# Patient Record
Sex: Female | Born: 1960
Health system: Southern US, Community
[De-identification: ages and names within clinical notes are randomized; demographics above are authoritative.]

## PROBLEM LIST (undated history)

## (undated) DIAGNOSIS — R519 Headache, unspecified: Secondary | ICD-10-CM

## (undated) DIAGNOSIS — R51 Headache: Secondary | ICD-10-CM

## (undated) DIAGNOSIS — J40 Bronchitis, not specified as acute or chronic: Secondary | ICD-10-CM

## (undated) DIAGNOSIS — T4145XA Adverse effect of unspecified anesthetic, initial encounter: Secondary | ICD-10-CM

## (undated) DIAGNOSIS — M109 Gout, unspecified: Secondary | ICD-10-CM

## (undated) DIAGNOSIS — IMO0001 Reserved for inherently not codable concepts without codable children: Secondary | ICD-10-CM

## (undated) DIAGNOSIS — K219 Gastro-esophageal reflux disease without esophagitis: Secondary | ICD-10-CM

## (undated) DIAGNOSIS — I10 Essential (primary) hypertension: Secondary | ICD-10-CM

## (undated) DIAGNOSIS — J3089 Other allergic rhinitis: Secondary | ICD-10-CM

## (undated) DIAGNOSIS — T8859XA Other complications of anesthesia, initial encounter: Secondary | ICD-10-CM

## (undated) DIAGNOSIS — M1990 Unspecified osteoarthritis, unspecified site: Secondary | ICD-10-CM

## (undated) DIAGNOSIS — R011 Cardiac murmur, unspecified: Secondary | ICD-10-CM

## (undated) HISTORY — DX: Unspecified osteoarthritis, unspecified site: M19.90

## (undated) HISTORY — PX: ABDOMINAL HYSTERECTOMY: SHX81

## (undated) HISTORY — PX: CYST REMOVAL HAND: SHX6279

## (undated) HISTORY — PX: NASAL SINUS SURGERY: SHX719

## (undated) HISTORY — DX: Gout, unspecified: M10.9

---

## 2000-04-21 ENCOUNTER — Other Ambulatory Visit: Admission: RE | Admit: 2000-04-21 | Discharge: 2000-04-21 | Payer: Self-pay | Admitting: Obstetrics

## 2000-04-21 ENCOUNTER — Encounter (INDEPENDENT_AMBULATORY_CARE_PROVIDER_SITE_OTHER): Payer: Self-pay | Admitting: Specialist

## 2000-04-30 ENCOUNTER — Ambulatory Visit (HOSPITAL_COMMUNITY): Admission: RE | Admit: 2000-04-30 | Discharge: 2000-04-30 | Payer: Self-pay | Admitting: Obstetrics

## 2000-04-30 ENCOUNTER — Encounter: Payer: Self-pay | Admitting: Obstetrics

## 2000-05-05 ENCOUNTER — Encounter: Payer: Self-pay | Admitting: Obstetrics

## 2000-05-05 ENCOUNTER — Encounter: Admission: RE | Admit: 2000-05-05 | Discharge: 2000-05-05 | Payer: Self-pay | Admitting: Obstetrics

## 2000-12-10 ENCOUNTER — Encounter: Payer: Self-pay | Admitting: Unknown Physician Specialty

## 2000-12-10 ENCOUNTER — Encounter: Admission: RE | Admit: 2000-12-10 | Discharge: 2000-12-10 | Payer: Self-pay | Admitting: Unknown Physician Specialty

## 2001-05-06 ENCOUNTER — Ambulatory Visit (HOSPITAL_COMMUNITY): Admission: RE | Admit: 2001-05-06 | Discharge: 2001-05-06 | Payer: Self-pay | Admitting: Unknown Physician Specialty

## 2001-05-06 ENCOUNTER — Encounter: Payer: Self-pay | Admitting: Unknown Physician Specialty

## 2002-04-13 ENCOUNTER — Encounter: Admission: RE | Admit: 2002-04-13 | Discharge: 2002-04-13 | Payer: Self-pay | Admitting: Unknown Physician Specialty

## 2002-04-13 ENCOUNTER — Encounter: Payer: Self-pay | Admitting: Unknown Physician Specialty

## 2007-05-21 ENCOUNTER — Encounter: Admission: RE | Admit: 2007-05-21 | Discharge: 2007-05-21 | Payer: Self-pay | Admitting: Otolaryngology

## 2007-06-08 ENCOUNTER — Ambulatory Visit (HOSPITAL_BASED_OUTPATIENT_CLINIC_OR_DEPARTMENT_OTHER): Admission: RE | Admit: 2007-06-08 | Discharge: 2007-06-08 | Payer: Self-pay | Admitting: Otolaryngology

## 2007-06-08 ENCOUNTER — Encounter (INDEPENDENT_AMBULATORY_CARE_PROVIDER_SITE_OTHER): Payer: Self-pay | Admitting: Otolaryngology

## 2009-07-16 ENCOUNTER — Emergency Department (HOSPITAL_COMMUNITY): Admission: EM | Admit: 2009-07-16 | Discharge: 2009-07-16 | Payer: Self-pay | Admitting: Emergency Medicine

## 2010-07-29 ENCOUNTER — Inpatient Hospital Stay (HOSPITAL_COMMUNITY)
Admission: AD | Admit: 2010-07-29 | Discharge: 2010-07-29 | Payer: Self-pay | Source: Home / Self Care | Attending: Obstetrics & Gynecology | Admitting: Obstetrics & Gynecology

## 2010-08-01 ENCOUNTER — Ambulatory Visit
Admission: RE | Admit: 2010-08-01 | Discharge: 2010-08-01 | Payer: Self-pay | Source: Home / Self Care | Attending: Obstetrics & Gynecology | Admitting: Obstetrics & Gynecology

## 2010-08-05 LAB — CBC
HCT: 30.2 % — ABNORMAL LOW (ref 36.0–46.0)
Hemoglobin: 10 g/dL — ABNORMAL LOW (ref 12.0–15.0)
MCH: 29.5 pg (ref 26.0–34.0)
MCHC: 33.1 g/dL (ref 30.0–36.0)
MCV: 89.1 fL (ref 78.0–100.0)
Platelets: 268 10*3/uL (ref 150–400)
RBC: 3.39 MIL/uL — ABNORMAL LOW (ref 3.87–5.11)
RDW: 12.7 % (ref 11.5–15.5)
WBC: 8.2 10*3/uL (ref 4.0–10.5)

## 2010-08-05 LAB — WET PREP, GENITAL
Clue Cells Wet Prep HPF POC: NONE SEEN
Trich, Wet Prep: NONE SEEN
Yeast Wet Prep HPF POC: NONE SEEN

## 2010-08-05 LAB — POCT PREGNANCY, URINE: Preg Test, Ur: NEGATIVE

## 2010-08-05 LAB — SAMPLE TO BLOOD BANK

## 2010-08-05 LAB — GC/CHLAMYDIA PROBE AMP, GENITAL
Chlamydia, DNA Probe: NEGATIVE
GC Probe Amp, Genital: NEGATIVE

## 2010-08-21 ENCOUNTER — Ambulatory Visit: Payer: BC Managed Care – PPO | Admitting: Obstetrics & Gynecology

## 2010-08-21 ENCOUNTER — Ambulatory Visit: Admit: 2010-08-21 | Payer: Self-pay | Admitting: Obstetrics & Gynecology

## 2010-08-21 DIAGNOSIS — D259 Leiomyoma of uterus, unspecified: Secondary | ICD-10-CM

## 2010-10-01 ENCOUNTER — Encounter (HOSPITAL_COMMUNITY)
Admission: RE | Admit: 2010-10-01 | Discharge: 2010-10-01 | Disposition: A | Discharge status: Home / Self Care | Payer: BC Managed Care – PPO | Source: Ambulatory Visit | Attending: Obstetrics & Gynecology | Admitting: Obstetrics & Gynecology

## 2010-10-01 LAB — SURGICAL PCR SCREEN: Staphylococcus aureus: POSITIVE — AB

## 2010-10-01 LAB — CBC
HCT: 35.8 % — ABNORMAL LOW (ref 36.0–46.0)
Hemoglobin: 10.8 g/dL — ABNORMAL LOW (ref 12.0–15.0)
MCH: 24.5 pg — ABNORMAL LOW (ref 26.0–34.0)
MCHC: 30.2 g/dL (ref 30.0–36.0)
MCV: 81.4 fL (ref 78.0–100.0)
Platelets: 308 10*3/uL (ref 150–400)
RBC: 4.4 MIL/uL (ref 3.87–5.11)
RDW: 16.8 % — ABNORMAL HIGH (ref 11.5–15.5)
WBC: 8.1 10*3/uL (ref 4.0–10.5)

## 2010-10-01 LAB — BASIC METABOLIC PANEL
BUN: 11 mg/dL (ref 6–23)
CO2: 29 mEq/L (ref 19–32)
Calcium: 9.2 mg/dL (ref 8.4–10.5)
Chloride: 102 mEq/L (ref 96–112)
Creatinine, Ser: 0.55 mg/dL (ref 0.4–1.2)
GFR calc Af Amer: >60 mL/min (ref >60)
GFR calc non Af Amer: >60 mL/min (ref >60)
Glucose, Bld: 90 mg/dL (ref 70–99)
Potassium: 3.1 mEq/L — ABNORMAL LOW (ref 3.5–5.1)
Sodium: 137 mEq/L (ref 135–145)

## 2010-10-08 ENCOUNTER — Inpatient Hospital Stay (HOSPITAL_COMMUNITY)
Admission: RE | Admit: 2010-10-08 | Discharge: 2010-10-10 | DRG: 359 | Disposition: A | Discharge status: Home / Self Care | Payer: BC Managed Care – PPO | Source: Ambulatory Visit | Attending: Obstetrics & Gynecology | Admitting: Obstetrics & Gynecology

## 2010-10-08 ENCOUNTER — Other Ambulatory Visit: Payer: Self-pay | Admitting: Obstetrics & Gynecology

## 2010-10-08 DIAGNOSIS — E669 Obesity, unspecified: Secondary | ICD-10-CM | POA: Diagnosis present

## 2010-10-08 DIAGNOSIS — N920 Excessive and frequent menstruation with regular cycle: Secondary | ICD-10-CM | POA: Diagnosis present

## 2010-10-08 DIAGNOSIS — D250 Submucous leiomyoma of uterus: Principal | ICD-10-CM | POA: Diagnosis present

## 2010-10-08 DIAGNOSIS — I10 Essential (primary) hypertension: Secondary | ICD-10-CM | POA: Diagnosis present

## 2010-10-08 DIAGNOSIS — Z01812 Encounter for preprocedural laboratory examination: Secondary | ICD-10-CM

## 2010-10-08 DIAGNOSIS — D279 Benign neoplasm of unspecified ovary: Secondary | ICD-10-CM | POA: Diagnosis present

## 2010-10-08 DIAGNOSIS — Z01818 Encounter for other preprocedural examination: Secondary | ICD-10-CM

## 2010-10-08 LAB — ABO/RH: ABO/RH(D): B NEG

## 2010-10-08 LAB — PREGNANCY, URINE: Preg Test, Ur: NEGATIVE

## 2010-10-09 LAB — CBC
HCT: 34.1 % — ABNORMAL LOW (ref 36.0–46.0)
Hemoglobin: 10.7 g/dL — ABNORMAL LOW (ref 12.0–15.0)
MCH: 24.9 pg — ABNORMAL LOW (ref 26.0–34.0)
MCHC: 31.4 g/dL (ref 30.0–36.0)
MCV: 79.3 fL (ref 78.0–100.0)
Platelets: 322 10*3/uL (ref 150–400)
RBC: 4.3 MIL/uL (ref 3.87–5.11)
RDW: 17.1 % — ABNORMAL HIGH (ref 11.5–15.5)
WBC: 11.8 10*3/uL — ABNORMAL HIGH (ref 4.0–10.5)

## 2010-10-11 LAB — CROSSMATCH
ABO/RH(D): B NEG
Antibody Screen: NEGATIVE
Unit division: 0
Unit division: 0

## 2010-10-11 NOTE — H&P (Signed)
[name], [name] NO.:  [id number]  MEDICAL RECORD NO.:  [id number]           PATIENT TYPE:  LOCATION:  [facility]                     FACILITY:  PHYSICIAN:  Scheryl Darter, MD       DATE OF BIRTH:  [birth year]  DATE OF SERVICE:                          PRE-OP HISTORY & PHYSICAL  The patient returns for her results of her biopsy and Pap test.  The patient is a 50 year old black female gravida 2, para 2-0-0-2, status post tubal ligation.  Last menstrual period, July 29, 2010.  The patient was seen in the MAU due to heavy bleeding.  She had light bleeding throughout December.  The patient has sensation of a mass in her lower abdomen.  Gynecologic exam little over a year ago, did not reveal any pelvic masses.  PAST MEDICAL HISTORY: 1. Hypertension. 2. Obesity.  PAST SURGICAL HISTORY:  Tubal ligation.  SOCIAL HISTORY:  The patient is a smoker.  She denies alcohol or drug use.  FAMILY HISTORY:  Hypertension and cancer.  Allergy to PENICILLIN.  MEDICATIONS: 1. Hydrochlorothiazide 25 mg a day. 2. Norvasc 10 mg a day. 3. Multivitamin.  REVIEW OF SYSTEMS:  Her bleeding is almost stopped.  She has a mass and some pain in her lower abdomen.  No nausea and vomiting, constipation or diarrhea, fever or chills.  PHYSICAL EXAMINATION:  GENERAL:  The patient in no acute distress. Normal affect. VITAL SIGNS:  Weight is 247 pounds, height 5 feet 3 inches, blood pressure 126/77, pulse 99. ABDOMEN:  Obese, soft with a firm mass up to the umbilicus consistent with fibroid uterus. EXTREMITIES:  Show some mild swelling. PELVIC:  On August 01, 2010, showed normal external genitalia and some light-to-moderate blood.  Cervix was displaced anteriorly. Pelvic exam revealed a firm mass consistent with fibroid uterus about 18-20 weeks size.  Endometrial biopsy was performed with the uterus sounding to greater than 12 cm.  Pap test done that day was normal.  The  endometrial biopsy revealed benign fragments of proliferative endometrium.  Ultrasound on July 29, 2010, showed endometrial thickening and 28 mm with a solid mass anteriorly in the myometrium measuring 16 x 12 x 12.7 cm and overall uterine measurements 20.5 x 13.6 x 16 cm.  IMPRESSION:  Dysfunctional uterine bleeding and fibroid uterus.  PLAN:  Due to the size of her fibroids, history of dysfunctional bleeding, I  recommend total abdominal hysterectomy.  I offered bilateral salpingo-oophorectomy at the same time and explained the risk and benefits of this.  She accepts TAH-BSO.  The risk of anesthesia, bleeding, infection, bowel or urinary tract damage and pain were discussed.  I also discussed the risk that she would have continued bleeding problems since surgery has not done.  Less invasive procedures were also discussed.  The patient will be scheduled for surgery in this hospital.  Questions were answered.     Scheryl Darter, MD    JA/MEDQ  D:  08/21/2010  T:  08/22/2010  Job:  [id number]

## 2010-10-14 DIAGNOSIS — Z09 Encounter for follow-up examination after completed treatment for conditions other than malignant neoplasm: Secondary | ICD-10-CM

## 2010-10-21 NOTE — Op Note (Signed)
NAMESHERELLE, CASTELLI                 ACCOUNT NO.:  [id number]  MEDICAL RECORD NO.:  [id number]           PATIENT TYPE:  I  LOCATION:  9320                          FACILITY:  [facility]  PHYSICIAN:  Scheryl Darter, MD       DATE OF BIRTH:  1961-02-27  DATE OF PROCEDURE:  10/08/2010 DATE OF DISCHARGE:                              OPERATIVE REPORT   PROCEDURE:  Total abdominal hysterectomy and bilateral salpingo- oophorectomy.  PREOPERATIVE DIAGNOSIS:  Large fibroid uterus with menorrhagia.  POSTOPERATIVE DIAGNOSIS:  Large fibroid uterus with menorrhagia.  SURGEON:  Scheryl Darter, MD  ASSISTANT:  Lesly Dukes, MD  ANESTHESIA:  General.  ESTIMATED BLOOD LOSS:  700 mL.  SPECIMENS:  Uterus, tubes, and ovaries.  Infant weight was 1532 g.  URINE OUTPUT:  250 mL intraoperatively.  DRAINS:  Foley catheter.  COMPLICATIONS:  None.  COUNTS:  Correct.  OPERATIVE COURSE:  The patient gave written consent for total abdominal hysterectomy and bilateral salpingo-oophorectomy due to symptomatic fibroid uterus with menorrhagia.  The patient identification was confirmed.  She was brought to the OR and general anesthesia was induced.  She was placed in dorsal supine position.  Exam under anesthesia revealed large pelvic mass consistent with fibroid near the umbilicus.  Abdomen was sterilely prepped and draped and Foley catheter was placed.  A #10 blade was used to make a vertical skin incision from the umbilicus to the symphysis pubis.  Incision was carried down to the fascia and the fascia was incised.  Incision was carried vertically. Underlying peritoneum was identified and entered and incision was extended vertically with Metzenbaum scissors.  Large fibroid uterus was confirmed.  The tubes and ovaries appeared normal.  The valve was packed back and the self-retaining retractor was placed.  The left round ligament was elevated and suture ligated and incised.  Anterior leaf of the broad  ligament was opened.  The infundibulopelvic ligament was isolated.  Heaney clamp was placed proximal to the pelvic ligament after opening the posterior leaf of the broad ligament.  Double ligatures with 0-Vicryl were applied after incising the pedicle.  Hemostasis was obtained with 0-Vicryl suture.  The right round ligament was likewise elevated and suture ligated and incised and bladder flap was created. Bladder was mobilized.  The posterior leaf of the broad ligament was opened and the infundibulopelvic ligament was isolated and clamped and cut and double suture ligatures were placed.  The uterine vessels were skeletonized.  These were clamped with Heaney clamps, cut, and suture ligated with 0-Vicryl.  There was backbleeding from the large uterine fibroid during this stage of the procedure.  The cardinal ligaments were then clamped, cut, and suture ligated and the bladder was further mobilized.  The uterosacral ligaments and the vaginal angles were clamped with curved Heaney and the cervix was amputated with a knife. At this point, Heaney clamps could be placed below the cervix and the cervix was excised from the upper vagina.  0-Vicryl sutures were placed at the angles.  The vaginal cuff was closed with running locking suture of 0-Vicryl.  Good hemostasis was seen.  Pelvis was irrigated.  Both ureters were seen to be peristalsing normally.  All instruments and packs were removed.  Anterior peritoneum was closed with a running suture with 2-0 Vicryl.  The fascia was closed with running suture of 0- Vicryl.  The skin incision was irrigated.  There was about 4 cm of adipose tissue.  The subcutaneous layer was closed with a running suture with 2-0 plain gut.  The skin was closed with staples.  Sterile dressing was applied.  The patient tolerated the procedure well without complications.  Estimated blood loss was 700 mL.  The patient was brought in to stable condition to recovery room.   There was clear urine draining from the Foley catheter.     Scheryl Darter, MD     JA/MEDQ  D:  10/08/2010  T:  10/09/2010  Job:  [id number]  Electronically Signed by Scheryl Darter MD on 10/21/2010 11:36:05 AM

## 2010-10-21 NOTE — Discharge Summary (Signed)
  [name], [name]                 ACCOUNT NO.:  [id number]  MEDICAL RECORD NO.:  [id number]           PATIENT TYPE:  I  LOCATION:  9320                          [facility]ILITY[facility]  [facility]  PHYSICIAN:  Scheryl Darter, MD       DATE OF BIRTH:  Jul 24, 1960  DATE OF ADMISSION:  10/08/2010 DATE OF DISCHARGE:  10/10/2010                              DISCHARGE SUMMARY   DIAGNOSIS:  Symptomatic fibroid uterus.  PROCEDURE:  On October 08, 2010, was total abdominal hysterectomy with bilateral salpingo-oophorectomy.  The patient is a 50 year old black female gravida 2, para 2, status post tubal ligation with history of fibroid uterus.  The patient has a history of heavy menstrual bleeding. She was admitted for total abdominal hysterectomy and bilateral salpingo- oophorectomy.  PAST MEDICAL HISTORY:  Hypertension and obesity.  PAST SURGICAL HISTORY:  Tubal ligation.  SOCIAL HISTORY:  The patient is married.  She denies alcohol, tobacco, or drug use.  ALLERGIES:  She has a PENICILLIN allergy.  MEDICATIONS:  Hydrochlorothiazide 25 mg a day, Norvasc 10 mg a day, and multivitamin.  PHYSICAL EXAMINATION:  VITAL SIGNS:  Weight 247 pounds, height 5 feet 3 inches, BP 126/77. CHEST:  Clear. HEART:  Regular rate and rhythm. ABDOMEN:  Obese with a firm mass to the umbilicus.  The mass was confirmed on pelvic examination.  The ultrasound showed large fibroid.  The patient was admitted for planned surgery.  Procedure was completed without complications on October 08, 2010.  The patient was found to have positive screen for methicillin-resistant staph aureus.  She was afebrile after surgery.  Preop hemoglobin was 10.8 and postop was 10.7. Second postoperative day, she was discharged home.  She was given prescription for Percocet 5/325 one to two p.o. q.4-6 hours p.r.n. pain. She is instructed to have pelvic rest, no heavy lifting.  She is not to drive for at least 2 weeks.  Instructed to return in 4 days to  remove her staples, and she is to have a postoperative visit at [facility] in 4 weeks.    Scheryl Darter, MD    JA/MEDQ  D:  10/10/2010  T:  10/11/2010  Job:  [id number]  Electronically Signed by Scheryl Darter MD on 10/21/2010 11:35:55 AM

## 2010-11-06 ENCOUNTER — Ambulatory Visit: Payer: BC Managed Care – PPO | Admitting: Obstetrics & Gynecology

## 2010-11-06 DIAGNOSIS — Z09 Encounter for follow-up examination after completed treatment for conditions other than malignant neoplasm: Secondary | ICD-10-CM

## 2010-11-07 NOTE — Group Therapy Note (Signed)
NAMELATIANA, TOMEI NO.:  [id number]  MEDICAL RECORD NO.:  [id number]           PATIENT TYPE:  [facility]ATION:  [facility]                     FACILITY:  PHYSICIAN:  Scheryl Darter, MD       DATE OF BIRTH:  [birth year]  DATE OF SERVICE:                                 CLINIC NOTE  The patient returns postoperative from a total abdominal hysterectomy and bilateral salpingo-oophorectomy that was performed on October 08, 2010.  She had a large fibroid uterus with benign pathology.  She says she does have some vasomotor symptoms and sweats.  She questions whether she would benefit from hormone replacement therapy.  PAST MEDICAL HISTORY: 1. Hypertension. 2. Obesity.  PHYSICAL EXAMINATION:  VITAL SIGNS:  Blood pressure is 151/102 and 143/94, temperature 97.8. ABDOMEN:  Obese, soft, well-healed vertical midline incision, nontender. No masses. PELVIC:  External genitalia, vagina, and vaginal cuff were well supported with good healing of the cuff.  No masses or tenderness.  IMPRESSION:  The patient is doing well 1 month postoperative from hysterectomy.  She is having vasomotor symptoms.  PLAN:  She can return to normal activities and start working on Nov 19, 2010.  I gave a prescription for Estrace 1 mg p.o. daily.  She should come for yearly exams and have breast exams and mammograms.  She should continue with her antihypertensive medications.     Scheryl Darter, MD    JA/MEDQ  D:  11/06/2010  T:  11/07/2010  Job:  [id number]

## 2010-12-03 NOTE — Assessment & Plan Note (Signed)
NAMEHAYDEN, Grant NO.:  [id number]   MEDICAL RECORD NO.:  [id number]          PATIENT TYPE:  POB   LOCATION:  [facility]         [facility]ACILITY:  [facility]   PHYSICIAN:  Scheryl Darter, MD       DATE OF BIRTH:  05/12/1961   DATE OF SERVICE:                                  CLINIC NOTE   The patient is referred from the MAU at [facility] due to heavy  bleeding and fibroid uterus.   The patient is a 50 year old black female gravida 2, para 0-0-2, status  post tubal ligation.  She presented to MAU on July 29, 2010, due to  heavy bleeding with clots and tissue for 2 days.  She was changing pad 4-  6 times a day.  Denied any pain or cramping.  She has had light bleeding  all of December.  She has heavier bleeding for about 2 weeks.  She has  noticed sensation of a mass in her lower abdomen for the past several  months.  She had last been seen for gynecologic exam little over a year  ago and she had a Pap test done, states that no pelvic mass was detected  at that time.   PAST MEDICAL HISTORY:  1. Hypertension.  2. Obesity.   PAST SURGICAL HISTORY:  Tubal ligation.   SOCIAL HISTORY:  The patient denies drug or alcohol use.  She is a  smoker.   Family history of hypertension and cancer.   He has allergies to PENICILLIN.   MEDICATIONS:  1. Hydrochlorothiazide 25 mg a day.  2. Norvasc 10 mg one p.o. daily.  3. She also takes a multivitamin.   REVIEW OF SYSTEMS:  She still has some bleeding, but it is decreased  some.  Occasional pain since she has some pressure and mass effect in  her lower abdomen.  No nausea and vomiting.  No diarrhea or  constipation.  No fever, chills.   PHYSICAL EXAMINATION:  GENERAL:  The patient in no acute distress.  Normal affect.  VITAL SIGNS:  Weight is 244 pounds, height 5 feet 3 inches, blood  pressure 137/91, pulse 94.  ABDOMEN:  Obese and nontender with a firm mass palpable above the  umbilicus consistent with  fibroid uterus.  EXTREMITIES:  Show some mild swelling.  PELVIC:  External genitalia appeared normal.  The patient has light to  moderate blood.  No clots.  Cervix was displaced anteriorly.  Pelvic  exam reveals pelvic masses described nontender.  Pap smear was obtained.  The patient gave consent for endometrial biopsy.   Procedure was explained.  The risk of pain, bleeding, infection, or  uterine damage were discussed.  She signed consent and time-out was  performed.  My last sampler was passed to greater than 12 cm and bloody  material was obtained.  This was sent to Pathology.  The patient had  ultrasound on July 29, 2010, which showed an endometrial thickening up  to 28 mm.  There was a solid mass anteriorly in the myometrium measuring  16 x 12 x 12.7 cm.  The overall uterine measurements were 20.5 x 13.2 x  16 cm.   IMPRESSION:  Dysfunctional uterine bleeding with fibroid uterus  described on ultrasound.  There was endometrial thickening.   PLAN:  We will review the results of her Pap smear and endometrial  biopsy at the next visit in 2 weeks.  I will discuss the probable  scheduling for total abdominal hysterectomy, bilateral salpingo-  oophorectomy.  Discussed the procedure and the risks.      Scheryl Darter, MD     JA/MEDQ  D:  08/01/2010  T:  08/02/2010  Job:  [id number]

## 2010-12-03 NOTE — Op Note (Signed)
NAMEJAILYNNE, [name]                 ACCOUNT NO.:  [id number]   MEDICAL RECORD NO.:  [id number]          PATIENT TYPE:  AMB   LOCATION:  [facility]                          FACILITY:  [facility]   PHYSICIAN:  Christopher E. Ezzard Standing, M.D.DATE OF BIRTH:  January 08, 1961   DATE OF PROCEDURE:  06/08/2007  DATE OF DISCHARGE:                               OPERATIVE REPORT   PREOPERATIVE DIAGNOSIS:  Sinonasal polyps, right side worse than the  left, with nasal obstruction and decreased sense of smell.   POSTOPERATIVE DIAGNOSIS:  Sinonasal polyps, right side worse than the  left, with nasal obstruction and decreased sense of smell, findings  consistent with chronic fungal sinusitis on the right side.   OPERATION PERFORMED:  Functional endoscopic sinus surgery with right  total ethmoidectomy with removal of polyps, right maxillary ostial  enlargement, removal of polyps and fungal disease, right sphenoidotomy  with removal of polyps and fungal disease.  Left posterior ethmoidectomy  with removal of polyps.   SURGEON:  Kristine Garbe. Ezzard Standing, M.D.   ANESTHESIA:  General endotracheal.   COMPLICATIONS:  None.   BRIEF CLINICAL NOTE:  [name] is a 50 year old female who has had a  long history of allergies and nasal obstruction.  Over the last several  months, she has had more trouble breathing through the right side of the  nose and has decreased sense of smell over the last year.  She underwent  a CT scan which shows complete opacification of the right ethmoid,  maxillary, frontal and sphenoid sinuses.  She has some partial  opacification of the left posterior ethmoid sphenoid region where she  has some polyps extruding from the sphenoid region posteriorly on the  left side.  She is taken to the operating room at this time for  functional endoscopic sinus surgery with removal of polypoid disease.  Findings of CT scan are suggestive of fungal polypoid sinusitis.   DESCRIPTION OF PROCEDURE:  After adequate  endotracheal anesthesia, [name]  received 10 mg Decadron and 1 gram Ancef IV preoperatively.  Her nose  was then further prepped with cotton pledgets soaked in Afrin and the  middle meatus area was injected with Xylocaine with epinephrine for  hemostasis.  The right nasal passageway was completely obstructed with  polypoid disease.  The left side was fairly clear anteriorly.  Using the  microdebrider, the polypoid disease was removed from the right side.  The polyps extended throughout the entire ethmoid region.  There was  some fungal debris with the consistency of thick peanut butter within  the ethmoid and maxillary sinuses as well as some back in the sphenoid  region.  The maxillary sinus was completely obstructed with this debris  as well as some of the superior ethmoid region.  Maxillary ostial  enlargement was performed with straight through cup forceps and back  cutting forceps.  In order to remove all the debris from the maxillary  sinus, it was copiously irrigated with saline and all the fungal debris  that could be visualized with the 70 degree scope was removed.  Likewise, the fungal debris was removed  from the roof of the ethmoid  area as well as the sphenoid sinus posteriorly.  The areas were then  irrigated with saline.  Care was taken to preserve the lamina papyracea  as well as the roof of the ethmoid.  On the left side, on review of the  CT scan, the anterior ethmoid, maxillary, and frontal sinuses were  clear.  The left sphenoid sinus was completely opacified but this was  actually approached through the large right sphenoidotomy.  On the left  side, the posterior ethmoid area was opened up by making an incision  medially through the ethmoid area and some of the polypoid disease was  removed with a microdebrider on the left side.  A limited posterior  ethmoidectomy and removal of polypoid disease was performed on the left  side.  Hemostasis was obtained with suction  cautery.  After obtaining  adequate hemostasis on the right side, a Kennedy sinus pack was placed  in the right middle meatus area and hydrated with Xylocaine with  epinephrine.  No pack was placed in the left side.  The inferior  turbinates in the posterior left ethmoid area was then injected with 40  mg of Depo-Medrol on each side.  This completed the procedure.  [name] was  awakened from anesthesia and transferred to the recovery room  postoperatively doing well.   DISPOSITION:  [name] was discharged home later this morning on Keflex 500  mg b.i.d. for ten days, Tylenol, Vicodin p.r.n. pain.  She is to follow  up in my office in three for recheck and have the Kennedy sinus pack  removed.           ______________________________  Kristine Garbe Ezzard Standing, M.D.     CEN/MEDQ  D:  06/08/2007  T:  06/08/2007  Job:  [id number]

## 2011-04-29 LAB — BASIC METABOLIC PANEL
BUN: 9
CO2: 27
Calcium: 8.9
Chloride: 105
Creatinine, Ser: 0.73
GFR calc Af Amer: >60
GFR calc non Af Amer: >60
Glucose, Bld: 88
Potassium: 3.4 — ABNORMAL LOW
Sodium: 138

## 2011-04-29 LAB — POCT HEMOGLOBIN-HEMACUE
Hemoglobin: 12.5
Operator id: 112821

## 2013-05-02 ENCOUNTER — Other Ambulatory Visit: Payer: Self-pay | Admitting: Gastroenterology

## 2013-11-22 ENCOUNTER — Ambulatory Visit: Payer: BC Managed Care – PPO | Admitting: Cardiovascular Disease

## 2014-01-15 ENCOUNTER — Emergency Department (HOSPITAL_COMMUNITY)
Admission: EM | Admit: 2014-01-15 | Discharge: 2014-01-15 | Disposition: A | Discharge status: Home / Self Care | Payer: BC Managed Care – PPO | Attending: Emergency Medicine | Admitting: Emergency Medicine

## 2014-01-15 ENCOUNTER — Encounter (HOSPITAL_COMMUNITY): Payer: Self-pay | Admitting: Emergency Medicine

## 2014-01-15 DIAGNOSIS — I10 Essential (primary) hypertension: Secondary | ICD-10-CM | POA: Insufficient documentation

## 2014-01-15 DIAGNOSIS — Z880 Allergy status to penicillin: Secondary | ICD-10-CM | POA: Insufficient documentation

## 2014-01-15 DIAGNOSIS — L02219 Cutaneous abscess of trunk, unspecified: Secondary | ICD-10-CM | POA: Insufficient documentation

## 2014-01-15 DIAGNOSIS — L0291 Cutaneous abscess, unspecified: Secondary | ICD-10-CM

## 2014-01-15 DIAGNOSIS — Z87891 Personal history of nicotine dependence: Secondary | ICD-10-CM | POA: Insufficient documentation

## 2014-01-15 DIAGNOSIS — L03319 Cellulitis of trunk, unspecified: Principal | ICD-10-CM

## 2014-01-15 DIAGNOSIS — Z8709 Personal history of other diseases of the respiratory system: Secondary | ICD-10-CM | POA: Insufficient documentation

## 2014-01-15 HISTORY — DX: Essential (primary) hypertension: I10

## 2014-01-15 HISTORY — DX: Bronchitis, not specified as acute or chronic: J40

## 2014-01-15 NOTE — ED Notes (Signed)
Patient is alert and oriented x3.  She is complaining of a cyst like area on the mid upper back. She states that it has been manipulated for the last week and that a creamy drainage came out. Currently she rates her pain 5 of 10.

## 2014-01-15 NOTE — ED Provider Notes (Signed)
CSN: [id number]     Arrival date & time 01/15/14  1301 History  This chart was scribed for non-physician practitioner, Monico Blitz, PA-C,working with Fredia Sorrow, MD, by Marlowe Kays, ED Scribe.  This patient was seen in room WTR6/WTR6 and the patient's care was started at 2:42 PM.   Chief Complaint  Patient presents with  . Cyst    mid back   The history is provided by the patient. No language interpreter was used.   HPI Comments:  [name] is a 53 y.o. obese female with h/o HTN who presents to the Emergency Department complaining of a worsening abscess on her back that has been there for approximately one month. Pt reports the area has grown in size. She states she has had her sister squeeze it and has produced pus. Pt reports that lying on the area makes the pain worse. She denies fever, nausea, and chills. She reports allergy to PCN.  Past Medical History  Diagnosis Date  . Hypertension   . Bronchitis    Past Surgical History  Procedure Laterality Date  . Abdominal hysterectomy    . Nasal sinus surgery    . Cyst removal hand     No family history on file. History  Substance Use Topics  . Smoking status: Former Research scientist (life sciences)  . Smokeless tobacco: Not on file  . Alcohol Use: No   OB History   Grav Para Term Preterm Abortions TAB SAB Ect Mult Living                 Review of Systems A complete 10 system review of systems was obtained and all systems are negative except as noted in the HPI and PMH.   Allergies  Penicillins  Home Medications   Prior to Admission medications   Not on File   Triage Vitals: BP 165/78  Pulse 89  Temp(Src) 98.3 F (36.8 C) (Oral)  Resp 20  Ht 5\' 3"  (1.6 m)  Wt 280 lb (127.007 kg)  BMI 49.61 kg/m2  SpO2 97% Physical Exam  Nursing note and vitals reviewed. Constitutional: She is oriented to person, place, and time. She appears well-developed and well-nourished. No distress.  HENT:  Head: Normocephalic and atraumatic.   Eyes: Conjunctivae and EOM are normal.  Cardiovascular: Normal rate, regular rhythm and intact distal pulses.   Pulmonary/Chest: Effort normal and breath sounds normal. No stridor.  Abdominal: Soft.  Musculoskeletal: Normal range of motion.  Neurological: She is alert and oriented to person, place, and time.  Skin:     Psychiatric: She has a normal mood and affect.    ED Course  Procedures (including critical care time) DIAGNOSTIC STUDIES: Oxygen Saturation is 97% on RA, normal by my interpretation.   COORDINATION OF CARE: 2:46 PM- Will I & D abscess. Pt verbalizes understanding and agrees to plan.  INCISION AND DRAINAGE PROCEDURE NOTE: Patient identification was confirmed and verbal consent was obtained. This procedure was performed by Monico Blitz, PA-C at 3:07 PM. Site: upper back Sterile procedures observed Needle size: 25 G Anesthetic used (type and amt): Lidocaine 2% with Epinephrine (1.5 mL) Blade size: #11 Drainage: large, purulent Complexity: Complex Packing used: none Site anesthetized, incision made over site, wound drained and explored loculations, rinsed with copious amounts of normal saline, wound packed with sterile gauze, covered with dry, sterile dressing.  Pt tolerated procedure well without complications.  Instructions for care discussed verbally and pt provided with additional written instructions for homecare and f/u.  Medications - No data to display  Labs Review Labs Reviewed - No data to display  Imaging Review No results found.   EKG Interpretation None      MDM   Final diagnoses:  Abscess    Filed Vitals:   01/15/14 1309 01/15/14 1533  BP: 165/78 133/74  Pulse: 89 62  Temp: 98.3 F (36.8 C) 98.3 F (36.8 C)  TempSrc: Oral Oral  Resp: 20 20  Height: 5\' 3"  (1.6 m)   Weight: 280 lb (127.007 kg)   SpO2: 97% 95%    Medications - No data to display  [name] is a 53 y.o. female presenting with an abscess to  upper back, I and D. performed with significant amount of drainage. No surrounding cellulitis. No indication for antibiotics  Evaluation does not show pathology that would require ongoing emergent intervention or inpatient treatment. Pt is hemodynamically stable and mentating appropriately. Discussed findings and plan with patient/guardian, who agrees with care plan. All questions answered. Return precautions discussed and outpatient follow up given.   I personally performed the services described in this documentation, which was scribed in my presence. The recorded information has been reviewed and is accurate.    Monico Blitz, PA-C 01/15/14 1555

## 2014-01-15 NOTE — Discharge Instructions (Signed)
Do not hesitate to return to the Emergency Department for any new, worsening or concerning symptoms.   If you do not have a primary care doctor you can establish one at the   [facility]: [place] [id number] [phone]  After you establish care. Let them know you were seen in the emergency room. They must obtain records for further management.   If you see signs of infection (warmth, redness, tenderness, pus, sharp increase in pain, fever, red streaking) immediately return to the emergency department.  Abscess Care After An abscess (also called a boil or furuncle) is an infected area that contains a collection of pus. Signs and symptoms of an abscess include pain, tenderness, redness, or hardness, or you may feel a moveable soft area under your skin. An abscess can occur anywhere in the body. The infection may spread to surrounding tissues causing cellulitis. A cut (incision) by the surgeon was made over your abscess and the pus was drained out. Gauze may have been packed into the space to provide a drain that will allow the cavity to heal from the inside outwards. The boil may be painful for 5 to 7 days. Most people with a boil do not have high fevers. Your abscess, if seen early, may not have localized, and may not have been lanced. If not, another appointment may be required for this if it does not get better on its own or with medications. HOME CARE INSTRUCTIONS   Only take over-the-counter or prescription medicines for pain, discomfort, or fever as directed by your caregiver.  When you bathe, soak and then remove gauze or iodoform packs at least daily or as directed by your caregiver. You may then wash the wound gently with mild soapy water. Repack with gauze or do as your caregiver directs. SEEK IMMEDIATE MEDICAL CARE IF:   You develop increased pain, swelling, redness, drainage, or bleeding in the wound site.  You develop signs of generalized  infection including muscle aches, chills, fever, or a general ill feeling.  An oral temperature above 102 F (38.9 C) develops, not controlled by medication. See your caregiver for a recheck if you develop any of the symptoms described above. If medications (antibiotics) were prescribed, take them as directed. Document Released: 01/23/2005 Document Revised: 09/29/2011 Document Reviewed: 09/20/2007 [facility] Patient Information 2015 [place], [place]. This information is not intended to replace advice given to you by your health care provider. Make sure you discuss any questions you have with your health care provider.

## 2014-01-15 NOTE — ED Notes (Signed)
Pt states she noticed a painful raised area on her back @ 47month ago.  Pt states the area has gotten progressively larger and more painful over past 2 weeks.  Pt denies N/V and fever.  Pt has raised area mid-way down back between shoulder blades.  No drainage noted.  Pt states if she expresses area, she does get drainage.

## 2014-01-17 NOTE — ED Provider Notes (Signed)
Medical screening examination/treatment/procedure(s) were performed by non-physician practitioner and as supervising physician I was immediately available for consultation/collaboration.   EKG Interpretation None        Fredia Sorrow, MD 01/17/14 [id number]

## 2014-01-19 ENCOUNTER — Ambulatory Visit (INDEPENDENT_AMBULATORY_CARE_PROVIDER_SITE_OTHER): Payer: BC Managed Care – PPO | Admitting: Internal Medicine

## 2014-01-19 ENCOUNTER — Encounter: Payer: Self-pay | Admitting: Internal Medicine

## 2014-01-19 ENCOUNTER — Ambulatory Visit (INDEPENDENT_AMBULATORY_CARE_PROVIDER_SITE_OTHER)
Admission: RE | Admit: 2014-01-19 | Discharge: 2014-01-19 | Disposition: A | Discharge status: Home / Self Care | Payer: BC Managed Care – PPO | Source: Ambulatory Visit | Attending: Internal Medicine | Admitting: Internal Medicine

## 2014-01-19 ENCOUNTER — Other Ambulatory Visit (INDEPENDENT_AMBULATORY_CARE_PROVIDER_SITE_OTHER): Payer: BC Managed Care – PPO

## 2014-01-19 VITALS — BP 108/74 | HR 70 | Temp 98.0°F | Ht 63.0 in | Wt 291.0 lb

## 2014-01-19 DIAGNOSIS — R0600 Dyspnea, unspecified: Secondary | ICD-10-CM

## 2014-01-19 DIAGNOSIS — R0989 Other specified symptoms and signs involving the circulatory and respiratory systems: Secondary | ICD-10-CM

## 2014-01-19 DIAGNOSIS — R0609 Other forms of dyspnea: Secondary | ICD-10-CM

## 2014-01-19 DIAGNOSIS — I10 Essential (primary) hypertension: Secondary | ICD-10-CM | POA: Insufficient documentation

## 2014-01-19 LAB — BASIC METABOLIC PANEL
BUN: 12 mg/dL (ref 6–23)
CO2: 31 mEq/L (ref 19–32)
Calcium: 9.3 mg/dL (ref 8.4–10.5)
Chloride: 101 mEq/L (ref 96–112)
Creatinine, Ser: 0.8 mg/dL (ref 0.4–1.2)
GFR: 96.18 mL/min (ref >60.00)
Glucose, Bld: 111 mg/dL — ABNORMAL HIGH (ref 70–99)
Potassium: 3.5 mEq/L (ref 3.5–5.1)
Sodium: 139 mEq/L (ref 135–145)

## 2014-01-19 LAB — CBC WITH DIFFERENTIAL/PLATELET
Basophils Absolute: 0 10*3/uL (ref 0.0–0.1)
Basophils Relative: 0.3 % (ref 0.0–3.0)
Eosinophils Absolute: 0.3 10*3/uL (ref 0.0–0.7)
Eosinophils Relative: 3.3 % (ref 0.0–5.0)
HCT: 38.6 % (ref 36.0–46.0)
Hemoglobin: 13 g/dL (ref 12.0–15.0)
Lymphocytes Relative: 22.4 % (ref 12.0–46.0)
Lymphs Abs: 2 10*3/uL (ref 0.7–4.0)
MCHC: 33.7 g/dL (ref 30.0–36.0)
MCV: 86.3 fl (ref 78.0–100.0)
Monocytes Absolute: 0.6 10*3/uL (ref 0.1–1.0)
Monocytes Relative: 6.7 % (ref 3.0–12.0)
Neutro Abs: 5.9 10*3/uL (ref 1.4–7.7)
Neutrophils Relative %: 67.3 % (ref 43.0–77.0)
Platelets: 267 10*3/uL (ref 150.0–400.0)
RBC: 4.47 Mil/uL (ref 3.87–5.11)
RDW: 13.2 % (ref 11.5–15.5)
WBC: 8.8 10*3/uL (ref 4.0–10.5)

## 2014-01-19 LAB — BRAIN NATRIURETIC PEPTIDE: Pro B Natriuretic peptide (BNP): 64 pg/mL (ref 0.0–100.0)

## 2014-01-19 LAB — TSH: TSH: 1.61 u[IU]/mL (ref 0.35–4.50)

## 2014-01-19 MED ORDER — FAMOTIDINE 20 MG PO TABS: ORAL_TABLET | ORAL | Status: DC

## 2014-01-19 MED ORDER — PANTOPRAZOLE SODIUM 40 MG PO TBEC: 40.0000 mg | DELAYED_RELEASE_TABLET | Freq: Every day | ORAL | Status: DC

## 2014-01-19 MED ORDER — TELMISARTAN-HCTZ 80-12.5 MG PO TABS: 1.0000 | ORAL_TABLET | Freq: Every day | ORAL | Status: DC

## 2014-01-19 MED ORDER — METHYLPREDNISOLONE ACETATE 80 MG/ML IJ SUSP
80.0000 mg | Freq: Once | INTRAMUSCULAR | Status: AC
  Administered 2014-01-19: 80 mg via INTRAMUSCULAR

## 2014-01-19 MED ORDER — NEBIVOLOL HCL 5 MG PO TABS: ORAL_TABLET | ORAL | Status: DC

## 2014-01-19 NOTE — Patient Instructions (Signed)
Stop atenolol and norvasc (amlodipine) and your leg swelling should gradually improve   Start micardis 80/12.5 one daily  Start bystolic 5 mg 2 every 12 hours  Depomedrol 120 mg  Today should take care of any allergy contributing to your symptoms  Pantoprazole (protonix) 40 mg   Take 30-60 min before first meal of the day and Pepcid 20 mg one bedtime until return to office - this is the best way to tell whether stomach acid is contributing to your problem.    GERD (REFLUX)  is an extremely common cause of respiratory symptoms, many times with no significant heartburn at all.    It can be treated with medication, but also with lifestyle changes including avoidance of late meals, excessive alcohol, smoking cessation, and avoid fatty foods, chocolate, peppermint, colas, red wine, and acidic juices such as orange juice.  NO MINT OR MENTHOL PRODUCTS SO NO COUGH DROPS  USE SUGARLESS CANDY INSTEAD (jolley ranchers or Stover's)  NO OIL BASED VITAMINS - use powdered substitutes.    See Tammy NP w/in 2 weeks with all your medications and a list of your insurance companies preferred blood pressure medications ("formulary")

## 2014-01-19 NOTE — Progress Notes (Signed)
Subjective:    Patient ID: [name], female    DOB: 1961-01-05  MRN: [id number]  HPI  45 yobf quit smoking march 2015 with h/o allergies as child required shots and some inhalers 100% better maybe around HS then around mid 20's recurrent problems coughed attributed to sinus / bronchitis s/p sinus surgery 2004 by Lucia Gaskins some better then short of breath starting May 2015 self-referred to pulmonary clinic 01/19/2014    01/19/2014 1st [facility] office visit/ Linden Tagliaferro  Chief Complaint  Patient presents with  . Pulmonary Consult    Self referral.  Pt c/o SOB with or without exertion for approx 6 wks. She states that she is mainly SOB with exertion such as walking short distances such as from lobby to exam room this am.  She also c/o cough since "always"- prod with brown to clear, sometimes blood tinged sputum.    indolent onset sob/cough despite stopping smoking - only thing makes it better = stop moving  No obvious other patterns in day to day or daytime variabilty or assoc chronic cough or cp or chest tightness, subjective wheeze or overt sinus  symptoms. No unusual exp hx or h/o childhood pna/ asthma or knowledge of premature birth.  Sleeping ok without nocturnal  or early am exacerbation  of respiratory  c/o's or need for noct saba. Also denies any obvious fluctuation of symptoms with weather or environmental changes or other aggravating or alleviating factors except as outlined above   Current Medications, Allergies, Complete Past Medical History, Past Surgical History, Family History, and Social History were reviewed in [employer] record.              Review of Systems  Constitutional: Negative for fever, chills and unexpected weight change.  HENT: Positive for congestion and sneezing. Negative for dental problem, ear pain, nosebleeds, postnasal drip, rhinorrhea, sinus pressure, sore throat, trouble swallowing and voice change.   Eyes: Negative for visual  disturbance.  Respiratory: Positive for cough and shortness of breath. Negative for choking.   Cardiovascular: Negative for chest pain and leg swelling.  Gastrointestinal: Negative for vomiting, abdominal pain and diarrhea.  Genitourinary: Negative for difficulty urinating.       Acid heartburn  Indigestion  Musculoskeletal: Positive for arthralgias.  Skin: Positive for rash.  Neurological: Positive for headaches. Negative for tremors and syncope.  Hematological: Does not bruise/bleed easily.       Objective:   Physical Exam  Wt Readings from Last 3 Encounters:  01/19/14 291 lb (131.997 kg)  01/15/14 280 lb (127.007 kg)     amb bf laughs a lot  HEENT: nl dentition, turbinates, and orophanx. Nl external ear canals without cough reflex   NECK :  without JVD/Nodes/TM/ nl carotid upstrokes bilaterally   LUNGS: no acc muscle use, clear to A and P bilaterally without cough on insp or exp maneuvers   CV:  RRR  no s3 or murmur or increase in P2,  1+ pitting bilateral lower ext  edema   ABD:  soft and nontender with nl excursion in the supine position. No bruits or organomegaly, bowel sounds nl  MS:  warm without deformities, calf tenderness, cyanosis or clubbing  SKIN: warm and dry without lesions    NEURO:  alert, approp, no deficits        CXR  01/19/2014 :  No acute cardiopulmonary abnormality seen.    Recent Labs Lab 01/19/14 1018  NA 139  K 3.5  CL 101  CO2 31  BUN 12  CREATININE 0.8  GLUCOSE 111*    Recent Labs Lab 01/19/14 1018  HGB 13.0  HCT 38.6  WBC 8.8  PLT 267.0     Lab Results  Component Value Date   TSH 1.61 01/19/2014    Lab Results  Component Value Date   PROBNP 64.0 01/19/2014         Assessment & Plan:

## 2014-01-20 NOTE — Assessment & Plan Note (Signed)
Trial off amlodipine 01/19/2014 due to leg swelling and off atenolol due to ? Asthma  rx micardis 44/01.0 and bystolic 10 bid   F/u in 2 weeks

## 2014-01-20 NOTE — Assessment & Plan Note (Addendum)
-   01/19/2014  Walked RA x 3 laps @ 185 ft each stopped due to  End of study, no desats, some coughing  -  01/19/2014 nl spirometry except for [id number] ? Significance? rec > - trial off atenolol  01/19/2014   Symptoms are markedly disproportionate to objective findings and not clear this is a lung problem but pt does appear to have difficult airway management issues. DDX of  difficult airways management all start with A and  include Adherence, Ace Inhibitors, Acid Reflux, Active Sinus Disease, Alpha 1 Antitripsin deficiency, Anxiety masquerading as Airways dz,  ABPA,  allergy(esp in young), Aspiration (esp in elderly), Adverse effects of DPI,  Active smokers, plus two Bs  = Bronchiectasis and Beta blocker use..and one C= CHF  Adherence is always the initial "prime suspect" and is a multilayered concern that requires a "trust but verify" approach in every patient - starting with knowing how to use medications, especially inhalers, correctly, keeping up with refills and understanding the fundamental difference between maintenance and prns vs those medications only taken for a very short course and then stopped and not refilled.  - needs med rec >  To keep things simple, I have asked the patient to first separate medicines that are perceived as maintenance, that is to be taken daily "no matter what", from those medicines that are taken on only on an as-needed basis and I have given the patient examples of both, and then return to see our NP to generate a  detailed  medication calendar which should be followed until the next physician sees the patient and updates it.    ? Acid (or non-acid) GERD > always difficult to exclude as up to 75% of pts in some series report no assoc GI/ Heartburn symptoms> rec max (24h)  acid suppression and diet restrictions/ reviewed and instructions given in writing.   ? Allergy > depomedrol 120 mg IM x one today   ? BB effects > try on more selective BB (see hbp)

## 2014-02-02 ENCOUNTER — Ambulatory Visit (INDEPENDENT_AMBULATORY_CARE_PROVIDER_SITE_OTHER): Payer: BC Managed Care – PPO | Admitting: Adult Health

## 2014-02-02 ENCOUNTER — Encounter: Payer: Self-pay | Admitting: Adult Health

## 2014-02-02 VITALS — BP 128/74 | HR 92 | Temp 97.6°F | Ht 63.0 in | Wt 287.4 lb

## 2014-02-02 DIAGNOSIS — R0989 Other specified symptoms and signs involving the circulatory and respiratory systems: Secondary | ICD-10-CM

## 2014-02-02 DIAGNOSIS — R0609 Other forms of dyspnea: Secondary | ICD-10-CM

## 2014-02-02 DIAGNOSIS — R0600 Dyspnea, unspecified: Secondary | ICD-10-CM

## 2014-02-02 MED ORDER — NEBIVOLOL HCL 5 MG PO TABS: 5.0000 mg | ORAL_TABLET | Freq: Two times a day (BID) | ORAL | Status: DC

## 2014-02-02 MED ORDER — TELMISARTAN-HCTZ 80-12.5 MG PO TABS: 1.0000 | ORAL_TABLET | Freq: Every day | ORAL | Status: DC

## 2014-02-02 NOTE — Assessment & Plan Note (Addendum)
Dyspnea suspect, is multifactorial, with obesity, medication side effects, and mild reactive airways referred by reflux, and postnasal drip. Patient symptoms are improved with her recent medication changes and trigger . Prevention Patient's medications were reviewed today and patient education was given. Computerized medication calendar was adjusted/completed   Plan  Continue on current regimen. Follow med calendar closely and bring to each visit.  Patient is to followup with Dr. Melvyn Novas in 6 weeks and as needed.

## 2014-02-02 NOTE — Progress Notes (Signed)
Subjective:    Patient ID: [name], female    DOB: 1960-07-28  MRN: [id number]  HPI  24 yobf quit smoking march 2015 with h/o allergies as child required shots and some inhalers 100% better maybe around HS then around mid 20's recurrent problems coughed attributed to sinus / bronchitis s/p sinus surgery 2004 by Lucia Gaskins some better then short of breath starting May 2015 self-referred to pulmonary clinic 01/19/2014    01/19/2014 1st [facility] office visit/ Wert  Chief Complaint  Patient presents with  . Pulmonary Consult    Self referral.  Pt c/o SOB with or without exertion for approx 6 wks. She states that she is mainly SOB with exertion such as walking short distances such as from lobby to exam room this am.  She also c/o cough since "always"- prod with brown to clear, sometimes blood tinged sputum.    indolent onset sob/cough despite stopping smoking - only thing makes it better = stop moving >>Stop atenolol and norvasc (amlodipine) and your leg swelling should gradually improve   Start micardis 80/12.5 one daily  Start bystolic 5 mg 2 every 12 hours  Depomedrol 120 mg  Today should take care of any allergy contributing to your symptoms   02/02/2014 Follow up and Med Review  Patient returns for a two-week followup and medication review. Reviewed all her medications and organized them into a medication calendar with patient education. Appears the patient is taking. Her medication currently.(except taking bystolic 5mg  Twice daily  )  Patient was seen 2 weeks ago for initial coronary consult for shortness, of breath. Patient was changed offer for medications, Norvasc and atenolol. Patient says that her lower extremity swelling. Has resolved totally. Patient says her shortness breath is also improved and has been able to exercise. Patient denies any cough, months is, orthopnea, PND, or increased leg swelling Chest x-ray last visit was unremarkable. BNP was normal.   Current  Medications, Allergies, Complete Past Medical History, Past Surgical History, Family History, and Social History were reviewed in [employer] record.              Review of Systems  Constitutional:   No  weight loss, night sweats,  Fevers, chills, +fatigue, or  lassitude.  HEENT:   No headaches,  Difficulty swallowing,  Tooth/dental problems, or  Sore throat,                No sneezing, itching, ear ache,  +nasal congestion, post nasal drip,   CV:  No chest pain,  Orthopnea, PND, swelling in lower extremities, anasarca, dizziness, palpitations, syncope.   GI  No heartburn, indigestion, abdominal pain, nausea, vomiting, diarrhea, change in bowel habits, loss of appetite, bloody stools.   Resp: .  No chest wall deformity  Skin: no rash or lesions.  GU: no dysuria, change in color of urine, no urgency or frequency.  No flank pain, no hematuria   MS:  No joint pain or swelling.  No decreased range of motion.  No back pain.  Psych:  No change in mood or affect. No depression or anxiety.  No memory loss.          Objective:   Physical Exam  HEENT: nl dentition, turbinates, and orophanx. Nl external ear canals without cough reflex   NECK :  without JVD/Nodes/TM/ nl carotid upstrokes bilaterally   LUNGS: no acc muscle use, clear to A and P bilaterally without cough on insp or exp maneuvers  CV:  RRR  no s3 or murmur or increase in P2,  No edema   ABD:  soft and nontender with nl excursion in the supine position. No bruits or organomegaly, bowel sounds nl  MS:  warm without deformities, calf tenderness, cyanosis or clubbing  SKIN: warm and dry without lesions    NEURO:  alert, approp, no deficits        CXR  01/19/2014 :  No acute cardiopulmonary abnormality seen.    Recent Labs Lab 01/19/14 1018  NA 139  K 3.5  CL 101  CO2 31  BUN 12  CREATININE 0.8  GLUCOSE 111*    Recent Labs Lab 01/19/14 1018  HGB 13.0  HCT 38.6  WBC  8.8  PLT 267.0     Lab Results  Component Value Date   TSH 1.61 01/19/2014    Lab Results  Component Value Date   PROBNP 64.0 01/19/2014         Assessment & Plan:

## 2014-02-02 NOTE — Patient Instructions (Signed)
Continue on current regimen. Follow med calendar closely and bring to each visit.  Patient is to followup with Dr. Melvyn Novas in 6 weeks and as needed

## 2014-02-03 ENCOUNTER — Telehealth: Payer: Self-pay | Admitting: Internal Medicine

## 2014-02-03 MED ORDER — NEBIVOLOL HCL 5 MG PO TABS: 5.0000 mg | ORAL_TABLET | Freq: Two times a day (BID) | ORAL | Status: DC

## 2014-02-03 NOTE — Addendum Note (Signed)
Addended by: Parke Poisson E on: 02/03/2014 12:38 PM   Modules accepted: Orders

## 2014-02-03 NOTE — Telephone Encounter (Signed)
Called and spoke to pt. Pt stated [facility] never received the Bystolic order that was sent 02/02/14. Called [facility] and spoke to [place] and she confirmed the order never came in. Spoke to [employer], Software engineer, and gave verbal order. Called and made pt aware. Nothing further needed.

## 2014-02-21 ENCOUNTER — Telehealth: Payer: Self-pay | Admitting: Internal Medicine

## 2014-02-21 NOTE — Telephone Encounter (Signed)
Fine to try off but needs to be sure bp ok off it > have her see tammy NP if not other way to check it p off x a week

## 2014-02-21 NOTE — Telephone Encounter (Signed)
Called spoke with pt. She believes her medication bystolic 5 mg and micardis 80-12.5 mg is causing her to have HA. Reports she ran out of bystolic and did not take it for couple days and HA went away. When she got is refilled the HA came back. Requesting further recs. Please advise thanks  Allergies  Allergen Reactions  . Penicillins Hives

## 2014-02-21 NOTE — Telephone Encounter (Signed)
Aware of recs. Pt scheduled appt 03/06/14. Nothing further needed

## 2014-03-06 ENCOUNTER — Ambulatory Visit: Payer: BC Managed Care – PPO | Admitting: Adult Health

## 2014-03-07 ENCOUNTER — Encounter: Payer: Self-pay | Admitting: Adult Health

## 2014-03-07 ENCOUNTER — Encounter (INDEPENDENT_AMBULATORY_CARE_PROVIDER_SITE_OTHER): Payer: Self-pay

## 2014-03-07 ENCOUNTER — Ambulatory Visit (INDEPENDENT_AMBULATORY_CARE_PROVIDER_SITE_OTHER): Payer: BC Managed Care – PPO | Admitting: Adult Health

## 2014-03-07 VITALS — BP 128/90 | HR 80 | Temp 97.8°F | Ht 63.0 in | Wt 290.2 lb

## 2014-03-07 DIAGNOSIS — R0609 Other forms of dyspnea: Secondary | ICD-10-CM

## 2014-03-07 DIAGNOSIS — I10 Essential (primary) hypertension: Secondary | ICD-10-CM

## 2014-03-07 DIAGNOSIS — R0600 Dyspnea, unspecified: Secondary | ICD-10-CM

## 2014-03-07 DIAGNOSIS — R0989 Other specified symptoms and signs involving the circulatory and respiratory systems: Secondary | ICD-10-CM

## 2014-03-07 MED ORDER — TELMISARTAN-HCTZ 80-25 MG PO TABS: 1.0000 | ORAL_TABLET | Freq: Every day | ORAL | Status: DC

## 2014-03-07 NOTE — Progress Notes (Signed)
Subjective:    Patient ID: [name], female    DOB: 12-04-1960  MRN: [id number]  HPI  60 yobf quit smoking march 2015 with h/o allergies as child required shots and some inhalers 100% better maybe around HS then around mid 20's recurrent problems coughed attributed to sinus / bronchitis s/p sinus surgery 2004 by [name] some better then short of breath starting May 2015 self-referred to pulmonary clinic 01/19/2014    01/19/2014 1st [facility] office visit/ W[facility]  Chief Complaint  Patient presents with  . Pulmonary Consult    Self referral.  Pt c/o SOB with or without exertion for approx 6 wks. She states that she is mainly SOB with exertion such as walking short distances such as from lobby to exam room this am.  She also c/o cough since "always"- prod with brown to clear, sometimes blood tinged sputum.    indolent onset sob/cough despite stopping smoking - only thing makes it better = stop moving >>Stop atenolol and norvasc (amlodipine) and your leg swelling should gradually improve   Start micardis 80/12.5 one daily  Start bystolic 5 mg 2 every 12 hours  Depomedrol 120 mg  Today should take care of any allergy contributing to your symptoms   02/02/2014 Follow up and Med Review  Patient returns for a two-week followup and medication review. Reviewed all her medications and organized them into a medication calendar with patient education. Appears the patient is taking. Her medication currently.(except taking bystolic 5mg  Twice daily  )  Patient was seen 2 weeks ago for initial  Pulmonary consult for shortness, of breath. Patient was changed offer for medications, Norvasc and atenolol. Patient says that her lower extremity swelling. Has resolved totally. Patient says her shortness breath is also improved and has been able to exercise. Patient denies any cough, months is, orthopnea, PND, or increased leg swelling Chest x-ray last visit was unremarkable. BNP was normal. >>no change     03/07/2014 Follow up  Returns for follow up for b/p  Patient was seen 7/2 for initial  consult for shortness, of breath. Patient was changed offer for medications, Norvasc and atenolol.  Patient says her shortness breath is  improved and has been able to exercise more .  Complains that she developed HA and felt this was related to Bystolic. She stopped Bystolic and HA resolved.  Does complain that ankle swells some on/off.  No fever, chest pain,orthopnea.  B/p is doing good on current regimen .     Current Medications, Allergies, Complete Past Medical History, Past Surgical History, Family History, and Social History were reviewed in [employer] record.   Review of Systems  Constitutional:   No  weight loss, night sweats,  Fevers, chills, +fatigue, or  lassitude.  HEENT:   No headaches,  Difficulty swallowing,  Tooth/dental problems, or  Sore throat,                No sneezing, itching, ear ache,  +nasal congestion, post nasal drip,   CV:  No chest pain,  Orthopnea, PND, swelling in lower extremities, anasarca, dizziness, palpitations, syncope.   GI  No heartburn, indigestion, abdominal pain, nausea, vomiting, diarrhea, change in bowel habits, loss of appetite, bloody stools.   Resp: .  No chest wall deformity  Skin: no rash or lesions.  GU: no dysuria, change in color of urine, no urgency or frequency.  No flank pain, no hematuria   MS:  No joint pain or swelling.  No decreased range of motion.  No back pain.  Psych:  No change in mood or affect. No depression or anxiety.  No memory loss.          Objective:   Physical Exam  HEENT: nl dentition, turbinates, and orophanx. Nl external ear canals without cough reflex   NECK :  without JVD/Nodes/TM/ nl carotid upstrokes bilaterally   LUNGS: no acc muscle use, clear to A and P bilaterally without cough on insp or exp maneuvers   CV:  RRR  no s3 or murmur or increase in P2,  No edema   ABD:   soft and nontender with nl excursion in the supine position. No bruits or organomegaly, bowel sounds nl  MS:  warm without deformities, calf tenderness, cyanosis or clubbing  SKIN: warm and dry without lesions    NEURO:  alert, approp, no deficits        CXR  01/19/2014 :  No acute cardiopulmonary abnormality seen.

## 2014-03-07 NOTE — Assessment & Plan Note (Signed)
Dyspnea improved w/ medicaiton changes with discontinuation of norvasc and atenolol  Spirometry with preserved lung fxn .  cxr w/ no acute process  Plan  Cont on current regimen  follow up Dr. Melvyn Novas  In 6 weeks

## 2014-03-07 NOTE — Progress Notes (Signed)
Chart and ov progress notes reviewed/ agree with a/p

## 2014-03-07 NOTE — Assessment & Plan Note (Signed)
B/p is okay on monotherapy w/ micardis will increase HCT to see if this helps Remain off atenolol and bystolic for now  follow up Dr. Melvyn Novas  In 6 weeks  If doing well will need to follow up with PCP for HTN after this

## 2014-03-07 NOTE — Patient Instructions (Signed)
Increase Micardis HCT 80/25mg  1 daily  Increase Activity as tolerated.  Follow up Dr. Melvyn Novas  In 6 weeks and As needed

## 2014-04-05 ENCOUNTER — Emergency Department (HOSPITAL_COMMUNITY): Payer: BC Managed Care – PPO

## 2014-04-05 ENCOUNTER — Emergency Department (HOSPITAL_COMMUNITY)
Admission: EM | Admit: 2014-04-05 | Discharge: 2014-04-05 | Disposition: A | Discharge status: Home / Self Care | Payer: BC Managed Care – PPO | Attending: Emergency Medicine | Admitting: Emergency Medicine

## 2014-04-05 ENCOUNTER — Encounter (HOSPITAL_COMMUNITY): Payer: Self-pay | Admitting: Emergency Medicine

## 2014-04-05 DIAGNOSIS — M5430 Sciatica, unspecified side: Secondary | ICD-10-CM | POA: Insufficient documentation

## 2014-04-05 DIAGNOSIS — Z880 Allergy status to penicillin: Secondary | ICD-10-CM | POA: Diagnosis not present

## 2014-04-05 DIAGNOSIS — Z8709 Personal history of other diseases of the respiratory system: Secondary | ICD-10-CM | POA: Diagnosis not present

## 2014-04-05 DIAGNOSIS — Z87891 Personal history of nicotine dependence: Secondary | ICD-10-CM | POA: Insufficient documentation

## 2014-04-05 DIAGNOSIS — I10 Essential (primary) hypertension: Secondary | ICD-10-CM | POA: Insufficient documentation

## 2014-04-05 DIAGNOSIS — M545 Low back pain, unspecified: Secondary | ICD-10-CM | POA: Diagnosis present

## 2014-04-05 DIAGNOSIS — Z79899 Other long term (current) drug therapy: Secondary | ICD-10-CM | POA: Diagnosis not present

## 2014-04-05 LAB — URINALYSIS, ROUTINE W REFLEX MICROSCOPIC
Bilirubin Urine: NEGATIVE
Glucose, UA: NEGATIVE mg/dL
Ketones, ur: NEGATIVE mg/dL
Nitrite: NEGATIVE
Protein, ur: NEGATIVE mg/dL
Specific Gravity, Urine: 1.017 (ref 1.005–1.030)
Urobilinogen, UA: 0.2 mg/dL (ref 0.0–1.0)
pH: 7 (ref 5.0–8.0)

## 2014-04-05 LAB — URINE MICROSCOPIC-ADD ON

## 2014-04-05 MED ORDER — NAPROXEN 500 MG PO TABS
500.0000 mg | ORAL_TABLET | Freq: Two times a day (BID) | ORAL | Status: DC
Start: 2014-04-05 — End: 2014-05-04

## 2014-04-05 NOTE — ED Provider Notes (Signed)
CSN: [id number]     Arrival date & time 04/05/14  3220 History  This chart was scribed for non-physician practitioner working with Ovid Curd R. Alvino Chapel, MD, by Jeanell Sparrow, ED Scribe. This patient was seen in room [place] and the patient's care was started at 7:52 PM.   Chief Complaint  Patient presents with  . Back Pain   The history is provided by the patient. No language interpreter was used.   HPI Comments: [name] is a 53 y.o. female who presents to the Emergency Department complaining of constant moderate lower back pain that started 3 days ago. She reports that the pain started when she was at [facility] and had to use the bathroom. She states that the pain radiates to her sides and abdomen. She reports that the pain is exacerbated from sitting to standing position and whenever she has the urgency to use the bathroom. She reports no hx of back injury or back problems except for "an almost pinched nerve". She denies any weakness, difficulty ambulating, or urinary symptoms. Denies weakness, loss of bowel/bladder function or saddle anesthesia. Denies neck stiffness, headache, rash.  Denies fever or recent procedures to back.   Past Medical History  Diagnosis Date  . Hypertension   . Bronchitis    Past Surgical History  Procedure Laterality Date  . Abdominal hysterectomy    . Nasal sinus surgery    . Cyst removal hand     No family history on file. History  Substance Use Topics  . Smoking status: Former Smoker -- 1.00 packs/day for 35 years    Types: Cigarettes    Quit date: 09/18/2013  . Smokeless tobacco: Never Used  . Alcohol Use: No   OB History   Grav Para Term Preterm Abortions TAB SAB Ect Mult Living                 Review of Systems  Genitourinary: Negative for dysuria, hematuria and difficulty urinating.  Musculoskeletal: Positive for back pain.  Neurological: Negative for weakness.    Allergies  Penicillins  Home Medications   Prior to Admission  medications   Medication Sig Start Date End Date Taking? Authorizing Provider  acetaminophen (TYLENOL) 325 MG tablet Per bottle as needed for pain/headache    Historical Provider, MD  cetirizine (ZYRTEC) 10 MG tablet Take 10 mg by mouth at bedtime as needed for allergies.    Historical Provider, MD  dextromethorphan (DELSYM) 30 MG/5ML liquid 2 tsp by mouth every 12 hours as needed for cough    Historical Provider, MD  diphenhydrAMINE (BENADRYL) 25 mg capsule Take 25 mg by mouth at bedtime as needed.    Historical Provider, MD  famotidine (PEPCID) 20 MG tablet One at bedtime 01/19/14   Tanda Rockers, MD  fluticasone [facility]) 50 MCG/ACT nasal spray Place 2 sprays into both nostrils daily as needed.  12/13/13   Historical Provider, MD  naproxen sodium (ALEVE) 220 MG tablet Per bottle as needed for joint pain    Historical Provider, MD  pantoprazole (PROTONIX) 40 MG tablet Take 1 tablet (40 mg total) by mouth daily. Take 30-60 min before first meal of the day 01/19/14   Tanda Rockers, MD  telmisartan-hydrochlorothiazide (MICARDIS HCT) 80-25 MG per tablet Take 1 tablet by mouth daily. 03/07/14   Tammy S Parrett, NP   BP 167/97  Pulse 85  Temp(Src) 98.1 F (36.7 C) (Oral)  Resp 18  SpO2 100% Physical Exam  Nursing note and vitals reviewed.  Constitutional: She is oriented to person, place, and time. She appears well-developed and well-nourished. No distress.  HENT:  Head: Normocephalic and atraumatic.  Neck: Neck supple. No tracheal deviation present.  Cardiovascular: Normal rate.   Pulmonary/Chest: Effort normal. No respiratory distress.  Musculoskeletal: Normal range of motion. She exhibits tenderness.  SI joint are TTP. TTT in sacrum and bilateral gluteus. ROM limited due to pain and tenderness. DTRs normal. NVI. Full strength, able to ambulate  Neurological: She is alert and oriented to person, place, and time.  Skin: Skin is warm and dry.  Psychiatric: She has a normal mood and affect.  Her behavior is normal.    ED Course  Procedures (including critical care time) DIAGNOSTIC STUDIES: Oxygen Saturation is 100% on RA, normal by my interpretation.    COORDINATION OF CARE: 7:56 PM- Pt advised of plan for treatment which includes radiology and pt agrees.  Labs Review Labs Reviewed - No data to display  Imaging Review No results found.   EKG Interpretation None      MDM   Final diagnoses:  Low back pain, unspecified back pain laterality, with sciatica presence unspecified   Patient with back pain.  No neurological deficits and normal neuro exam.  Patient can walk but states is painful.  No loss of bowel or bladder control.  No concern for cauda equina.  No fever, night sweats, weight loss, h/o cancer, IVDU.  RICE protocol and pain medicine indicated and discussed with patient.  ] I personally performed the services described in this documentation, which was scribed in my presence. The recorded information has been reviewed and is accurate.    Margarita Mail, PA-C 04/13/14 1028

## 2014-04-05 NOTE — Discharge Instructions (Signed)
Recommend Naproxen for pain control. Also recommend you ice your back 2-3 times per say for at least 20 minutes each time. Follow up with a primary doctor to ensure resolution of symptoms.  Back Pain, Adult Low back pain is very common. About 1 in 5 people have back pain.The cause of low back pain is rarely dangerous. The pain often gets better over time.About half of people with a sudden onset of back pain feel better in just 2 weeks. About 8 in 10 people feel better by 6 weeks.  CAUSES Some common causes of back pain include:  Strain of the muscles or ligaments supporting the spine.  Wear and tear (degeneration) of the spinal discs.  Arthritis.  Direct injury to the back. DIAGNOSIS Most of the time, the direct cause of low back pain is not known.However, back pain can be treated effectively even when the exact cause of the pain is unknown.Answering your caregiver's questions about your overall health and symptoms is one of the most accurate ways to make sure the cause of your pain is not dangerous. If your caregiver needs more information, he or she may order lab work or imaging tests (X-rays or MRIs).However, even if imaging tests show changes in your back, this usually does not require surgery. HOME CARE INSTRUCTIONS For many people, back pain returns.Since low back pain is rarely dangerous, it is often a condition that people can learn to [facility] their own.   Remain active. It is stressful on the back to sit or stand in one place. Do not sit, drive, or stand in one place for more than 30 minutes at a time. Take short walks on level surfaces as soon as pain allows.Try to increase the length of time you walk each day.  Do not stay in bed.Resting more than 1 or 2 days can delay your recovery.  Do not avoid exercise or work.Your body is made to move.It is not dangerous to be active, even though your back may hurt.Your back will likely heal faster if you return to being active  before your pain is gone.  Pay attention to your body when you bend and lift. Many people have less discomfortwhen lifting if they bend their knees, keep the load close to their bodies,and avoid twisting. Often, the most comfortable positions are those that put less stress on your recovering back.  Find a comfortable position to sleep. Use a firm mattress and lie on your side with your knees slightly bent. If you lie on your back, put a pillow under your knees.  Only take over-the-counter or prescription medicines as directed by your caregiver. Over-the-counter medicines to reduce pain and inflammation are often the most helpful.Your caregiver may prescribe muscle relaxant drugs.These medicines help dull your pain so you can more quickly return to your normal activities and healthy exercise.  Put ice on the injured area.  Put ice in a plastic bag.  Place a towel between your skin and the bag.  Leave the ice on for 15-20 minutes, 03-04 times a day for the first 2 to 3 days. After that, ice and heat may be alternated to reduce pain and spasms.  Ask your caregiver about trying back exercises and gentle massage. This may be of some benefit.  Avoid feeling anxious or stressed.Stress increases muscle tension and can worsen back pain.It is important to recognize when you are anxious or stressed and learn ways to manage it.Exercise is a great option. SEEK MEDICAL CARE IF:  You  have pain that is not relieved with rest or medicine.  You have pain that does not improve in 1 week.  You have new symptoms.  You are generally not feeling well. SEEK IMMEDIATE MEDICAL CARE IF:   You have pain that radiates from your back into your legs.  You develop new bowel or bladder control problems.  You have unusual weakness or numbness in your arms or legs.  You develop nausea or vomiting.  You develop abdominal pain.  You feel faint. Document Released: 07/07/2005 Document Revised: 01/06/2012  Document Reviewed: 11/08/2013 [facility] Patient Information 2015 [place], [place]. This information is not intended to replace advice given to you by your health care provider. Make sure you discuss any questions you have with your health care provider.

## 2014-04-05 NOTE — ED Provider Notes (Signed)
2110 - Patient care assumed from [facility], PA-C at shift change. Patient presents for low back pain. Patient has urinalysis and imaging pending. Plan discussed with Kenton Kingfisher, PA-C which includes discharge with naproxen and if workup is negative.  X-ray today shows no significant findings. No malalignment or bony deformity. Urinalysis with few bacteria. UA does show too numerous to count white blood cells. This may be secondary to contamination as patient denies urinary symptoms and nitrites are negative. Will send urine for culture. Patient stable and appropriate discharged with prescription for naproxen. Return precautions provided. Patient discharged in good condition; VSS.   Filed Vitals:   04/05/14 1908  BP: 167/97  Pulse: 85  Temp: 98.1 F (36.7 C)  TempSrc: Oral  Resp: 18  SpO2: 100%   Results for orders placed during the hospital encounter of 04/05/14  URINALYSIS, ROUTINE W REFLEX MICROSCOPIC      Result Value Ref Range   Color, Urine YELLOW  YELLOW   APPearance CLOUDY (*) CLEAR   Specific Gravity, Urine 1.017  1.005 - 1.030   pH 7.0  5.0 - 8.0   Glucose, UA NEGATIVE  NEGATIVE mg/dL   Hgb urine dipstick SMALL (*) NEGATIVE   Bilirubin Urine NEGATIVE  NEGATIVE   Ketones, ur NEGATIVE  NEGATIVE mg/dL   Protein, ur NEGATIVE  NEGATIVE mg/dL   Urobilinogen, UA 0.2  0.0 - 1.0 mg/dL   Nitrite NEGATIVE  NEGATIVE   Leukocytes, UA LARGE (*) NEGATIVE  URINE MICROSCOPIC-ADD ON      Result Value Ref Range   Squamous Epithelial / LPF MANY (*) RARE   WBC, UA TOO NUMEROUS TO COUNT  <3 WBC/hpf   RBC / HPF 0-2  <3 RBC/hpf   Bacteria, UA FEW (*) RARE   Dg Sacrum/coccyx  04/05/2014   CLINICAL DATA:  Sacrum pain for 3 days.  No known injury.  EXAM: SACRUM AND COCCYX - 2+ VIEW  COMPARISON:  None.  FINDINGS: There is no evidence of fracture or other focal bone lesions. Sacroiliac joints appear normal.  IMPRESSION: Normal exam.   Electronically Signed   By: Rozetta Nunnery M.D.   On: 04/05/2014  20:26      Antonietta Breach, PA-C 04/05/14 2120

## 2014-04-05 NOTE — ED Notes (Signed)
Pt states that she began having low back pain on Sunday.  Denies injury.  Hurts with movement.

## 2014-04-05 NOTE — Progress Notes (Signed)
  CARE MANAGEMENT ED NOTE 04/05/2014  Patient:  SCHARLENE, CATALINA   Account Number:  [id number]  Date Initiated:  04/05/2014  Documentation initiated by:  Livia Snellen  Subjective/Objective Assessment:   Patient presents to Ed with lower back pain since Sunday.     Subjective/Objective Assessment Detail:     Action/Plan:   Action/Plan Detail:   Anticipated DC Date:  04/05/2014     Status Recommendation to Physician:   Result of Recommendation:    Other ED [place]  Other  PCP issues    Choice offered to / List presented to:            Status of service:  Completed, signed off  ED Comments:   ED Comments Detail:  EDCm spoke to patient at bedside.  Patient confirmsshe does not have a pcp and has [employer].  [facility] provided patient with porinted list of pcps who accept BCBS within a ten mile radius of patient's zip code [phone].  Patient thankful for resources.  No further EDCM needs at this time.

## 2014-04-06 LAB — URINE CULTURE: Colony Count: 30000

## 2014-04-06 NOTE — ED Provider Notes (Signed)
Medical screening examination/treatment/procedure(s) were performed by non-physician practitioner and as supervising physician I was immediately available for consultation/collaboration.   EKG Interpretation None       Jasper Riling. Alvino Chapel, MD 04/06/14 7322

## 2014-04-14 NOTE — ED Provider Notes (Signed)
Medical screening examination/treatment/procedure(s) were performed by non-physician practitioner and as supervising physician I was immediately available for consultation/collaboration.   EKG Interpretation None       Jasper Riling. Alvino Chapel, MD 04/14/14 [phone]

## 2014-04-18 ENCOUNTER — Encounter: Payer: Self-pay | Admitting: Internal Medicine

## 2014-04-18 ENCOUNTER — Ambulatory Visit (INDEPENDENT_AMBULATORY_CARE_PROVIDER_SITE_OTHER): Payer: BC Managed Care – PPO | Admitting: Internal Medicine

## 2014-04-18 VITALS — BP 148/92 | HR 70 | Temp 97.8°F | Ht 63.0 in | Wt 297.8 lb

## 2014-04-18 DIAGNOSIS — R0600 Dyspnea, unspecified: Secondary | ICD-10-CM

## 2014-04-18 DIAGNOSIS — R0609 Other forms of dyspnea: Secondary | ICD-10-CM

## 2014-04-18 DIAGNOSIS — R0989 Other specified symptoms and signs involving the circulatory and respiratory systems: Secondary | ICD-10-CM

## 2014-04-18 DIAGNOSIS — I10 Essential (primary) hypertension: Secondary | ICD-10-CM

## 2014-04-18 NOTE — Assessment & Plan Note (Signed)
Trial off amlodipine 01/19/2014 due to leg swelling and off atenolol due to ? Asthma> both resolved   Still on high side but plans diet/ wt loss/ f/u with primary care so will leave well enough alone

## 2014-04-18 NOTE — Patient Instructions (Signed)
Congratulations on not smoking, this is the most important aspect of your care!   If you are satisfied with your treatment plan,  let your doctor know and he/she can either refill your medications or you can return here when your prescription runs out.     If in any way you are not 100% satisfied,  please tell us.  If 100% better, tell your friends!  Pulmonary follow up is as needed

## 2014-04-18 NOTE — Progress Notes (Signed)
Subjective:    Patient ID: [name], female    DOB: Dec 19, 1960  MRN: [id number]    Brief patient profile:  51  yobf quit smoking march 2015 with h/o allergies as child required shots and some inhalers 100% better maybe around HS then around mid 20's recurrent problems cough  attributed to sinus / bronchitis s/p sinus surgery 2004 by Lucia Gaskins some better then short of breath starting May 2015 self-referred to pulmonary clinic 01/19/2014    01/19/2014 1st [facility] office visit/ Malacki Mcphearson  Chief Complaint  Patient presents with  . Pulmonary Consult    Self referral.  Pt c/o SOB with or without exertion for approx 6 wks. She states that she is mainly SOB with exertion such as walking short distances such as from lobby to exam room this am.  She also c/o cough since "always"- prod with brown to clear, sometimes blood tinged sputum.    indolent onset sob/cough despite stopping smoking - only thing makes it better = stop moving >>Stop atenolol and norvasc (amlodipine) and your leg swelling should gradually improve  Start micardis 80/12.5 one daily  Start bystolic 5 mg 2 every 12 hours  Depomedrol 120 mg  Today should take care of any allergy contributing to your symptoms   02/02/2014 Follow up and Med Review/ NP Patient returns for a two-week followup and medication review. Reviewed all her medications and organized them into a medication calendar with patient education. Appears the patient is taking. Her medication currently.(except taking bystolic 5mg  Twice daily  )  Patient was seen 2 weeks ago for initial  Pulmonary consult for shortness, of breath. Patient was changed offer for medications, Norvasc and atenolol. Patient says that her lower extremity swelling. Has resolved totally. Patient says her shortness breath is also improved and has been able to exercise.  >>no change   03/07/2014 Follow up  Returns for follow up for b/p  Patient was seen 7/2 for initial  consult for shortness,  of breath. Patient was changed off  Norvasc and atenolol.  Patient says her shortness breath is  improved and has been able to exercise more .  Complains that she developed HA and felt this was related to Bystolic. She stopped Bystolic and HA resolved.  Does complain that ankle swells some on/off.  rec increase Micardis HCT 80/25mg  1 daily      04/18/2014 f/u ov/Ryanne Morand re: hbp/ pseudoasthma med related / no med calendar  Chief Complaint  Patient presents with  . Follow-up    Pt states that her breathing is still doing well.  No new co's today.   no longer on BB at all and doing fine.  Not limited by breathing from desired activities    No obvious day to day or daytime variabilty or assoc chronic cough or cp or chest tightness, subjective wheeze overt sinus or hb symptoms. No unusual exp hx or h/o childhood pna/ asthma or knowledge of premature birth.  Sleeping ok without nocturnal  or early am exacerbation  of respiratory  c/o's or need for noct saba. Also denies any obvious fluctuation of symptoms with weather or environmental changes or other aggravating or alleviating factors except as outlined above   Current Medications, Allergies, Complete Past Medical History, Past Surgical History, Family History, and Social History were reviewed in [employer] record.  ROS  The following are not active complaints unless bolded sore throat, dysphagia, dental problems, itching, sneezing,  nasal congestion or excess/ purulent secretions,  ear ache,   fever, chills, sweats, unintended wt loss, pleuritic or exertional cp, hemoptysis,  orthopnea pnd or leg swelling, presyncope, palpitations, heartburn, abdominal pain, anorexia, nausea, vomiting, diarrhea  or change in bowel or urinary habits, change in stools or urine, dysuria,hematuria,  rash, arthralgias, visual complaints, headache, numbness weakness or ataxia or problems with walking or coordination,  change in mood/affect or memory.            Objective:   Physical Exam   . Wt Readings from Last 3 Encounters:  04/18/14 297 lb 12.8 oz (135.081 kg)  03/07/14 290 lb 3.2 oz (131.634 kg)  02/02/14 287 lb 6.4 oz (130.364 kg)      HEENT: nl dentition, turbinates, and orophanx. Nl external ear canals without cough reflex   NECK :  without JVD/Nodes/TM/ nl carotid upstrokes bilaterally   LUNGS: no acc muscle use, clear to A and P bilaterally without cough on insp or exp maneuvers   CV:  RRR  no s3 or murmur or increase in P2,  No edema   ABD:  soft and nontender with nl excursion in the supine position. No bruits or organomegaly, bowel sounds nl  MS:  warm without deformities, calf tenderness, cyanosis or clubbing  SKIN: warm and dry without lesions    NEURO:  alert, approp, no deficits        CXR  01/19/2014 :  No acute cardiopulmonary abnormality seen.

## 2014-04-18 NOTE — Assessment & Plan Note (Signed)
-   01/19/2014  Walked RA x 3 laps @ 185 ft each stopped due to  End of study, no desats, some coughing  -  01/19/2014 nl spirometry except for [id number] ? Significance? rec > - trial off atenolol  01/19/2014 > resolved   Emphasized most important aspect of her care is control wt and stay off cigarettes, pulmonary f/u is prn

## 2014-04-25 ENCOUNTER — Other Ambulatory Visit: Payer: Self-pay | Admitting: Internal Medicine

## 2014-05-04 ENCOUNTER — Ambulatory Visit (INDEPENDENT_AMBULATORY_CARE_PROVIDER_SITE_OTHER): Payer: BC Managed Care – PPO | Admitting: Internal Medicine

## 2014-05-04 ENCOUNTER — Encounter: Payer: Self-pay | Admitting: Internal Medicine

## 2014-05-04 VITALS — BP 158/84 | HR 96 | Temp 97.9°F | Resp 16 | Ht 63.0 in | Wt 293.4 lb

## 2014-05-04 DIAGNOSIS — Z0000 Encounter for general adult medical examination without abnormal findings: Secondary | ICD-10-CM

## 2014-05-04 DIAGNOSIS — I10 Essential (primary) hypertension: Secondary | ICD-10-CM

## 2014-05-04 DIAGNOSIS — M79644 Pain in right finger(s): Secondary | ICD-10-CM

## 2014-05-04 DIAGNOSIS — R0600 Dyspnea, unspecified: Secondary | ICD-10-CM

## 2014-05-04 MED ORDER — PHENDIMETRAZINE TARTRATE ER 105 MG PO CP24: 105.0000 mg | ORAL_CAPSULE | Freq: Every day | ORAL | Status: DC

## 2014-05-04 NOTE — Assessment & Plan Note (Signed)
Will trial phendimetrazine daily for 12 week maximum. She will follow up in 1 month for check in. Counseled her about diet and exercise as an important part of her weight loss. Warned her that if she does not change her lifestyle that likely any weight loss would be regained once she stops the medicine. Spoke with her about risks and benefits of treatment with weight loss medication and possible risks of her weight.

## 2014-05-04 NOTE — Progress Notes (Signed)
Pre visit review using our clinic review tool, if applicable. No additional management support is needed unless otherwise documented below in the visit note. 

## 2014-05-04 NOTE — Progress Notes (Signed)
   Subjective:    Patient ID: [name], female    DOB: [birth year], 53 y.o.   MRN: [id number]  HPI The patient is a 53 YO female who is coming in to establish care. She has PMH of HTN and dyspnea with exertion (thought to be related to weight). She is not exercising much at all lately as she is now unemployed and taking care of a grandchild some days of the week. She is also asking about a weight loss medication that she could use to jumpstart her weight loss. She has been lower weight before and thinks that this will help her breathing and her blood pressure. She did not wish to go to the bariatric clinic. Her goal weight is 190 pounds. She is having thumb pain for the last 6 months and is wearing a wrap at this time.    Review of Systems  Constitutional: Positive for activity change and unexpected weight change. Negative for fever, appetite change and fatigue.       Exercising less and gaining weight  HENT: Negative.   Eyes: Negative.   Respiratory: Positive for shortness of breath. Negative for cough, chest tightness and wheezing.   Cardiovascular: Negative for chest pain, palpitations and leg swelling.  Gastrointestinal: Negative for abdominal pain, diarrhea, constipation and abdominal distention.  Musculoskeletal: Negative for arthralgias, back pain, gait problem and myalgias.  Skin: Negative.   Neurological: Negative for dizziness, weakness, light-headedness and headaches.      Objective:   Physical Exam  Vitals reviewed. Constitutional: She is oriented to person, place, and time. She appears well-developed and well-nourished. No distress.  Morbidly obese  HENT:  Head: Normocephalic and atraumatic.  Eyes: EOM are normal.  Neck: Normal range of motion.  Cardiovascular: Normal rate and regular rhythm.   Pulmonary/Chest: Effort normal. No respiratory distress. She has no wheezes. She has no rales.  Breath sounds diminished due to habitus  Abdominal: Soft. Bowel sounds are  normal. She exhibits no distension. There is no tenderness. There is no rebound.  Neurological: She is alert and oriented to person, place, and time. Coordination normal.  Skin: Skin is warm and dry.   Filed Vitals:   05/04/14 0838 05/04/14 0858  BP: 162/92 158/84  Pulse: 96   Temp: 97.9 F (36.6 C)   TempSrc: Oral   Resp: 16   Height: 5\' 3"  (1.6 m)   Weight: 293 lb 6.4 oz (133.085 kg)   SpO2: 97%       Assessment & Plan:

## 2014-05-04 NOTE — Assessment & Plan Note (Signed)
Spoke with patient about weight loss and exercise. She will work on it. Also advised her to go back and get mammogram as it has been several years (her estimate >5).

## 2014-05-04 NOTE — Assessment & Plan Note (Signed)
Agree that her weight is likely impacting her perception of breathlessness. She was advised to lose weight and resume exercise.

## 2014-05-04 NOTE — Assessment & Plan Note (Signed)
BP slightly high today but hold off on additional agent as she will be starting her intensive weight loss period. Follow up in 1 month for labs.

## 2014-05-04 NOTE — Patient Instructions (Signed)
We are going to start you on a medicine that helps with weight loss that is approved to be used for 12 weeks. Take 1 pill before breakfast in the morning and it lasts all day. This is a controlled substance so be careful not to lose it.   We will see you back in about 1 month to check on your weight. In addition to the medicine you should be working on your diet and exercising more. If you do not change your diet then you will likely lose weight with this medicine but then gain it back when you stop taking it.   Serving Sizes What we call a serving size today is larger than it was in the past. A 1950s fast-food burger contained little more than 1 oz of meat, and a soft drink was 8 oz (1 cup). Today, a "quarter pounder" burger is at least 4 times that amount, and a 32 or 64 oz drink is not uncommon. A possible guide for eating when trying to lose weight is to eat about half as much as you normally do. Some estimates of serving sizes are:  1 Dairy serving:Individual container of yogurt (8 oz) or piece of cheese the size of your thumb (1 oz).  1 Grain serving: 1 slice of bread or  cup pasta.  1 Meat serving: The size of a deck of cards (3 oz).  1 Fruit serving: cup canned fruit or 1 medium fruit.  1 Vegetable serving:  cup of cooked or canned vegetables.  1 Fat serving:The size of 4 stacked dimes. Experts suggest spending 1 or 2 days measuring food portions you commonly eat. This will give you better practice at estimating serving sizes, and will also show whether you are eating an appropriate amount of food to meet your weight goals. If you find that you are eating more than you thought, try measuring your food for a few days so you can "reprogram" yourself to learn what makes a healthy portion for you. SUGGESTIONS FOR CONTROL  In restaurants, share entrees, or ask the waiter to put half the entre in a box or bag before you even touch it.  Order lunch-sized portions. Many restaurants serve  4 to 6 oz of meat at lunch, compared with 8 to 10 oz at dinner.  Split dessert or skip it all together. Have a piece of fruit when you get home.  At home, use smaller plates and bowls. It will look as if you are eating more.  Plate your food in the kitchen rather than serving it "family style" at the table.  Wait 20 to 30 minutes before taking seconds. This is how long it takes your brain to recognize that you are full.  Check food labels for serving sizes. Eat 1 serving only.  Use measuring cups and spoons to see proper serving sizes.  Buy smaller packages of candy, popcorn, and snacks.  Avoid eating directly out of the bag or carton.  While eating half as much, exercise twice as much. Park further away from the mall, take the stairs instead of the escalator, and walk around your block. Losing weight is a slow, difficult process. It takes long-lasting lifestyle changes. You can make gradual changes over time so they become habits. Look to friends and family to support the healthy changes you are making. Avoid fad diets since they are often only temporary weight loss solutions. Document Released: 04/05/2003 Document Revised: 09/29/2011 Document Reviewed: 10/04/2013 [facility] Patient Information 2015 [place], [place].  This information is not intended to replace advice given to you by your health care provider. Make sure you discuss any questions you have with your health care provider.   Exercise to Lose Weight Exercise and a healthy diet may help you lose weight. Your doctor may suggest specific exercises. EXERCISE IDEAS AND TIPS  Choose low-cost things you enjoy doing, such as walking, bicycling, or exercising to workout videos.  Take stairs instead of the elevator.  Walk during your lunch break.  Park your car further away from work or school.  Go to a gym or an exercise class.  Start with 5 to 10 minutes of exercise each day. Build up to 30 minutes of exercise 4 to 6 days a  week.  Wear shoes with good support and comfortable clothes.  Stretch before and after working out.  Work out until you breathe harder and your heart beats faster.  Drink extra water when you exercise.  Do not do so much that you hurt yourself, feel dizzy, or get very short of breath. Exercises that burn about 150 calories:  Running 1  miles in 15 minutes.  Playing volleyball for 45 to 60 minutes.  Washing and waxing a car for 45 to 60 minutes.  Playing touch football for 45 minutes.  Walking 1  miles in 35 minutes.  Pushing a stroller 1  miles in 30 minutes.  Playing basketball for 30 minutes.  Raking leaves for 30 minutes.  Bicycling 5 miles in 30 minutes.  Walking 2 miles in 30 minutes.  Dancing for 30 minutes.  Shoveling snow for 15 minutes.  Swimming laps for 20 minutes.  Walking up stairs for 15 minutes.  Bicycling 4 miles in 15 minutes.  Gardening for 30 to 45 minutes.  Jumping rope for 15 minutes.  Washing windows or floors for 45 to 60 minutes. Document Released: 08/09/2010 Document Revised: 09/29/2011 Document Reviewed: 08/09/2010 [facility] Patient Information 2015 [place], [place]. This information is not intended to replace advice given to you by your health care provider. Make sure you discuss any questions you have with your health care provider.   Phendimetrazine extended-release capsules What is this medicine? PHENDIMETRAZINE (fen dye MET ra zeen) decreases your appetite. It is used with a reduced calorie diet and exercise to help you lose weight. This medicine may be used for other purposes; ask your health care provider or pharmacist if you have questions. COMMON BRAND NAME(S): Bontril SR, [place] [address], Prelu-2 What should I tell my health care provider before I take this medicine? They need to know if you have any of these conditions: -agitation or nervousness -diabetes -glaucoma -heart disease -high blood pressure -history  of substance abuse -kidney disease -lung disease called Primary Pulmonary Hypertension (PPH) -taken an MAOI like Carbex, Eldepryl, Marplan, Nardil, or Parnate in last 14 days -thyroid disease -an unusual or allergic reaction to phendimetrazine, other medicines, foods, dyes, or preservatives -pregnant or trying to get pregnant -breast-feeding How should I use this medicine? Take this medicine by mouth with a glass of water. Follow the directions on the prescription label. Take this medicine 30 to 60 minutes before breakfast. Swallow whole. Do not open or chew the capsules. Do not take your medicine more often than directed. Do not suddenly stop taking your medicine because you may develop a severe reaction. Your doctor will tell you how much medicine to take. If your doctor wants you to stop the medicine, the dose will be slowly lowered over time to  avoid any side effects. Talk to your pediatrician regarding the use of this medicine in children. While this medicine may be prescribed for children as young as 85 years of age for selected conditions, precautions do apply. Overdosage: If you think you have taken too much of this medicine contact a poison control center or emergency room at once. NOTE: This medicine is only for you. Do not share this medicine with others. What if I miss a dose? If you miss a dose, take it as soon as you can. If it is almost time for your next dose, take only that dose. Do not take double or extra doses. What may interact with this medicine? Do not take this medicine with any of the following medications: -MAOIs like Carbex, Eldepryl, Marplan, Nardil, and Parnate -medicines for colds or breathing difficulties like pseudoephedrine or phenylephrine -procarbazine -sibutramine -stimulants like dexmethylphenidate, methylphenidate or modafinil This medicine may also interact with the following medications: -medicines for diabetes -medicines for high blood pressure This  list may not describe all possible interactions. Give your health care provider a list of all the medicines, herbs, non-prescription drugs, or dietary supplements you use. Also tell them if you smoke, drink alcohol, or use illegal drugs. Some items may interact with your medicine. What should I watch for while using this medicine? Notify your physician immediately if you become short of breath while doing your normal activities. Do not take this medicine within 6 hours of bedtime. It can keep you from getting to sleep. Avoid drinks that contain caffeine and try to stick to a regular bedtime every night. This medicine was intended to be used in addition to a healthy diet and exercise. The best results are achieved this way. This medicine is only indicated for short-term use. Eventually your weight loss may level out. At that point, the drug will only help you maintain your new weight. Do not increase or in any way change your dose without consulting your doctor. You may get drowsy or dizzy. Do not drive, use machinery, or do anything that needs mental alertness until you know how this medicine affects you. Do not stand or sit up quickly, especially if you are an older patient. This reduces the risk of dizzy or fainting spells. Alcohol may increase dizziness and drowsiness. Avoid alcoholic drinks. What side effects may I notice from receiving this medicine? Side effects that you should report to your doctor or health care professional as soon as possible: -chest pain, palpitations -depression or severe changes in mood -increased blood pressure -irritability -nervousness or restlessness -painful urination -severe dizziness -vomiting Side effects that usually do not require medical attention (report to your doctor or health care professional if they continue or are bothersome): -blurred vision or other eye problems -changes in sexual ability or desire -constipation or diarrhea -difficulty  sleeping -dry mouth -flushing -headache -nausea -sweating This list may not describe all possible side effects. Call your doctor for medical advice about side effects. You may report side effects to FDA at 1-800-FDA-1088. Where should I keep my medicine? Keep out of the reach of children. This medicine can be abused. Keep your medicine in a safe place to protect it from theft. Do not share this medicine with anyone. Selling or giving away this medicine is dangerous and against the law. Store between 20 and 25 degrees C (68 and 77 degrees F). Throw away any unused medicine after the expiration date. NOTE: This sheet is a summary. It may not cover all  possible information. If you have questions about this medicine, talk to your doctor, pharmacist, or health care provider.  2015, [employer]. (2010-09-17 17:04:33)

## 2014-05-18 ENCOUNTER — Encounter: Payer: Self-pay | Admitting: Family Medicine

## 2014-05-18 ENCOUNTER — Ambulatory Visit (INDEPENDENT_AMBULATORY_CARE_PROVIDER_SITE_OTHER): Payer: BC Managed Care – PPO | Admitting: Family Medicine

## 2014-05-18 ENCOUNTER — Other Ambulatory Visit (INDEPENDENT_AMBULATORY_CARE_PROVIDER_SITE_OTHER): Payer: BC Managed Care – PPO

## 2014-05-18 VITALS — BP 187/102 | HR 98 | Wt 290.0 lb

## 2014-05-18 DIAGNOSIS — M79644 Pain in right finger(s): Secondary | ICD-10-CM

## 2014-05-18 DIAGNOSIS — M65311 Trigger thumb, right thumb: Secondary | ICD-10-CM | POA: Insufficient documentation

## 2014-05-18 NOTE — Assessment & Plan Note (Signed)
Patient was given an ultrasound-guided injection into the tendon sheath today. Patient tolerated the procedure very well. Patient will do bracing 23 hours a day for the next week and then nightly for another 2 weeks. We discussed and icing protocol. No medications were given. Patient will follow-up in 2 weeks to make sure she continues to improve.

## 2014-05-18 NOTE — Progress Notes (Signed)
[facility] [address], [place] [address] Phone: [phone] Subjective:    I'm seeing this patient by the request  of:  Olga Millers, MD   CC:  Thumb pain. right  [id number] [name] is a 53 y.o. female coming in with complaint of right thumb pain. Patient states that she has been trying to wrap it with no significant benefit. Patient states she has had this pain for approximately 6 months. Patient remembers actually jamming her thumb when she was trying to get her grandchild out of his car seat. Patient states since then she's been having a dull throbbing aching pain that stop her from some different activities. Patient states that actually that can get stuck in a flexed motion and she needs to use her other hand to extend the thumb. Denies any numbness or tingling. States that the pain though if she rolls on it can wake her up at night. Patient puts the severity of 6 out of 10. Patient has been unable to open a can a regular basis. Patient has tried over-the-counter medicines with mild improvement.     Past medical history, social, surgical and family history all reviewed in electronic medical record.   Review of Systems: No headache, visual changes, nausea, vomiting, diarrhea, constipation, dizziness, abdominal pain, skin rash, fevers, chills, night sweats, weight loss, swollen lymph nodes, body aches, joint swelling, muscle aches, chest pain, shortness of breath, mood changes.   Objective Blood pressure 187/102, pulse 98, weight 290 lb (131.543 kg), SpO2 97.00%.  General: No apparent distress alert and oriented x3 mood and affect normal, dressed appropriately.  HEENT: Pupils equal, extraocular movements intact  Respiratory: Patient's speak in full sentences and does not appear short of breath  Cardiovascular: No lower extremity edema, non tender, no erythema  Skin: Warm dry intact with no signs of infection or rash on extremities  or on axial skeleton.  Abdomen: Soft nontender  Neuro: Cranial nerves II through XII are intact, neurovascularly intact in all extremities with 2+ DTRs and 2+ pulses.  Lymph: No lymphadenopathy of posterior or anterior cervical chain or axillae bilaterally.  Gait normal with good balance and coordination.  MSK:  Non tender with full range of motion and good stability and symmetric strength and tone of shoulders, elbows, wrist, hip, knee and ankles bilaterally.  Hand exam shows the patient does have mild swelling over the flexor tendon on the right thumb compared to the contralateral side. Patient does have triggering of the right thumb with flexion. Tendon sheath nodule appreciated on the flexor aspect of the thumb that is tender to palpation. Patient has a negative Finkelstein's test. Her vascular intact. UCL intact.  Limited [place] skeletal ultrasound was performed and interpreted by Hulan Saas, M  Limited ultrasound showed patient does have a hypoechoic change within the flexor tendon sheath and a negative dynamic testing is causing triggering of the thumb. No significant arthritis noted of the [facility] joint. Scaphoid bone has no significant changes. Impression: Trigger thumb  After verbal consent patient was prepped with alcohol swabs and with a 25-gauge 1 inch needle under ultrasound guidance patient did have an injection within the tendon sheath. Vision and 0.5 mL of 0.5% Marcaine and 0.5 mL a: 40 mg/dL injected. Patient tolerated the procedure well with complete resolution of pain but continued triggering.  Impression: Satisfactory ultrasound guidance injection of the flexor tendon sheath.   Impression and Recommendations:     This case required  medical decision making of moderate complexity.

## 2014-05-18 NOTE — Patient Instructions (Signed)
Good to see you Ice 20 minutes 2 times daily. Usually after activity and before bed. Wear brace day and night for next week then nightly for another 2 weeks.  See me again in 2 weeks and we will make sure you are all the way better.

## 2014-05-31 ENCOUNTER — Other Ambulatory Visit: Payer: Self-pay | Admitting: Adult Health

## 2014-06-06 ENCOUNTER — Encounter: Payer: Self-pay | Admitting: Internal Medicine

## 2014-06-06 ENCOUNTER — Ambulatory Visit (INDEPENDENT_AMBULATORY_CARE_PROVIDER_SITE_OTHER): Payer: BC Managed Care – PPO | Admitting: Internal Medicine

## 2014-06-06 DIAGNOSIS — I10 Essential (primary) hypertension: Secondary | ICD-10-CM

## 2014-06-06 MED ORDER — FAMOTIDINE 20 MG PO TABS: ORAL_TABLET | ORAL | Status: DC

## 2014-06-06 MED ORDER — PANTOPRAZOLE SODIUM 40 MG PO TBEC: 40.0000 mg | DELAYED_RELEASE_TABLET | Freq: Every day | ORAL | Status: DC

## 2014-06-06 MED ORDER — PHENDIMETRAZINE TARTRATE ER 105 MG PO CP24: 105.0000 mg | ORAL_CAPSULE | Freq: Every day | ORAL | Status: DC

## 2014-06-06 NOTE — Patient Instructions (Signed)
Keep up the good work with weight loss! We will see you back in about 2-3 months.

## 2014-06-06 NOTE — Progress Notes (Signed)
Pre visit review using our clinic review tool, if applicable. No additional management support is needed unless otherwise documented below in the visit note. 

## 2014-06-07 ENCOUNTER — Ambulatory Visit (INDEPENDENT_AMBULATORY_CARE_PROVIDER_SITE_OTHER): Payer: BC Managed Care – PPO | Admitting: Family Medicine

## 2014-06-07 ENCOUNTER — Encounter: Payer: Self-pay | Admitting: Family Medicine

## 2014-06-07 VITALS — BP 130/82 | HR 87 | Ht 63.0 in | Wt 283.0 lb

## 2014-06-07 DIAGNOSIS — M65311 Trigger thumb, right thumb: Secondary | ICD-10-CM

## 2014-06-07 NOTE — Progress Notes (Signed)
   Subjective:    Patient ID: [name], female    DOB: [birth year], 53 y.o.   MRN: [id number]  HPI The patient is a 53 year old female comes in today to follow-up on weight loss after starting a weight loss medication. She has been doing good with the medication decreasing her appetite and she lost about 7 pounds. She's not having any side effects of the medication. She is trying to work on exercise. Injection for her trigger thumb which has helped moderately.  Review of Systems  Constitutional: Negative for fever, appetite change and fatigue.  HENT: Negative.   Eyes: Negative.   Respiratory: Positive for shortness of breath. Negative for cough, chest tightness and wheezing.   Cardiovascular: Negative for chest pain, palpitations and leg swelling.  Gastrointestinal: Negative for abdominal pain, diarrhea, constipation and abdominal distention.  Musculoskeletal: Negative for myalgias, back pain, arthralgias and gait problem.  Skin: Negative.   Neurological: Negative for dizziness, weakness, light-headedness and headaches.      Objective:   Physical Exam  Constitutional: She is oriented to person, place, and time. She appears well-developed and well-nourished. No distress.  Morbidly obese  HENT:  Head: Normocephalic and atraumatic.  Eyes: EOM are normal.  Neck: Normal range of motion.  Cardiovascular: Normal rate and regular rhythm.   Pulmonary/Chest: Effort normal. No respiratory distress. She has no wheezes. She has no rales.  Breath sounds diminished due to habitus  Abdominal: Soft. Bowel sounds are normal. She exhibits no distension. There is no tenderness. There is no rebound.  Neurological: She is alert and oriented to person, place, and time. Coordination normal.  Skin: Skin is warm and dry.  Vitals reviewed.  Filed Vitals:   06/06/14 0944  BP: 142/80  Pulse: 94  Temp: 98.3 F (36.8 C)  TempSrc: Oral  Resp: 18  Height: 5\' 3"  (1.6 m)  Weight: 283 lb (128.368 kg)    SpO2: 98%      Assessment & Plan:

## 2014-06-07 NOTE — Assessment & Plan Note (Signed)
Patient is well at this time. We encourage her to continue with the conservative therapy. Patient follow-up on an as-needed basis.

## 2014-06-07 NOTE — Progress Notes (Signed)
  [name] [address] [place], [place] [address] Phone: [phone] Subjective:     CC:  Thumb pain. Right follow up  [id number] [name] Anita is a 53 y.o. female coming in with complaint of right thumb pain. Patient was seen previously was diagnosed with a trigger finger. Patient had an injection. Patient states that she is 98% better. States that it is minimally tender and minimally triggering. Patient is able to do all activities of daily living. 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 Denies any new symptoms.     Past medical history, social, surgical and family history all reviewed in electronic medical record.   Review of Systems: No headache, visual changes, nausea, vomiting, diarrhea, constipation, dizziness, abdominal pain, skin rash, fevers, chills, night sweats, weight loss, swollen lymph nodes, body aches, joint swelling, muscle aches, chest pain, shortness of breath, mood changes.   Objective Blood pressure 130/82, pulse 87, height 5\' 3"  (1.6 m), weight 283 lb (128.368 kg), SpO2 97 %.  General: No apparent distress alert and oriented x3 mood and affect normal, dressed appropriately.  HEENT: Pupils equal, extraocular movements intact  Respiratory: Patient's speak in full sentences and does not appear short of breath  Cardiovascular: No lower extremity edema, non tender, no erythema  Skin: Warm dry intact with no signs of infection or rash on extremities or on axial skeleton.  Abdomen: Soft nontender  Neuro: Cranial nerves II through XII are intact, neurovascularly intact in all extremities with 2+ DTRs and 2+ pulses.  Lymph: No lymphadenopathy of posterior or anterior cervical chain or axillae bilaterally.  Gait normal with good balance and coordination.  MSK:  Non tender with full range of motion and good stability and symmetric strength and tone of shoulders, elbows, wrist, hip, knee and ankles bilaterally.    Hand exam shows   Patient's right thumb has no triggering at this time. nontender on exam. Some mild tenderness at the [facility] joint.     Impression and Recommendations:     This case required medical decision making of moderate complexity.

## 2014-06-07 NOTE — Assessment & Plan Note (Signed)
Patient doing well on her weight loss medication. She has lost 7 pounds in the last month. Encouraged increased exercise as this will help with her weight loss. Currently she has modified only her diet via the anti-appetite effects of the medication. Talked with her about exercises that are friendly for people with problems with joints, morbid obesity. These include exercise bikes, water aerobics, yoga.

## 2014-06-07 NOTE — Assessment & Plan Note (Signed)
BP better today. We'll continue to monitor her progress with her weight loss as she may need decrease in her medications depending on her total weight loss.

## 2014-06-07 NOTE — Patient Instructions (Signed)
Verbal instructions given

## 2014-08-03 ENCOUNTER — Other Ambulatory Visit: Payer: Self-pay | Admitting: Adult Health

## 2014-08-03 NOTE — Telephone Encounter (Signed)
Will refill Micardis x1 with note to pharmacy to defer to PCP for future refills

## 2014-08-10 ENCOUNTER — Ambulatory Visit (INDEPENDENT_AMBULATORY_CARE_PROVIDER_SITE_OTHER): Payer: BLUE CROSS/BLUE SHIELD | Admitting: Internal Medicine

## 2014-08-10 ENCOUNTER — Encounter: Payer: Self-pay | Admitting: Internal Medicine

## 2014-08-10 MED ORDER — PHENDIMETRAZINE TARTRATE ER 105 MG PO CP24: 105.0000 mg | ORAL_CAPSULE | Freq: Every day | ORAL | Status: DC

## 2014-08-10 NOTE — Progress Notes (Signed)
Pre visit review using our clinic review tool, if applicable. No additional management support is needed unless otherwise documented below in the visit note. 

## 2014-08-10 NOTE — Assessment & Plan Note (Signed)
Will keep patient on weight loss medication for 3 more months and then likely will stop. She will return in 3 months. Talked to her about changing food habits and that she needs to start exercising to help with weight loss and to make healthy changes. Advised that after she stops the medicine if she is still in unhealthy habits she may gain the weight back.

## 2014-08-10 NOTE — Progress Notes (Signed)
   Subjective:    Patient ID: [name], female    DOB: 1960/08/24, 54 y.o.   MRN: [id number]  HPI The patient is a 54 YO female who is coming in to follow up on weight loss medication. She is still taking it without side effects and using that to eat less. She has not started exercising as advised last visit. She has lost 2 pounds but with the holidays was not able to do as much. She denies any other new complaints today.   Review of Systems  Constitutional: Negative for fever, appetite change and fatigue.  HENT: Negative.   Eyes: Negative.   Respiratory: Negative for cough, chest tightness, shortness of breath and wheezing.   Cardiovascular: Negative for chest pain, palpitations and leg swelling.  Gastrointestinal: Negative for abdominal pain, diarrhea, constipation and abdominal distention.  Musculoskeletal: Negative for myalgias, back pain, arthralgias and gait problem.  Skin: Negative.   Neurological: Negative for dizziness, weakness, light-headedness and headaches.      Objective:   Physical Exam  Constitutional: She is oriented to person, place, and time. She appears well-developed and well-nourished. No distress.  Morbidly obese  HENT:  Head: Normocephalic and atraumatic.  Eyes: EOM are normal.  Neck: Normal range of motion.  Cardiovascular: Normal rate and regular rhythm.   Pulmonary/Chest: Effort normal. No respiratory distress. She has no wheezes. She has no rales.  Abdominal: Soft. Bowel sounds are normal. She exhibits no distension. There is no tenderness. There is no rebound.  Neurological: She is alert and oriented to person, place, and time. Coordination normal.  Skin: Skin is warm and dry.  Vitals reviewed.  Filed Vitals:   08/10/14 0927  BP: 148/84  Pulse: 97  Temp: 98.7 F (37.1 C)  TempSrc: Oral  Resp: 16  Height: 5\' 3"  (1.6 m)  Weight: 281 lb (127.461 kg)  SpO2: 99%      Assessment & Plan:

## 2014-08-10 NOTE — Patient Instructions (Signed)
We want you to work on getting back into exercising as this is a very important part of losing weight.   We will see you back in about 3 months and that is likely when we will talk about if we can continue the weight loss medicine as it is only safe for about 6 months to be on. During this time you need to work on having healthy foods around and getting into good eating patterns because if you are not making changes then some of the weight will likely come back when we stop the medicine.   Exercise to Lose Weight Exercise and a healthy diet may help you lose weight. Your doctor may suggest specific exercises. EXERCISE IDEAS AND TIPS  Choose low-cost things you enjoy doing, such as walking, bicycling, or exercising to workout videos.  Take stairs instead of the elevator.  Walk during your lunch break.  Park your car further away from work or school.  Go to a gym or an exercise class.  Start with 5 to 10 minutes of exercise each day. Build up to 30 minutes of exercise 4 to 6 days a week.  Wear shoes with good support and comfortable clothes.  Stretch before and after working out.  Work out until you breathe harder and your heart beats faster.  Drink extra water when you exercise.  Do not do so much that you hurt yourself, feel dizzy, or get very short of breath. Exercises that burn about 150 calories:  Running 1  miles in 15 minutes.  Playing volleyball for 45 to 60 minutes.  Washing and waxing a car for 45 to 60 minutes.  Playing touch football for 45 minutes.  Walking 1  miles in 35 minutes.  Pushing a stroller 1  miles in 30 minutes.  Playing basketball for 30 minutes.  Raking leaves for 30 minutes.  Bicycling 5 miles in 30 minutes.  Walking 2 miles in 30 minutes.  Dancing for 30 minutes.  Shoveling snow for 15 minutes.  Swimming laps for 20 minutes.  Walking up stairs for 15 minutes.  Bicycling 4 miles in 15 minutes.  Gardening for 30 to 45  minutes.  Jumping rope for 15 minutes.  Washing windows or floors for 45 to 60 minutes. Document Released: 08/09/2010 Document Revised: 09/29/2011 Document Reviewed: 08/09/2010 [facility] Patient Information 2015 [place], [place]. This information is not intended to replace advice given to you by your health care provider. Make sure you discuss any questions you have with your health care provider.

## 2014-09-06 ENCOUNTER — Other Ambulatory Visit: Payer: Self-pay | Admitting: Adult Health

## 2014-09-06 ENCOUNTER — Telehealth: Payer: Self-pay | Admitting: Adult Health

## 2014-09-06 NOTE — Telephone Encounter (Signed)
Last refill that was sent to the pharmacy was advised to send to PCP for further refills.  This will need to be sent to Dr. Doug Sou.  Spoke with pharmacy and they will get this sent in. Nothing further is needed.

## 2014-09-07 NOTE — Telephone Encounter (Signed)
Micardis HCT was refilled by TP at ov with suggestion to follow up with PCP for future refills.  1 refill was given on 1.14.16 with note to pharmacy to defer future refills to PCP.  Rx was denied today with same note to pharmacy.

## 2014-09-08 ENCOUNTER — Telehealth: Payer: Self-pay | Admitting: Internal Medicine

## 2014-09-08 ENCOUNTER — Other Ambulatory Visit: Payer: Self-pay | Admitting: Geriatric Medicine

## 2014-09-08 MED ORDER — TELMISARTAN-HCTZ 80-25 MG PO TABS
1.0000 | ORAL_TABLET | Freq: Every day | ORAL | Status: DC
Start: 2014-09-08 — End: 2015-01-05

## 2014-09-08 NOTE — Telephone Encounter (Signed)
Please refill telmisartan-hydrochlorothiazide (MICARDIS HCT) 80-25 MG per tablet [[id number]] to [facility] on [address]

## 2014-09-08 NOTE — Telephone Encounter (Signed)
Sent to pharmacy 

## 2014-11-09 ENCOUNTER — Other Ambulatory Visit (INDEPENDENT_AMBULATORY_CARE_PROVIDER_SITE_OTHER): Payer: BLUE CROSS/BLUE SHIELD

## 2014-11-09 ENCOUNTER — Ambulatory Visit (INDEPENDENT_AMBULATORY_CARE_PROVIDER_SITE_OTHER): Payer: BLUE CROSS/BLUE SHIELD | Admitting: Internal Medicine

## 2014-11-09 ENCOUNTER — Encounter: Payer: Self-pay | Admitting: Internal Medicine

## 2014-11-09 DIAGNOSIS — I10 Essential (primary) hypertension: Secondary | ICD-10-CM | POA: Diagnosis not present

## 2014-11-09 LAB — COMPREHENSIVE METABOLIC PANEL
ALT: 9 U/L (ref 0–35)
AST: 12 U/L (ref 0–37)
Albumin: 3.7 g/dL (ref 3.5–5.2)
Alkaline Phosphatase: 129 U/L — ABNORMAL HIGH (ref 39–117)
BUN: 13 mg/dL (ref 6–23)
CO2: 27 mEq/L (ref 19–32)
Calcium: 9.1 mg/dL (ref 8.4–10.5)
Chloride: 104 mEq/L (ref 96–112)
Creatinine, Ser: 0.76 mg/dL (ref 0.40–1.20)
GFR: 101.73 mL/min (ref >60.00)
Glucose, Bld: 117 mg/dL — ABNORMAL HIGH (ref 70–99)
Potassium: 3.1 mEq/L — ABNORMAL LOW (ref 3.5–5.1)
Sodium: 139 mEq/L (ref 135–145)
Total Bilirubin: 0.4 mg/dL (ref 0.2–1.2)
Total Protein: 7.9 g/dL (ref 6.0–8.3)

## 2014-11-09 LAB — HEMOGLOBIN A1C: Hgb A1c MFr Bld: 5.8 % (ref 4.6–6.5)

## 2014-11-09 NOTE — Assessment & Plan Note (Signed)
Due to her poor diet and no exercise she did not do well with diet medication which she is now off. She has already regained some of the weight she lost. It is my hope that since her husband is now supposed to be on a diet as well they may make some lifestyle changes to facilitate weight loss. Reminded her that she needs to be exercising 6 times a week for 45 minutes per time to help with weight loss.

## 2014-11-09 NOTE — Progress Notes (Signed)
   Subjective:    Patient ID: [name], female    DOB: 16-Feb-1961, 54 y.o.   MRN: [id number]  HPI The patient is coming to follow up on her weight. She is off her weight loss medication and she has gained weigh since that time. She has been working on diet but has not been exercising. She states that she will start exercising now that her husband has been told to lose weight as well. He was holding her back with food as well since he still wanted his same food while she was trying to eat healthy. She denies any new problems including chest pains, SOB, headache, nausea. She has not taken her BP medications today yet.   Review of Systems  Constitutional: Negative for fever, appetite change and fatigue.  HENT: Negative.   Eyes: Negative.   Respiratory: Negative for cough, chest tightness, shortness of breath and wheezing.   Cardiovascular: Negative for chest pain, palpitations and leg swelling.  Gastrointestinal: Negative for abdominal pain, diarrhea, constipation and abdominal distention.  Musculoskeletal: Negative for myalgias, back pain, arthralgias and gait problem.  Skin: Negative.   Neurological: Negative for dizziness, weakness, light-headedness and headaches.      Objective:   Physical Exam  Constitutional: She is oriented to person, place, and time. She appears well-developed and well-nourished. No distress.  Morbidly obese  HENT:  Head: Normocephalic and atraumatic.  Eyes: EOM are normal.  Neck: Normal range of motion.  Cardiovascular: Normal rate and regular rhythm.   Pulmonary/Chest: Effort normal. No respiratory distress. She has no wheezes. She has no rales.  Abdominal: Soft. Bowel sounds are normal. She exhibits no distension. There is no tenderness. There is no rebound.  Neurological: She is alert and oriented to person, place, and time. Coordination normal.  Skin: Skin is warm and dry.  Vitals reviewed.  Filed Vitals:   11/09/14 0938 11/09/14 1006  BP: 148/96  144/102  Pulse: 104   Temp: 98.3 F (36.8 C)   Weight: 289 lb 12 oz (131.43 kg)   SpO2: 98%      Assessment & Plan:

## 2014-11-09 NOTE — Assessment & Plan Note (Addendum)
In moderate exacerbation today since she is off medicine and up in weight. Talked to her about the importance of taking her medicines everyday and not skipping doses as she clearly has hypertension. Last BP controlled on her medication. She is also up some weight which could be making her overall blood pressure control worse. Will see her back and if still high will likely need additional blood pressure agent. Checking labs to make sure she is not having end organ damage. No other signs such as headache, chest pains, nausea, confusion.

## 2014-11-09 NOTE — Progress Notes (Signed)
Patient received education resource, including the self-management goal and tool. Patient verbalized understanding. 

## 2014-11-09 NOTE — Patient Instructions (Signed)
We will check on some blood work today to make sure the diet pills did not cause you any harm.   Keep working with diet and exercise and the weight affects the blood pressure. When you weight goes up the blood pressure goes up and if it stays up we may need to add another medicine.   Come back in about 6 months to check on the weight and the blood pressure.   Calorie Counting for Weight Loss Calories are energy you get from the things you eat and drink. Your body uses this energy to keep you going throughout the day. The number of calories you eat affects your weight. When you eat more calories than your body needs, your body stores the extra calories as fat. When you eat fewer calories than your body needs, your body burns fat to get the energy it needs. Calorie counting means keeping track of how many calories you eat and drink each day. If you make sure to eat fewer calories than your body needs, you should lose weight. In order for calorie counting to work, you will need to eat the number of calories that are right for you in a day to lose a healthy amount of weight per week. A healthy amount of weight to lose per week is usually 1-2 lb (0.5-0.9 kg). A dietitian can determine how many calories you need in a day and give you suggestions on how to reach your calorie goal.  WHAT IS MY MY PLAN? My goal is to have __1800________ calories per day.  If I have this many calories per day, I should lose around __________ pounds per week. WHAT DO I NEED TO KNOW ABOUT CALORIE COUNTING? In order to meet your daily calorie goal, you will need to:  Find out how many calories are in each food you would like to eat. Try to do this before you eat.  Decide how much of the food you can eat.  Write down what you ate and how many calories it had. Doing this is called keeping a food log. WHERE DO I FIND CALORIE INFORMATION? The number of calories in a food can be found on a Nutrition Facts label. Note that all the  information on a label is based on a specific serving of the food. If a food does not have a Nutrition Facts label, try to look up the calories online or ask your dietitian for help. HOW DO I DECIDE HOW MUCH TO EAT? To decide how much of the food you can eat, you will need to consider both the number of calories in one serving and the size of one serving. This information can be found on the Nutrition Facts label. If a food does not have a Nutrition Facts label, look up the information online or ask your dietitian for help. Remember that calories are listed per serving. If you choose to have more than one serving of a food, you will have to multiply the calories per serving by the amount of servings you plan to eat. For example, the label on a package of bread might say that a serving size is 1 slice and that there are 90 calories in a serving. If you eat 1 slice, you will have eaten 90 calories. If you eat 2 slices, you will have eaten 180 calories. HOW DO I KEEP A FOOD LOG? After each meal, record the following information in your food log:  What you ate.  How much of it  you ate.  How many calories it had.  Then, add up your calories. Keep your food log near you, such as in a small notebook in your pocket. Another option is to use a mobile app or website. Some programs will calculate calories for you and show you how many calories you have left each time you add an item to the log. WHAT ARE SOME CALORIE COUNTING TIPS?  Use your calories on foods and drinks that will fill you up and not leave you hungry. Some examples of this include foods like nuts and nut butters, vegetables, lean proteins, and high-fiber foods (more than 5 g fiber per serving).  Eat nutritious foods and avoid empty calories. Empty calories are calories you get from foods or beverages that do not have many nutrients, such as candy and soda. It is better to have a nutritious high-calorie food (such as an avocado) than a food  with few nutrients (such as a bag of chips).  Know how many calories are in the foods you eat most often. This way, you do not have to look up how many calories they have each time you eat them.  Look out for foods that may seem like low-calorie foods but are really high-calorie foods, such as baked goods, soda, and fat-free candy.  Pay attention to calories in drinks. Drinks such as sodas, specialty coffee drinks, alcohol, and juices have a lot of calories yet do not fill you up. Choose low-calorie drinks like water and diet drinks.  Focus your calorie counting efforts on higher calorie items. Logging the calories in a garden salad that contains only vegetables is less important than calculating the calories in a milk shake.  Find a way of tracking calories that works for you. Get creative. Most people who are successful find ways to keep track of how much they eat in a day, even if they do not count every calorie. WHAT ARE SOME PORTION CONTROL TIPS?  Know how many calories are in a serving. This will help you know how many servings of a certain food you can have.  Use a measuring cup to measure serving sizes. This is helpful when you start out. With time, you will be able to estimate serving sizes for some foods.  Take some time to put servings of different foods on your favorite plates, bowls, and cups so you know what a serving looks like.  Try not to eat straight from a bag or box. Doing this can lead to overeating. Put the amount you would like to eat in a cup or on a plate to make sure you are eating the right portion.  Use smaller plates, glasses, and bowls to prevent overeating. This is a quick and easy way to practice portion control. If your plate is smaller, less food can fit on it.  Try not to multitask while eating, such as watching TV or using your computer. If it is time to eat, sit down at a table and enjoy your food. Doing this will help you to start recognizing when you are  full. It will also make you more aware of what and how much you are eating. HOW CAN I CALORIE COUNT WHEN EATING OUT?  Ask for smaller portion sizes or child-sized portions.  Consider sharing an entree and sides instead of getting your own entree.  If you get your own entree, eat only half. Ask for a box at the beginning of your meal and put the rest of  your entree in it so you are not tempted to eat it.  Look for the calories on the menu. If calories are listed, choose the lower calorie options.  Choose dishes that include vegetables, fruits, whole grains, low-fat dairy products, and lean protein. Focusing on smart food choices from each of the 5 food groups can help you stay on track at restaurants.  Choose items that are boiled, broiled, grilled, or steamed.  Choose water, milk, unsweetened iced tea, or other drinks without added sugars. If you want an alcoholic beverage, choose a lower calorie option. For example, a regular margarita can have up to 700 calories and a glass of wine has around 150.  Stay away from items that are buttered, battered, fried, or served with cream sauce. Items labeled "crispy" are usually fried, unless stated otherwise.  Ask for dressings, sauces, and syrups on the side. These are usually very high in calories, so do not eat much of them.  Watch out for salads. Many people think salads are a healthy option, but this is often not the case. Many salads come with bacon, fried chicken, lots of cheese, fried chips, and dressing. All of these items have a lot of calories. If you want a salad, choose a garden salad and ask for grilled meats or steak. Ask for the dressing on the side, or ask for olive oil and vinegar or lemon to use as dressing.  Estimate how many servings of a food you are given. For example, a serving of cooked rice is  cup or about the size of half a tennis ball or one cupcake wrapper. Knowing serving sizes will help you be aware of how much food you  are eating at restaurants. The list below tells you how big or small some common portion sizes are based on everyday objects.  1 oz--4 stacked dice.  3 oz--1 deck of cards.  1 tsp--1 dice.  1 Tbsp-- a Ping-Pong ball.  2 Tbsp--1 Ping-Pong ball.   cup--1 tennis ball or 1 cupcake wrapper.  1 cup--1 baseball. Document Released: 07/07/2005 Document Revised: 11/21/2013 Document Reviewed: 05/12/2013 [facility] Patient Information 2015 [place], [place]. This information is not intended to replace advice given to you by your health care provider. Make sure you discuss any questions you have with your health care provider.

## 2014-11-22 ENCOUNTER — Other Ambulatory Visit (INDEPENDENT_AMBULATORY_CARE_PROVIDER_SITE_OTHER): Payer: BLUE CROSS/BLUE SHIELD

## 2014-11-22 ENCOUNTER — Encounter: Payer: Self-pay | Admitting: Family Medicine

## 2014-11-22 ENCOUNTER — Ambulatory Visit (INDEPENDENT_AMBULATORY_CARE_PROVIDER_SITE_OTHER): Payer: BLUE CROSS/BLUE SHIELD | Admitting: Family Medicine

## 2014-11-22 VITALS — BP 136/84 | HR 98 | Ht 63.0 in | Wt 293.0 lb

## 2014-11-22 DIAGNOSIS — M79644 Pain in right finger(s): Secondary | ICD-10-CM | POA: Diagnosis not present

## 2014-11-22 DIAGNOSIS — M65311 Trigger thumb, right thumb: Secondary | ICD-10-CM

## 2014-11-22 NOTE — Assessment & Plan Note (Addendum)
Patient was given another injection today. We discussed other things during the daily activities and ergonomics the patient may want to change. We did a refresher course on home exercises. We discussed bracing at night. Patient and will come back and see me again in 2 weeks for further evaluation and treatment.   Spent  25 minutes with patient face-to-face and had greater than 50% of counseling including as described above in assessment and plan.

## 2014-11-22 NOTE — Progress Notes (Signed)
Pre visit review using our clinic review tool, if applicable. No additional management support is needed unless otherwise documented below in the visit note. 

## 2014-11-22 NOTE — Progress Notes (Signed)
  [name] [address] [place], [place] [address] Phone: [phone] Subjective:     CC:  Thumb pain. Right follow up  SUO:[id number] [name] is a 54 y.o. female coming in with complaint of right thumb pain. Patient did have a trigger thumb in last injected in November. Patient states that overall she is doing very well until last 3 weeks Will patient is actually having more triggering again. Discuss starting to affect some of her daily activities and is wondering if there is anything else that can be done.     Past medical history, social, surgical and family history all reviewed in electronic medical record.   Review of Systems: No headache, visual changes, nausea, vomiting, diarrhea, constipation, dizziness, abdominal pain, skin rash, fevers, chills, night sweats, weight loss, swollen lymph nodes, body aches, joint swelling, muscle aches, chest pain, shortness of breath, mood changes.   Objective Blood pressure 136/84, pulse 98, height 5\' 3"  (1.6 m), weight 293 lb (132.904 kg), SpO2 98 %.  General: No apparent distress alert and oriented x3 mood and affect normal, dressed appropriately.  HEENT: Pupils equal, extraocular movements intact  Respiratory: Patient's speak in full sentences and does not appear short of breath  Cardiovascular: No lower extremity edema, non tender, no erythema  Skin: Warm dry intact with no signs of infection or rash on extremities or on axial skeleton.  Abdomen: Soft nontender  Neuro: Cranial nerves II through XII are intact, neurovascularly intact in all extremities with 2+ DTRs and 2+ pulses.  Lymph: No lymphadenopathy of posterior or anterior cervical chain or axillae bilaterally.  Gait normal with good balance and coordination.  MSK:  Non tender with full range of motion and good stability and symmetric strength and tone of shoulders, elbows, wrist, hip, knee and ankles bilaterally.  Hand exam shows   Patient's  right thumb has triggering again. Patient is minimally tender over the [facility] joint where the nodule was noted. Continued mild tenderness over the [facility] joint as well. Patient is actually having triggering at this time as well.   Procedure note After verbal consent patient was prepped with alcohol swabs and with a 25-gauge 1 inch needle under ultrasound guidance patient did have an injection within the tendon sheath. Vision and 0.5 mL of 0.5% Marcaine and 0.5 mL a: 40 mg/dL injected. Patient tolerated the procedure well with complete resolution of pain but continued triggering.  Impression: Satisfactory ultrasound guidance injection of the flexor tendon sheath.   Impression and Recommendations:     This case required medical decision making of moderate complexity.

## 2014-11-22 NOTE — Patient Instructions (Signed)
Good to se eyou We injected the trigger thumb again Wear the brace at night for the next week Ice is your friend OK to massage the area See me again in 2 weeks if not better

## 2014-12-16 ENCOUNTER — Ambulatory Visit (INDEPENDENT_AMBULATORY_CARE_PROVIDER_SITE_OTHER): Payer: BLUE CROSS/BLUE SHIELD | Admitting: Family Medicine

## 2014-12-16 ENCOUNTER — Encounter: Payer: Self-pay | Admitting: Family Medicine

## 2014-12-16 VITALS — BP 132/68 | Temp 98.2°F | Wt 292.0 lb

## 2014-12-16 DIAGNOSIS — M79676 Pain in unspecified toe(s): Secondary | ICD-10-CM | POA: Insufficient documentation

## 2014-12-16 DIAGNOSIS — M79674 Pain in right toe(s): Secondary | ICD-10-CM

## 2014-12-16 MED ORDER — DOXYCYCLINE HYCLATE 100 MG PO TABS: 100.0000 mg | ORAL_TABLET | Freq: Two times a day (BID) | ORAL | Status: DC

## 2014-12-16 NOTE — Progress Notes (Signed)
R toe pain.  No clear trigger.  Noted pain w/o a known injury.  She trimmed the nail but it was sore.  1st toe.  Has drained watery/bloody material.  Pain with pressure on the area.  She can walk, if she is careful about foot placement.  No FCNAVD.    Meds, vitals, and allergies reviewed.   ROS: See HPI.  Otherwise, noncontributory.  nad R foot with normal inspection except for partially/sightly ingrown medial 1st nail.  Nail thickened but still has clear leading edge o/w that isn't ingrown. Soft tiddue puffy medially, slightly ttp Foot and toes NV intact

## 2014-12-16 NOTE — Patient Instructions (Signed)
Salt water soaks, try to let the nail grow out.  Start doxy in the meantime.  Take care.  Glad to see you.  Follow up with the foot clinic if not better.

## 2014-12-16 NOTE — Assessment & Plan Note (Signed)
Not fully ingrown.  Okay to try doxy with soaks.  She may resolve and not need resection, since she still has good lateral nail clearance.  D/w pt.  She agrees.  See AVS.

## 2014-12-20 ENCOUNTER — Telehealth: Payer: Self-pay | Admitting: Geriatric Medicine

## 2014-12-20 NOTE — Telephone Encounter (Signed)
Left message asking patient to call me back to let me know if she has had a mammogram recently.  

## 2015-01-05 ENCOUNTER — Other Ambulatory Visit: Payer: Self-pay | Admitting: Internal Medicine

## 2015-02-24 ENCOUNTER — Other Ambulatory Visit: Payer: Self-pay | Admitting: Internal Medicine

## 2015-02-26 ENCOUNTER — Other Ambulatory Visit: Payer: Self-pay

## 2015-02-26 MED ORDER — PANTOPRAZOLE SODIUM 40 MG PO TBEC
40.0000 mg | DELAYED_RELEASE_TABLET | Freq: Every day | ORAL | Status: DC
Start: 2015-02-26 — End: 2015-08-19

## 2015-03-06 ENCOUNTER — Other Ambulatory Visit: Payer: Self-pay

## 2015-03-06 MED ORDER — TELMISARTAN-HCTZ 80-25 MG PO TABS
1.0000 | ORAL_TABLET | Freq: Every day | ORAL | Status: DC
Start: 2015-03-06 — End: 2015-10-17

## 2015-05-11 ENCOUNTER — Ambulatory Visit (INDEPENDENT_AMBULATORY_CARE_PROVIDER_SITE_OTHER): Payer: BLUE CROSS/BLUE SHIELD | Admitting: Internal Medicine

## 2015-05-11 ENCOUNTER — Other Ambulatory Visit (INDEPENDENT_AMBULATORY_CARE_PROVIDER_SITE_OTHER): Payer: BLUE CROSS/BLUE SHIELD

## 2015-05-11 ENCOUNTER — Encounter: Payer: Self-pay | Admitting: Internal Medicine

## 2015-05-11 VITALS — BP 168/100 | HR 87 | Temp 97.5°F | Resp 14 | Ht 63.0 in | Wt 299.0 lb

## 2015-05-11 DIAGNOSIS — I10 Essential (primary) hypertension: Secondary | ICD-10-CM | POA: Diagnosis not present

## 2015-05-11 DIAGNOSIS — R0600 Dyspnea, unspecified: Secondary | ICD-10-CM | POA: Diagnosis not present

## 2015-05-11 DIAGNOSIS — J3089 Other allergic rhinitis: Secondary | ICD-10-CM | POA: Diagnosis not present

## 2015-05-11 DIAGNOSIS — J309 Allergic rhinitis, unspecified: Secondary | ICD-10-CM | POA: Insufficient documentation

## 2015-05-11 LAB — BASIC METABOLIC PANEL
BUN: 14 mg/dL (ref 6–23)
CO2: 29 mEq/L (ref 19–32)
Calcium: 9.4 mg/dL (ref 8.4–10.5)
Chloride: 103 mEq/L (ref 96–112)
Creatinine, Ser: 0.75 mg/dL (ref 0.40–1.20)
GFR: 103.11 mL/min (ref >60.00)
Glucose, Bld: 100 mg/dL — ABNORMAL HIGH (ref 70–99)
Potassium: 3.5 mEq/L (ref 3.5–5.1)
Sodium: 141 mEq/L (ref 135–145)

## 2015-05-11 MED ORDER — ALBUTEROL SULFATE 108 (90 BASE) MCG/ACT IN AEPB: 2.0000 | INHALATION_SPRAY | RESPIRATORY_TRACT | Status: DC | PRN

## 2015-05-11 NOTE — Assessment & Plan Note (Signed)
Talked to her about adding sinus rinses but she would rather see allergy specialist. She has been on allergy shots in the long ago past. Is taking flonase and zyrtec already.

## 2015-05-11 NOTE — Progress Notes (Signed)
Pre visit review using our clinic review tool, if applicable. No additional management support is needed unless otherwise documented below in the visit note. 

## 2015-05-11 NOTE — Patient Instructions (Signed)
We are checking your blood work today. Keep working on increasing exercise as you can with the albuterol inhaler. We will send you to an allergy specialist to see if they can get your breathing under control.

## 2015-05-11 NOTE — Assessment & Plan Note (Signed)
Moderately elevated although she is on telmisartan/hctz. She states that her BP is better at home when she checks. Suspect that her weight gain is causing the BP to be elevated. Previously tried on atenolol and amlodipine and has not done well. If still elevated at next check will likely add calcium channel blocker.

## 2015-05-11 NOTE — Assessment & Plan Note (Signed)
Unclear etiology although suspect some OHS. Previously responded to changing ACE-I to ARB. Suspect her weight gain over the last year has caused (and further limited her activity causing more gain) dyspnea.

## 2015-05-11 NOTE — Progress Notes (Signed)
   Subjective:    Patient ID: [name], female    DOB: Dec 08, 1960, 54 y.o.   MRN: [id number]  HPI The patient is a 55 YO female coming in for follow up on her breathing. She had struggled with SOB last year and changing ACE-I to ARB helped out. In the last 3-4 months she has noticed increasing SOB with exertion. She has gained about 10-20 pounds since last year. Especially worse right now with her allergies. She previously took allergy shots as a child. Has not in some time. Using flonase and zyrtec daily without full relief. Denies significant cough. No long travel recently.   Review of Systems  Constitutional: Negative for fever, appetite change and fatigue.  HENT: Positive for congestion, postnasal drip and rhinorrhea. Negative for sinus pressure.   Eyes: Negative.   Respiratory: Positive for shortness of breath. Negative for cough, chest tightness and wheezing.   Cardiovascular: Negative for chest pain, palpitations and leg swelling.  Gastrointestinal: Negative for abdominal pain, diarrhea, constipation and abdominal distention.  Musculoskeletal: Negative for myalgias, back pain, arthralgias and gait problem.  Skin: Negative.   Neurological: Negative for dizziness, weakness, light-headedness and headaches.      Objective:   Physical Exam  Constitutional: She is oriented to person, place, and time. She appears well-developed and well-nourished. No distress.  Morbidly obese  HENT:  Head: Normocephalic and atraumatic.  Redness of the oropharynx  Eyes: EOM are normal.  Neck: Normal range of motion.  Cardiovascular: Normal rate and regular rhythm.   Pulmonary/Chest: Effort normal. No respiratory distress. She has no wheezes. She has no rales.  Abdominal: Soft. Bowel sounds are normal. She exhibits no distension. There is no tenderness. There is no rebound.  Neurological: She is alert and oriented to person, place, and time. Coordination normal.  Skin: Skin is warm and dry.  Vitals  reviewed.  Filed Vitals:   05/11/15 0930 05/11/15 0959  BP: 158/100 168/100  Pulse: 87   Temp: 97.5 F (36.4 C)   TempSrc: Oral   Resp: 14   Height: 5\' 3"  (1.6 m)   Weight: 299 lb (135.626 kg)   SpO2: 96%       Assessment & Plan:

## 2015-06-05 ENCOUNTER — Ambulatory Visit
Admission: RE | Admit: 2015-06-05 | Discharge: 2015-06-05 | Disposition: A | Discharge status: Home / Self Care | Payer: BLUE CROSS/BLUE SHIELD | Source: Ambulatory Visit | Attending: Allergy | Admitting: Allergy

## 2015-06-05 ENCOUNTER — Other Ambulatory Visit: Payer: Self-pay | Admitting: Allergy

## 2015-06-05 DIAGNOSIS — R059 Cough, unspecified: Secondary | ICD-10-CM

## 2015-06-05 DIAGNOSIS — R05 Cough: Secondary | ICD-10-CM

## 2015-07-15 ENCOUNTER — Encounter (HOSPITAL_COMMUNITY): Payer: Self-pay | Admitting: Emergency Medicine

## 2015-07-15 ENCOUNTER — Emergency Department (HOSPITAL_COMMUNITY)
Admission: EM | Admit: 2015-07-15 | Discharge: 2015-07-15 | Disposition: A | Discharge status: Home / Self Care | Payer: BLUE CROSS/BLUE SHIELD | Attending: Emergency Medicine | Admitting: Emergency Medicine

## 2015-07-15 ENCOUNTER — Emergency Department (HOSPITAL_COMMUNITY): Payer: BLUE CROSS/BLUE SHIELD

## 2015-07-15 DIAGNOSIS — Z79899 Other long term (current) drug therapy: Secondary | ICD-10-CM | POA: Insufficient documentation

## 2015-07-15 DIAGNOSIS — S8261XA Displaced fracture of lateral malleolus of right fibula, initial encounter for closed fracture: Secondary | ICD-10-CM | POA: Insufficient documentation

## 2015-07-15 DIAGNOSIS — Y998 Other external cause status: Secondary | ICD-10-CM | POA: Diagnosis not present

## 2015-07-15 DIAGNOSIS — S8262XA Displaced fracture of lateral malleolus of left fibula, initial encounter for closed fracture: Secondary | ICD-10-CM

## 2015-07-15 DIAGNOSIS — W010XXA Fall on same level from slipping, tripping and stumbling without subsequent striking against object, initial encounter: Secondary | ICD-10-CM | POA: Diagnosis not present

## 2015-07-15 DIAGNOSIS — Z880 Allergy status to penicillin: Secondary | ICD-10-CM | POA: Insufficient documentation

## 2015-07-15 DIAGNOSIS — I10 Essential (primary) hypertension: Secondary | ICD-10-CM | POA: Insufficient documentation

## 2015-07-15 DIAGNOSIS — Z8709 Personal history of other diseases of the respiratory system: Secondary | ICD-10-CM | POA: Diagnosis not present

## 2015-07-15 DIAGNOSIS — Y92000 Kitchen of unspecified non-institutional (private) residence as  the place of occurrence of the external cause: Secondary | ICD-10-CM | POA: Diagnosis not present

## 2015-07-15 DIAGNOSIS — Z87891 Personal history of nicotine dependence: Secondary | ICD-10-CM | POA: Diagnosis not present

## 2015-07-15 DIAGNOSIS — Y9389 Activity, other specified: Secondary | ICD-10-CM | POA: Insufficient documentation

## 2015-07-15 DIAGNOSIS — S99911A Unspecified injury of right ankle, initial encounter: Secondary | ICD-10-CM | POA: Diagnosis present

## 2015-07-15 MED ORDER — HYDROCODONE-ACETAMINOPHEN 5-325 MG PO TABS
1.0000 | ORAL_TABLET | Freq: Once | ORAL | Status: AC
  Administered 2015-07-15: 1 via ORAL
  Filled 2015-07-15: qty 1

## 2015-07-15 MED ORDER — HYDROCODONE-ACETAMINOPHEN 5-325 MG PO TABS: ORAL_TABLET | ORAL | Status: DC

## 2015-07-15 NOTE — ED Notes (Signed)
Per pt, states she slipped and fell-now having right ankle pain

## 2015-07-15 NOTE — Discharge Instructions (Signed)
Do not bear any weight on the affected ankle until you are cleared by the orthopedist.  Rest, Ice intermittently (in the first 24-48 hours), Gentle compression with an Ace wrap, and elevate (Limb above the level of the heart)   For pain control please take Ibuprofen (also known as Motrin or Advil) 400mg  (this is normally 2 over the counter pills) every 6 hours. Take with food to minimize stomach irritation.  Take vicodin for breakthrough pain, do not drink alcohol, drive, care for children or do other critical tasks while taking vicodin.  Please be very careful not to fall! The pain medication and crutches  puts you at risk for falls. Please rest as much as possible and try to not stay alone.   Please follow with your primary care doctor in the next 2 days for a check-up. They must obtain records for further management.    Ankle Fracture A fracture is a break in a bone. The ankle joint is made up of three bones. These include the lower (distal)sections of your lower leg bones, called the tibia and fibula, along with a bone in your foot, called the talus. Depending on how bad the break is and if more than one ankle joint bone is broken, a cast or splint is used to protect and keep your injured bone from moving while it heals. Sometimes, surgery is required to help the fracture heal properly.  There are two general types of fractures:  Stable fracture. This includes a single fracture line through one bone, with no injury to ankle ligaments. A fracture of the talus that does not have any displacement (movement of the bone on either side of the fracture line) is also stable.  Unstable fracture. This includes more than one fracture line through one or more bones in the ankle joint. It also includes fractures that have displacement of the bone on either side of the fracture line. CAUSES  A direct blow to the ankle.   Quickly and severely twisting your ankle.  Trauma, such as a car accident or  falling from a significant height. RISK FACTORS You may be at a higher risk of ankle fracture if:  You have certain medical conditions.  You are involved in high-impact sports.  You are involved in a high-impact car accident. SIGNS AND SYMPTOMS   Tender and swollen ankle.  Bruising around the injured ankle.  Pain on movement of the ankle.  Difficulty walking or putting weight on the ankle.  A cold foot below the site of the ankle injury. This can occur if the blood vessels passing through your injured ankle were also damaged.  Numbness in the foot below the site of the ankle injury. DIAGNOSIS  An ankle fracture is usually diagnosed with a physical exam and X-rays. A CT scan may also be required for complex fractures. TREATMENT  Stable fractures are treated with a cast or splint and using crutches to avoid putting weight on your injured ankle. This is followed by an ankle strengthening program. Some patients require a special type of cast, depending on other medical problems they may have. Unstable fractures require surgery to ensure the bones heal properly. Your health care provider will tell you what type of fracture you have and the best treatment for your condition. HOME CARE INSTRUCTIONS   Review correct crutch use with your health care provider and use your crutches as directed. Safe use of crutches is extremely important. Misuse of crutches can cause you to fall or  cause injury to nerves in your hands or armpits.  Do not put weight or pressure on the injured ankle until directed by your health care provider.  To lessen the swelling, keep the injured leg elevated while sitting or lying down.  Apply ice to the injured area:  Put ice in a plastic bag.  Place a towel between your cast and the bag.  Leave the ice on for 20 minutes, 2-3 times a day.  If you have a plaster or fiberglass cast:  Do not try to scratch the skin under the cast with any objects. This can increase  your risk of skin infection.  Check the skin around the cast every day. You may put lotion on any red or sore areas.  Keep your cast dry and clean.  If you have a plaster splint:  Wear the splint as directed.  You may loosen the elastic around the splint if your toes become numb, tingle, or turn cold or blue.  Do not put pressure on any part of your cast or splint; it may break. Rest your cast only on a pillow the first 24 hours until it is fully hardened.  Your cast or splint can be protected during bathing with a plastic bag sealed to your skin with medical tape. Do not lower the cast or splint into water.  Take medicines as directed by your health care provider. Only take over-the-counter or prescription medicines for pain, discomfort, or fever as directed by your health care provider.  Do not drive a vehicle until your health care provider specifically tells you it is safe to do so.  If your health care provider has given you a follow-up appointment, it is very important to keep that appointment. Not keeping the appointment could result in a chronic or permanent injury, pain, and disability. If you have any problem keeping the appointment, call the facility for assistance. SEEK MEDICAL CARE IF: You develop increased swelling or discomfort. SEEK IMMEDIATE MEDICAL CARE IF:   Your cast gets damaged or breaks.  You have continued severe pain.  You develop new pain or swelling after the cast was put on.  Your skin or toenails below the injury turn blue or gray.  Your skin or toenails below the injury feel cold, numb, or have loss of sensitivity to touch.  There is a bad smell or pus draining from under the cast. MAKE SURE YOU:   Understand these instructions.  Will watch your condition.  Will get help right away if you are not doing well or get worse.   This information is not intended to replace advice given to you by your health care provider. Make sure you discuss any  questions you have with your health care provider.   Document Released: 07/04/2000 Document Revised: 07/12/2013 Document Reviewed: 02/03/2013 Elsevier Interactive Patient Education 2016 [place] not hesitate to return to the Emergency Department for any new, worsening or concerning symptoms.

## 2015-07-15 NOTE — ED Provider Notes (Signed)
CSN: [id number]     Arrival date & time 07/15/15  1155 History  By signing my name below, I, [facility], attest that this documentation has been prepared under the direction and in the presence of [employer], PA-C. Electronically Signed: Virgel Bouquet, ED Scribe. 07/15/2015. 12:40 PM.   Chief Complaint  Patient presents with  . Ankle Pain   The history is provided by the patient. No language interpreter was used.   HPI Comments: [name] is a 54 y.o. female with hx of HTN who presents to the Emergency Department complaining of 5/10, intermittent, aching right ankle pain onset this morning ago. Patient reports that she slipped in the kitchen and fell to the floor, followed gradually by pain. Pain is worse with bearing weight and movement of the ankle. She denies ambulating since injury. Patient denies seeing an orthopaedist in the past. She endorses an allergy to penicillin. Patient denies numbness.   Past Medical History  Diagnosis Date  . Hypertension   . Bronchitis    Past Surgical History  Procedure Laterality Date  . Abdominal hysterectomy    . Nasal sinus surgery    . Cyst removal hand     No family history on file. Social History  Substance Use Topics  . Smoking status: Former Smoker -- 1.00 packs/day for 35 years    Types: Cigarettes    Quit date: 09/18/2013  . Smokeless tobacco: Never Used  . Alcohol Use: No   OB History    No data available     Review of Systems A complete 10 system review of systems was obtained and all systems are negative except as noted in the HPI and PMH.   Allergies  Penicillins  Home Medications   Prior to Admission medications   Medication Sig Start Date End Date Taking? Authorizing Provider  acetaminophen (TYLENOL) 325 MG tablet Take 650 mg by mouth every 6 (six) hours as needed for moderate pain or headache. Per bottle as needed for pain/headache   Yes Historical Provider, MD  Albuterol Sulfate (PROAIR  RESPICLICK) 123XX123 (90 BASE) MCG/ACT AEPB Inhale 2 puffs into the lungs every 4 (four) hours as needed (SOB). 05/11/15  Yes Hoyt Koch, MD  azelastine (ASTELIN) 0.1 % nasal spray USE 1-2 PUFFS INTO EACH NOSTRIL TWICE A DAY 06/05/15  Yes Historical Provider, MD  cetirizine (ZYRTEC) 10 MG tablet Take 10 mg by mouth at bedtime as needed for allergies.   Yes Historical Provider, MD  fluticasone (FLONASE) 50 MCG/ACT nasal spray Place 2 sprays into both nostrils daily as needed.  12/13/13  Yes Historical Provider, MD  levocetirizine (XYZAL) 5 MG tablet Take 5 mg by mouth every evening. 07/02/15  Yes Historical Provider, MD  montelukast (SINGULAIR) 10 MG tablet Take 10 mg by mouth every evening. 07/02/15  Yes Historical Provider, MD  pantoprazole (PROTONIX) 40 MG tablet Take 1 tablet (40 mg total) by mouth daily. 02/26/15  Yes Hoyt Koch, MD  telmisartan-hydrochlorothiazide (MICARDIS HCT) 80-25 MG per tablet Take 1 tablet by mouth daily. 03/06/15  Yes Hoyt Koch, MD  famotidine (PEPCID) 20 MG tablet One at bedtime Patient not taking: Reported on 07/15/2015 06/06/14   Hoyt Koch, MD  HYDROcodone-acetaminophen (NORCO/VICODIN) 5-325 MG tablet Take 1-2 tablets by mouth every 6 hours as needed for pain and/or cough. 07/15/15   Prajna Vanderpool, PA-C   BP 162/105 mmHg  Pulse 102  Temp(Src) 97.4 F (36.3 C)  Resp 18  SpO2 99% Physical Exam  Constitutional: She is oriented to person, place, and time. She appears well-developed and well-nourished. No distress.  HENT:  Head: Normocephalic.  Eyes: Conjunctivae and EOM are normal.  Cardiovascular: Normal rate.   Pulmonary/Chest: Effort normal. No stridor.  Musculoskeletal: Normal range of motion. She exhibits edema and tenderness.  Right ankle is diffusely swollen with no ecchymoses, DP and PT pulses palpable, distally neurovascularly intact with excellent range of motion and sensation, cap refill is brisk 5. Patient is  tender to palpation on bilateral malleoli, lateral greater than medial.  Neurological: She is alert and oriented to person, place, and time.  Psychiatric: She has a normal mood and affect.  Nursing note and vitals reviewed.   ED Course  Procedures   DIAGNOSTIC STUDIES: Oxygen Saturation is 99% on RA, normal by my interpretation.    COORDINATION OF CARE: 12:30 PM Will order pain medication and apply ice. Advised pt to elevate right ankle tor reduce swelling. Will consult with orthpaedist on-call. Discussed results of right ankle x-ray with pt. Discussed treatment plan with pt at bedside and pt agreed to plan.  Labs Review Labs Reviewed - No data to display  Imaging Review Dg Ankle Complete Right  07/15/2015  CLINICAL DATA:  Status post slip and fall today with a right ankle injury and pain. The patient heard a pop. Initial encounter. EXAM: RIGHT ANKLE - COMPLETE 3+ VIEW COMPARISON:  None. FINDINGS: The patient has a lateral malleolar fracture with mild lateral displacement the distal fragment. The medial clear space is widened compatible with deltoid ligament injury. On the lateral view, an osseous fragment projects anterior to the tibiotalar joint but no donor site is seen. This may be a chronic finding. IMPRESSION: Acute lateral malleolar fracture. Widening of the medial clear space is compatible ligament injury. Electronically Signed   By: Inge Rise M.D.   On: 07/15/2015 12:22   I have personally reviewed and evaluated these images and lab results as part of my medical decision-making.   EKG Interpretation None      MDM   Final diagnoses:  Fracture of left ankle, lateral malleolus, closed, initial encounter    Filed Vitals:   07/15/15 1159  BP: 162/105  Pulse: 102  Temp: 97.4 F (36.3 C)  Resp: 18  SpO2: 99%    Medications  HYDROcodone-acetaminophen (NORCO/VICODIN) 5-325 MG per tablet 1 tablet (1 tablet Oral Given 07/15/15 1250)    [name] is 54 y.o.  female presenting with pain to right ankle status post mechanical fall. Swelling with no lacerations. Neurovascularly intact. Patient has been nonambulatory since the event secondary to pain. X-ray shows a lateral malleolus fracture with widening suggestive of ligamentous injury, reviewed x-ray with attending physician who recommends posterior and stirrup splinting with close orthopedic follow-up, no indication for emergent orthopedic consult in the ED.  Given crutches and instructed to elevate the ankle and no weightbearing until she is cleared by orthopedist.  Evaluation does not show pathology that would require ongoing emergent intervention or inpatient treatment. Pt is hemodynamically stable and mentating appropriately. Discussed findings and plan with patient/guardian, who agrees with care plan. All questions answered. Return precautions discussed and outpatient follow up given.   New Prescriptions   HYDROCODONE-ACETAMINOPHEN (NORCO/VICODIN) 5-325 MG TABLET    Take 1-2 tablets by mouth every 6 hours as needed for pain and/or cough.     I personally performed the services described in this documentation, which was scribed in my presence. The recorded information has been reviewed and  is accurate.   Monico Blitz, PA-C 07/15/15 1308  Fredia Sorrow, MD 07/15/15 1447

## 2015-07-27 ENCOUNTER — Encounter (HOSPITAL_COMMUNITY): Payer: Self-pay | Admitting: *Deleted

## 2015-07-27 ENCOUNTER — Ambulatory Visit: Payer: Self-pay | Admitting: Orthopedic Surgery

## 2015-07-30 ENCOUNTER — Ambulatory Visit (HOSPITAL_COMMUNITY): Payer: BLUE CROSS/BLUE SHIELD | Admitting: Anesthesiology

## 2015-07-30 ENCOUNTER — Observation Stay (HOSPITAL_COMMUNITY): Payer: BLUE CROSS/BLUE SHIELD

## 2015-07-30 ENCOUNTER — Encounter (HOSPITAL_COMMUNITY)
Admission: RE | Disposition: A | Discharge status: Home / Self Care | Payer: Self-pay | Source: Ambulatory Visit | Attending: Orthopedic Surgery

## 2015-07-30 ENCOUNTER — Encounter (HOSPITAL_COMMUNITY): Payer: Self-pay | Admitting: *Deleted

## 2015-07-30 ENCOUNTER — Observation Stay (HOSPITAL_COMMUNITY)
Admission: RE | Admit: 2015-07-30 | Discharge: 2015-07-31 | Disposition: A | Discharge status: Home / Self Care | Payer: BLUE CROSS/BLUE SHIELD | Source: Ambulatory Visit | Attending: Orthopedic Surgery | Admitting: Orthopedic Surgery

## 2015-07-30 ENCOUNTER — Ambulatory Visit (HOSPITAL_COMMUNITY): Payer: BLUE CROSS/BLUE SHIELD

## 2015-07-30 DIAGNOSIS — Z6841 Body Mass Index (BMI) 40.0 and over, adult: Secondary | ICD-10-CM | POA: Diagnosis not present

## 2015-07-30 DIAGNOSIS — Z09 Encounter for follow-up examination after completed treatment for conditions other than malignant neoplasm: Secondary | ICD-10-CM

## 2015-07-30 DIAGNOSIS — Z90710 Acquired absence of both cervix and uterus: Secondary | ICD-10-CM | POA: Diagnosis not present

## 2015-07-30 DIAGNOSIS — Z87891 Personal history of nicotine dependence: Secondary | ICD-10-CM | POA: Diagnosis not present

## 2015-07-30 DIAGNOSIS — I10 Essential (primary) hypertension: Secondary | ICD-10-CM | POA: Insufficient documentation

## 2015-07-30 DIAGNOSIS — S8261XA Displaced fracture of lateral malleolus of right fibula, initial encounter for closed fracture: Principal | ICD-10-CM | POA: Insufficient documentation

## 2015-07-30 DIAGNOSIS — J45909 Unspecified asthma, uncomplicated: Secondary | ICD-10-CM | POA: Insufficient documentation

## 2015-07-30 DIAGNOSIS — K219 Gastro-esophageal reflux disease without esophagitis: Secondary | ICD-10-CM | POA: Insufficient documentation

## 2015-07-30 DIAGNOSIS — W010XXA Fall on same level from slipping, tripping and stumbling without subsequent striking against object, initial encounter: Secondary | ICD-10-CM | POA: Diagnosis not present

## 2015-07-30 DIAGNOSIS — Z79899 Other long term (current) drug therapy: Secondary | ICD-10-CM | POA: Insufficient documentation

## 2015-07-30 DIAGNOSIS — R52 Pain, unspecified: Secondary | ICD-10-CM

## 2015-07-30 HISTORY — PX: ORIF ANKLE FRACTURE: SHX5408

## 2015-07-30 HISTORY — DX: Cardiac murmur, unspecified: R01.1

## 2015-07-30 HISTORY — DX: Other complications of anesthesia, initial encounter: T88.59XA

## 2015-07-30 HISTORY — DX: Headache, unspecified: R51.9

## 2015-07-30 HISTORY — DX: Reserved for inherently not codable concepts without codable children: IMO0001

## 2015-07-30 HISTORY — DX: Other allergic rhinitis: J30.89

## 2015-07-30 HISTORY — DX: Gastro-esophageal reflux disease without esophagitis: K21.9

## 2015-07-30 HISTORY — DX: Adverse effect of unspecified anesthetic, initial encounter: T41.45XA

## 2015-07-30 HISTORY — DX: Headache: R51

## 2015-07-30 LAB — CBC
HCT: 36.7 % (ref 36.0–46.0)
Hemoglobin: 11.6 g/dL — ABNORMAL LOW (ref 12.0–15.0)
MCH: 27.2 pg (ref 26.0–34.0)
MCHC: 31.6 g/dL (ref 30.0–36.0)
MCV: 86.2 fL (ref 78.0–100.0)
Platelets: 304 10*3/uL (ref 150–400)
RBC: 4.26 MIL/uL (ref 3.87–5.11)
RDW: 14.1 % (ref 11.5–15.5)
WBC: 7.1 10*3/uL (ref 4.0–10.5)

## 2015-07-30 LAB — BASIC METABOLIC PANEL
Anion gap: 11 (ref 5–15)
BUN: 10 mg/dL (ref 6–20)
CO2: 27 mmol/L (ref 22–32)
Calcium: 9.5 mg/dL (ref 8.9–10.3)
Chloride: 104 mmol/L (ref 101–111)
Creatinine, Ser: 0.76 mg/dL (ref 0.44–1.00)
GFR calc Af Amer: >60 mL/min (ref >60)
GFR calc non Af Amer: >60 mL/min (ref >60)
Glucose, Bld: 95 mg/dL (ref 65–99)
Potassium: 3.6 mmol/L (ref 3.5–5.1)
Sodium: 142 mmol/L (ref 135–145)

## 2015-07-30 LAB — SURGICAL PCR SCREEN
MRSA, PCR: NEGATIVE
Staphylococcus aureus: POSITIVE — AB

## 2015-07-30 SURGERY — OPEN REDUCTION INTERNAL FIXATION (ORIF) ANKLE FRACTURE
Anesthesia: General | Site: Ankle | Laterality: Right

## 2015-07-30 MED ORDER — CLINDAMYCIN PHOSPHATE 600 MG/50ML IV SOLN
600.0000 mg | Freq: Four times a day (QID) | INTRAVENOUS | Status: AC
  Administered 2015-07-30 – 2015-07-31 (×3): 600 mg via INTRAVENOUS
  Filled 2015-07-30 (×3): qty 50

## 2015-07-30 MED ORDER — PROPOFOL 10 MG/ML IV BOLUS
INTRAVENOUS | Status: DC | PRN
  Administered 2015-07-30: 200 mg via INTRAVENOUS

## 2015-07-30 MED ORDER — METOCLOPRAMIDE HCL 5 MG/ML IJ SOLN: 5.0000 mg | Freq: Three times a day (TID) | INTRAMUSCULAR | Status: DC | PRN

## 2015-07-30 MED ORDER — MORPHINE SULFATE (PF) 2 MG/ML IV SOLN
2.0000 mg | INTRAVENOUS | Status: DC | PRN
  Administered 2015-07-30 – 2015-07-31 (×2): 2 mg via INTRAVENOUS
  Filled 2015-07-30 (×2): qty 1

## 2015-07-30 MED ORDER — AZELASTINE HCL 0.1 % NA SOLN
1.0000 | Freq: Two times a day (BID) | NASAL | Status: DC
  Filled 2015-07-30: qty 30

## 2015-07-30 MED ORDER — ONDANSETRON HCL 4 MG/2ML IJ SOLN: 4.0000 mg | Freq: Four times a day (QID) | INTRAMUSCULAR | Status: DC | PRN

## 2015-07-30 MED ORDER — LABETALOL HCL 5 MG/ML IV SOLN
INTRAVENOUS | Status: DC | PRN
  Administered 2015-07-30: 5 mg via INTRAVENOUS
  Administered 2015-07-30: 2.5 mg via INTRAVENOUS

## 2015-07-30 MED ORDER — HYDROMORPHONE HCL 1 MG/ML IJ SOLN
INTRAMUSCULAR | Status: DC | PRN
  Administered 2015-07-30: 1 mg via INTRAVENOUS

## 2015-07-30 MED ORDER — 0.9 % SODIUM CHLORIDE (POUR BTL) OPTIME
TOPICAL | Status: DC | PRN
  Administered 2015-07-30: 2000 mL

## 2015-07-30 MED ORDER — CETIRIZINE HCL 10 MG PO TABS
10.0000 mg | ORAL_TABLET | Freq: Every evening | ORAL | Status: DC
  Filled 2015-07-30: qty 1

## 2015-07-30 MED ORDER — DOCUSATE SODIUM 100 MG PO CAPS
100.0000 mg | ORAL_CAPSULE | Freq: Two times a day (BID) | ORAL | Status: DC
  Administered 2015-07-30 – 2015-07-31 (×2): 100 mg via ORAL

## 2015-07-30 MED ORDER — FENTANYL CITRATE (PF) 100 MCG/2ML IJ SOLN
INTRAMUSCULAR | Status: DC | PRN
  Administered 2015-07-30: 100 ug via INTRAVENOUS
  Administered 2015-07-30 (×5): 50 ug via INTRAVENOUS

## 2015-07-30 MED ORDER — HYDROMORPHONE HCL 1 MG/ML IJ SOLN
0.2500 mg | INTRAMUSCULAR | Status: DC | PRN
  Administered 2015-07-30: 0.25 mg via INTRAVENOUS

## 2015-07-30 MED ORDER — CLINDAMYCIN PHOSPHATE 900 MG/50ML IV SOLN
INTRAVENOUS | Status: AC
  Filled 2015-07-30: qty 50

## 2015-07-30 MED ORDER — KETOROLAC TROMETHAMINE 15 MG/ML IJ SOLN
15.0000 mg | Freq: Four times a day (QID) | INTRAMUSCULAR | Status: DC
  Administered 2015-07-30 – 2015-07-31 (×3): 15 mg via INTRAVENOUS
  Filled 2015-07-30 (×4): qty 1

## 2015-07-30 MED ORDER — HYDROCHLOROTHIAZIDE 25 MG PO TABS
25.0000 mg | ORAL_TABLET | Freq: Every day | ORAL | Status: DC
  Administered 2015-07-31: 25 mg via ORAL
  Filled 2015-07-30: qty 1

## 2015-07-30 MED ORDER — METOCLOPRAMIDE HCL 10 MG PO TABS: 5.0000 mg | ORAL_TABLET | Freq: Three times a day (TID) | ORAL | Status: DC | PRN

## 2015-07-30 MED ORDER — FENTANYL CITRATE (PF) 100 MCG/2ML IJ SOLN
INTRAMUSCULAR | Status: AC
  Filled 2015-07-30: qty 2

## 2015-07-30 MED ORDER — SUGAMMADEX SODIUM 500 MG/5ML IV SOLN
INTRAVENOUS | Status: DC | PRN
  Administered 2015-07-30: 275 mg via INTRAVENOUS

## 2015-07-30 MED ORDER — TELMISARTAN-HCTZ 80-25 MG PO TABS: 1.0000 | ORAL_TABLET | Freq: Every day | ORAL | Status: DC

## 2015-07-30 MED ORDER — GLYCOPYRROLATE 0.2 MG/ML IJ SOLN
INTRAMUSCULAR | Status: AC
  Filled 2015-07-30: qty 1

## 2015-07-30 MED ORDER — SODIUM CHLORIDE 0.9 % IV SOLN
INTRAVENOUS | Status: DC
  Administered 2015-07-30: 21:00:00 via INTRAVENOUS

## 2015-07-30 MED ORDER — ACETAMINOPHEN 325 MG PO TABS: 650.0000 mg | ORAL_TABLET | Freq: Four times a day (QID) | ORAL | Status: DC | PRN

## 2015-07-30 MED ORDER — PANTOPRAZOLE SODIUM 40 MG PO TBEC
40.0000 mg | DELAYED_RELEASE_TABLET | Freq: Every day | ORAL | Status: DC
  Administered 2015-07-31: 40 mg via ORAL
  Filled 2015-07-30: qty 1

## 2015-07-30 MED ORDER — SUCCINYLCHOLINE CHLORIDE 20 MG/ML IJ SOLN
INTRAMUSCULAR | Status: DC | PRN
  Administered 2015-07-30: 200 mg via INTRAVENOUS

## 2015-07-30 MED ORDER — MIDAZOLAM HCL 2 MG/2ML IJ SOLN
INTRAMUSCULAR | Status: AC
  Filled 2015-07-30: qty 2

## 2015-07-30 MED ORDER — LABETALOL HCL 5 MG/ML IV SOLN
INTRAVENOUS | Status: AC
  Filled 2015-07-30: qty 4

## 2015-07-30 MED ORDER — ROCURONIUM BROMIDE 100 MG/10ML IV SOLN
INTRAVENOUS | Status: AC
  Filled 2015-07-30: qty 1

## 2015-07-30 MED ORDER — SODIUM CHLORIDE 0.9 % IV SOLN: INTRAVENOUS | Status: DC

## 2015-07-30 MED ORDER — CLINDAMYCIN PHOSPHATE 900 MG/50ML IV SOLN
900.0000 mg | INTRAVENOUS | Status: AC
  Administered 2015-07-30: 900 mg via INTRAVENOUS

## 2015-07-30 MED ORDER — LIDOCAINE HCL (CARDIAC) 20 MG/ML IV SOLN
INTRAVENOUS | Status: DC | PRN
  Administered 2015-07-30: 50 mg via INTRAVENOUS

## 2015-07-30 MED ORDER — ISOPROPYL ALCOHOL 70 % SOLN
Status: AC
  Filled 2015-07-30: qty 480

## 2015-07-30 MED ORDER — LIDOCAINE HCL (CARDIAC) 20 MG/ML IV SOLN
INTRAVENOUS | Status: AC
  Filled 2015-07-30: qty 5

## 2015-07-30 MED ORDER — IRBESARTAN 150 MG PO TABS
150.0000 mg | ORAL_TABLET | Freq: Every day | ORAL | Status: DC
  Administered 2015-07-31: 150 mg via ORAL
  Filled 2015-07-30: qty 1

## 2015-07-30 MED ORDER — MIDAZOLAM HCL 5 MG/5ML IJ SOLN
INTRAMUSCULAR | Status: DC | PRN
  Administered 2015-07-30: 2 mg via INTRAVENOUS

## 2015-07-30 MED ORDER — HYDROCODONE-ACETAMINOPHEN 5-325 MG PO TABS
1.0000 | ORAL_TABLET | ORAL | Status: DC | PRN
  Administered 2015-07-30: 1 via ORAL
  Administered 2015-07-31: 2 via ORAL
  Filled 2015-07-30: qty 2
  Filled 2015-07-30: qty 1

## 2015-07-30 MED ORDER — FENTANYL CITRATE (PF) 250 MCG/5ML IJ SOLN
INTRAMUSCULAR | Status: AC
  Filled 2015-07-30: qty 5

## 2015-07-30 MED ORDER — ONDANSETRON HCL 4 MG PO TABS: 4.0000 mg | ORAL_TABLET | Freq: Four times a day (QID) | ORAL | Status: DC | PRN

## 2015-07-30 MED ORDER — SENNA 8.6 MG PO TABS
1.0000 | ORAL_TABLET | Freq: Two times a day (BID) | ORAL | Status: DC
  Administered 2015-07-30 – 2015-07-31 (×2): 8.6 mg via ORAL

## 2015-07-30 MED ORDER — FLUTICASONE PROPIONATE 50 MCG/ACT NA SUSP
2.0000 | Freq: Every day | NASAL | Status: DC | PRN
  Filled 2015-07-30: qty 16

## 2015-07-30 MED ORDER — LACTATED RINGERS IV SOLN
INTRAVENOUS | Status: DC
  Administered 2015-07-30: 20:00:00 via INTRAVENOUS

## 2015-07-30 MED ORDER — CHLORHEXIDINE GLUCONATE 4 % EX LIQD: 60.0000 mL | Freq: Once | CUTANEOUS | Status: DC

## 2015-07-30 MED ORDER — ENOXAPARIN SODIUM 40 MG/0.4ML ~~LOC~~ SOLN
40.0000 mg | SUBCUTANEOUS | Status: DC
  Administered 2015-07-31: 40 mg via SUBCUTANEOUS
  Filled 2015-07-30: qty 0.4

## 2015-07-30 MED ORDER — ADULT MULTIVITAMIN W/MINERALS CH
1.0000 | ORAL_TABLET | Freq: Every day | ORAL | Status: DC
  Administered 2015-07-31: 1 via ORAL
  Filled 2015-07-30: qty 1

## 2015-07-30 MED ORDER — HYDROMORPHONE HCL 1 MG/ML IJ SOLN
INTRAMUSCULAR | Status: AC
  Filled 2015-07-30: qty 1

## 2015-07-30 MED ORDER — SUGAMMADEX SODIUM 500 MG/5ML IV SOLN
INTRAVENOUS | Status: AC
  Filled 2015-07-30: qty 5

## 2015-07-30 MED ORDER — ALBUTEROL SULFATE (2.5 MG/3ML) 0.083% IN NEBU: 2.5000 mg | INHALATION_SOLUTION | RESPIRATORY_TRACT | Status: DC | PRN

## 2015-07-30 MED ORDER — MONTELUKAST SODIUM 10 MG PO TABS
10.0000 mg | ORAL_TABLET | Freq: Every evening | ORAL | Status: DC
  Filled 2015-07-30: qty 1

## 2015-07-30 MED ORDER — LACTATED RINGERS IV SOLN
INTRAVENOUS | Status: DC | PRN
  Administered 2015-07-30 (×2): via INTRAVENOUS

## 2015-07-30 MED ORDER — ACETAMINOPHEN 650 MG RE SUPP: 650.0000 mg | Freq: Four times a day (QID) | RECTAL | Status: DC | PRN

## 2015-07-30 MED ORDER — ROCURONIUM BROMIDE 100 MG/10ML IV SOLN
INTRAVENOUS | Status: DC | PRN
  Administered 2015-07-30: 50 mg via INTRAVENOUS
  Administered 2015-07-30: 10 mg via INTRAVENOUS

## 2015-07-30 SURGICAL SUPPLY — 64 items
BAG ZIPLOCK 12X15 (MISCELLANEOUS) ×2 IMPLANT
BANDAGE ACE 4X5 VEL STRL LF (GAUZE/BANDAGES/DRESSINGS) ×2 IMPLANT
BANDAGE ACE 6X5 VEL STRL LF (GAUZE/BANDAGES/DRESSINGS) ×2 IMPLANT
BIT DRILL 2.5X2.75 QC CALB (BIT) ×2 IMPLANT
BIT DRILL 3.5X5.5 QC CALB (BIT) ×2 IMPLANT
BIT DRILL CALIBRATED 2.7 (BIT) ×2 IMPLANT
BNDG PLASTER X FAST 5X5 WHT LF (CAST SUPPLIES) ×10 IMPLANT
CUFF TOURN SGL QUICK 44 (TOURNIQUET CUFF) ×2 IMPLANT
DRAPE C-ARM 42X120 X-RAY (DRAPES) ×2 IMPLANT
DRAPE U-SHAPE 47X51 STRL (DRAPES) ×2 IMPLANT
DRILL SLEEVE 2.7 DIST TIB (TRAUMA) ×1
DRSG ADAPTIC 3X8 NADH LF (GAUZE/BANDAGES/DRESSINGS) ×2 IMPLANT
DRSG EMULSION OIL 3X16 NADH (GAUZE/BANDAGES/DRESSINGS) ×2 IMPLANT
DRSG PAD ABDOMINAL 8X10 ST (GAUZE/BANDAGES/DRESSINGS) ×2 IMPLANT
DURAPREP 26ML APPLICATOR (WOUND CARE) ×2 IMPLANT
ELECT REM PT RETURN 9FT ADLT (ELECTROSURGICAL) ×2
ELECTRODE REM PT RTRN 9FT ADLT (ELECTROSURGICAL) ×1 IMPLANT
GAUZE SPONGE 4X4 12PLY STRL (GAUZE/BANDAGES/DRESSINGS) ×2 IMPLANT
GLOVE BIO SURGEON STRL SZ8.5 (GLOVE) ×6 IMPLANT
GLOVE BIOGEL PI IND STRL 7.5 (GLOVE) ×1 IMPLANT
GLOVE BIOGEL PI IND STRL 8.5 (GLOVE) ×1 IMPLANT
GLOVE BIOGEL PI INDICATOR 7.5 (GLOVE) ×1
GLOVE BIOGEL PI INDICATOR 8.5 (GLOVE) ×1
GLOVE ECLIPSE 8.0 STRL XLNG CF (GLOVE) IMPLANT
GLOVE ORTHO TXT STRL SZ7.5 (GLOVE) ×4 IMPLANT
GLOVE SURG ORTHO 8.0 STRL STRW (GLOVE) ×2 IMPLANT
GOWN SPEC L3 XXLG W/TWL (GOWN DISPOSABLE) ×4 IMPLANT
GOWN STRL REUS W/TWL LRG LVL3 (GOWN DISPOSABLE) ×4 IMPLANT
K-WIRE ACE 1.6X6 (WIRE) ×4
KWIRE ACE 1.6X6 (WIRE) ×2 IMPLANT
MANIFOLD NEPTUNE II (INSTRUMENTS) ×2 IMPLANT
NS IRRIG 1000ML POUR BTL (IV SOLUTION) ×2 IMPLANT
PACK TOTAL JOINT (CUSTOM PROCEDURE TRAY) ×2 IMPLANT
PAD ABD 8X10 STRL (GAUZE/BANDAGES/DRESSINGS) ×2 IMPLANT
PAD CAST 4YDX4 CTTN HI CHSV (CAST SUPPLIES) ×1 IMPLANT
PADDING CAST COTTON 4X4 STRL (CAST SUPPLIES) ×1
PADDING CAST COTTON 6X4 STRL (CAST SUPPLIES) ×4 IMPLANT
PLATE LOCK 4H 109 RT DIST FIB (Plate) ×2 IMPLANT
POSITIONER SURGICAL ARM (MISCELLANEOUS) ×2 IMPLANT
SCREW CORT T15 24X3.5XST LCK (Screw) ×1 IMPLANT
SCREW CORTICAL 3.5MM  28MM (Screw) ×1 IMPLANT
SCREW CORTICAL 3.5MM 28MM (Screw) ×1 IMPLANT
SCREW CORTICAL 3.5X24MM (Screw) ×1 IMPLANT
SCREW LOCK CORT STAR 3.5X12 (Screw) ×2 IMPLANT
SCREW LOCK CORT STAR 3.5X14 (Screw) ×8 IMPLANT
SCREW LOCK CORT STAR 3.5X16 (Screw) ×2 IMPLANT
SCREW NON LOCKING LP 3.5 16MM (Screw) ×4 IMPLANT
SLEEVE DRILL 2.7 DIST TIB (TRAUMA) ×1 IMPLANT
SPLINT PLASTER CAST XFAST 5X30 (CAST SUPPLIES) ×3 IMPLANT
SPLINT PLASTER EXTRA FAST 3X15 (CAST SUPPLIES) ×10
SPLINT PLASTER GYPS XFAST 3X15 (CAST SUPPLIES) ×10 IMPLANT
SPLINT PLASTER XFAST SET 5X30 (CAST SUPPLIES) ×3
STAPLER VISISTAT 35W (STAPLE) ×2 IMPLANT
STRIP CLOSURE SKIN 1/2X4 (GAUZE/BANDAGES/DRESSINGS) IMPLANT
SUT ETHILON 3 0 PS 1 (SUTURE) ×2 IMPLANT
SUT MNCRL AB 4-0 PS2 18 (SUTURE) ×2 IMPLANT
SUT VIC AB 1 CT1 27 (SUTURE) ×2
SUT VIC AB 1 CT1 27XBRD ANTBC (SUTURE) ×2 IMPLANT
SUT VIC AB 2-0 CT1 27 (SUTURE) ×1
SUT VIC AB 2-0 CT1 TAPERPNT 27 (SUTURE) ×1 IMPLANT
SUT VIC AB 3-0 CT1 27 (SUTURE) ×2
SUT VIC AB 3-0 CT1 TAPERPNT 27 (SUTURE) ×2 IMPLANT
TOWEL OR 17X26 10 PK STRL BLUE (TOWEL DISPOSABLE) ×4 IMPLANT
YANKAUER SUCT BULB TIP 10FT TU (MISCELLANEOUS) ×2 IMPLANT

## 2015-07-30 NOTE — Anesthesia Preprocedure Evaluation (Addendum)
Anesthesia Evaluation  Patient identified by MRN, date of birth, ID band Patient awake    Reviewed: Allergy & Precautions, H&P , NPO status , Patient's Chart, lab work & pertinent test results  Airway Mallampati: III  TM Distance: >3 FB Neck ROM: full    Dental no notable dental hx.    Pulmonary asthma (Rare inhaler use) , former smoker,    Pulmonary exam normal breath sounds clear to auscultation       Cardiovascular Exercise Tolerance: Good hypertension, On Medications  Rhythm:regular Rate:Normal     Neuro/Psych negative neurological ROS  negative psych ROS   GI/Hepatic negative GI ROS, Neg liver ROS,   Endo/Other  Morbid obesity  Renal/GU negative Renal ROS  negative genitourinary   Musculoskeletal negative musculoskeletal ROS (+)   Abdominal   Peds negative pediatric ROS (+)  Hematology negative hematology ROS (+)   Anesthesia Other Findings   Reproductive/Obstetrics negative OB ROS                           Anesthesia Physical Anesthesia Plan  ASA: III  Anesthesia Plan: General   Post-op Pain Management:    Induction: Intravenous  Airway Management Planned: Oral ETT  Additional Equipment:   Intra-op Plan:   Post-operative Plan:   Informed Consent: I have reviewed the patients History and Physical, chart, labs and discussed the procedure including the risks, benefits and alternatives for the proposed anesthesia with the patient or authorized representative who has indicated his/her understanding and acceptance.   Dental Advisory Given  Plan Discussed with: CRNA  Anesthesia Plan Comments: ( Discussed  general anesthesia, including possible nausea, instrumentation of airway, sore throat,pulmonary aspiration, etc. I asked if the were any outstanding questions, or  concerns before we proceeded. )      Anesthesia Quick Evaluation

## 2015-07-30 NOTE — Anesthesia Procedure Notes (Signed)
Procedure Name: Intubation Date/Time: 07/30/2015 5:37 PM Performed by: Anne Fu Pre-anesthesia Checklist: Patient identified, Emergency Drugs available, Suction available, Patient being monitored and Timeout performed Patient Re-evaluated:Patient Re-evaluated prior to inductionOxygen Delivery Method: Circle system utilized Preoxygenation: Pre-oxygenation with 100% oxygen Intubation Type: IV induction Ventilation: Mask ventilation without difficulty Laryngoscope Size: Mac and 4 Grade View: Grade I Tube type: Oral Tube size: 7.5 mm Number of attempts: 1 Airway Equipment and Method: Stylet Placement Confirmation: ETT inserted through vocal cords under direct vision,  positive ETCO2,  CO2 detector and breath sounds checked- equal and bilateral Secured at: 19 cm Tube secured with: Tape Dental Injury: Teeth and Oropharynx as per pre-operative assessment

## 2015-07-30 NOTE — Discharge Instructions (Signed)
-  Non weight bearing right leg -keep splint clean and dry. Do not remove splint. -ice, elevation

## 2015-07-30 NOTE — Plan of Care (Signed)
Problem: Safety: Goal: Ability to remain free from injury will improve Outcome: Progressing Bed alarm on for high falls risk.

## 2015-07-30 NOTE — Transfer of Care (Signed)
Immediate Anesthesia Transfer of Care Note  Patient: [name]  Procedure(s) Performed: Procedure(s): OPEN REDUCTION INTERNAL FIXATION (ORIF) RIGHT  ANKLE  (Right)  Patient Location: PACU  Anesthesia Type:General  Level of Consciousness:  sedated, patient cooperative and responds to stimulation  Airway & Oxygen Therapy:Patient Spontanous Breathing and Patient connected to face mask oxgen  Post-op Assessment:  Report given to PACU RN and Post -op Vital signs reviewed and stable  Post vital signs:  Reviewed and stable  Last Vitals:  Filed Vitals:   07/30/15 1059 07/30/15 2000  BP: 164/93 140/78  Pulse: 98 79  Temp: 36.7 C 37 C  Resp: 18     Complications: No apparent anesthesia complications

## 2015-07-30 NOTE — H&P (Signed)
PREOPERATIVE H&P  Chief Complaint: right lateral malleolar fracture   HPI: [name] is a 55 y.o. female who presents for preoperative history and physical with a diagnosis of right lateral malleolar fracture.  She has elected for surgical management due to the unstable nature of her fracture.   Past Medical History  Diagnosis Date  . Hypertension   . Bronchitis   . Complication of anesthesia     hard to wake up years ago  . Shortness of breath dyspnea     with exertion  . Environmental and seasonal allergies     dust mites, dust, cats,trees,  . GERD (gastroesophageal reflux disease)   . Headache   . Heart murmur    Past Surgical History  Procedure Laterality Date  . Abdominal hysterectomy    . Nasal sinus surgery    . Cyst removal hand     Social History   Social History  . Marital Status: Married    Spouse Name: N/A  . Number of Children: N/A  . Years of Education: N/A   Occupational History  . Housewife     Social History Main Topics  . Smoking status: Former Smoker -- 1.00 packs/day for 35 years    Types: Cigarettes    Quit date: 09/18/2013  . Smokeless tobacco: Never Used  . Alcohol Use: No  . Drug Use: No  . Sexual Activity: Not Asked   Other Topics Concern  . None   Social History Narrative   Was in the [employer] x1 year   History reviewed. No pertinent family history. Allergies  Allergen Reactions  . Penicillins Hives    Has patient had a PCN reaction causing immediate rash, facial/tongue/throat swelling, SOB or lightheadedness with hypotension: Yes Has patient had a PCN reaction causing severe rash involving mucus membranes or skin necrosis: Yes Has patient had a PCN reaction that required hospitalization: Yes Has patient had a PCN reaction occurring within the last 10 years: Yes If all of the above answers are "NO", then may proceed with Cephalosporin use.    Prior to Admission medications   Medication Sig Start Date End Date Taking?  Authorizing Provider  acetaminophen (TYLENOL) 325 MG tablet Take 650 mg by mouth every 6 (six) hours as needed for moderate pain or headache. Per bottle as needed for pain/headache   Yes Historical Provider, MD  Albuterol Sulfate (PROAIR RESPICLICK) 123XX123 (90 BASE) MCG/ACT AEPB Inhale 2 puffs into the lungs every 4 (four) hours as needed (SOB). 05/11/15  Yes Hoyt Koch, MD  azelastine (ASTELIN) 0.1 % nasal spray USE 1 PUFFS INTO EACH NOSTRIL ONCE A DAY AS NEEDED FOR ALLERGIES. 06/05/15  Yes Historical Provider, MD  fluticasone (FLONASE) 50 MCG/ACT nasal spray Place 2 sprays into both nostrils daily as needed for allergies.  12/13/13  Yes Historical Provider, MD  levocetirizine (XYZAL) 5 MG tablet Take 5 mg by mouth every evening. 07/02/15  Yes Historical Provider, MD  montelukast (SINGULAIR) 10 MG tablet Take 10 mg by mouth every evening. 07/02/15  Yes Historical Provider, MD  Multiple Vitamin (MULTIVITAMIN WITH MINERALS) TABS tablet Take 1 tablet by mouth daily.   Yes Historical Provider, MD  pantoprazole (PROTONIX) 40 MG tablet Take 1 tablet (40 mg total) by mouth daily. 02/26/15  Yes Hoyt Koch, MD  telmisartan-hydrochlorothiazide (MICARDIS HCT) 80-25 MG per tablet Take 1 tablet by mouth daily. 03/06/15  Yes Hoyt Koch, MD  HYDROcodone-acetaminophen (NORCO/VICODIN) 5-325 MG tablet Take 1-2 tablets by mouth  every 6 hours as needed for pain and/or cough. Patient not taking: Reported on 07/27/2015 07/15/15   Elmyra Ricks Pisciotta, PA-C     Positive ROS: All other systems have been reviewed and were otherwise negative with the exception of those mentioned in the HPI and as above.  Physical Exam: General: Alert, no acute distress Cardiovascular: No pedal edema Respiratory: No cyanosis, no use of accessory musculature GI: No organomegaly, abdomen is soft and non-tender Skin: No lesions in the area of chief complaint Neurologic: Sensation intact distally Psychiatric: Patient is  competent for consent with normal mood and affect Lymphatic: No axillary or cervical lymphadenopathy  MUSCULOSKELETAL:   RLE splint c/d/i. Splint removed - skin intact. Swelling improved; (+) wrinkling. 2+ DP. SILT DP, PT; subjective sensory change SP. Wiggles toes.  Assessment: right lateral malleolar fracture   Plan: Plan for Procedure(s): OPEN REDUCTION INTERNAL FIXATION (ORIF) RIGHT  ANKLE   The risks benefits and alternatives were discussed with the patient including but not limited to the risks of nonoperative treatment, versus surgical intervention including infection, bleeding, nerve injury,  blood clots, cardiopulmonary complications, morbidity, mortality, among others, and they were willing to proceed.   Fleta Borgeson, Horald Pollen, MD Cell [phone]   07/30/2015 5:11 PM

## 2015-07-30 NOTE — Brief Op Note (Signed)
07/30/2015  7:55 PM  PATIENT:  [name]  55 y.o. female  PRE-OPERATIVE DIAGNOSIS:  right lateral malleolar fracture   POST-OPERATIVE DIAGNOSIS:  right lateral malleolar fracture   PROCEDURE:  Procedure(s): OPEN REDUCTION INTERNAL FIXATION (ORIF) RIGHT  ANKLE  (Right)  SURGEON:  Surgeon(s) and Role:    * Rod Can, MD - Primary  PHYSICIAN ASSISTANT:   ASSISTANTS: none   ANESTHESIA:   general  EBL:     BLOOD ADMINISTERED:none  DRAINS: none   LOCAL MEDICATIONS USED:  NONE  SPECIMEN:  No Specimen  DISPOSITION OF SPECIMEN:  N/A  COUNTS:  YES  TOURNIQUET:  * Missing tourniquet times found for documented tourniquets in log:  [id number] *  DICTATION: .Other Dictation: Dictation Number [id number]  PLAN OF CARE: Admit for overnight observation  PATIENT DISPOSITION:  PACU - hemodynamically stable.   Delay start of Pharmacological VTE agent (>24hrs) due to surgical blood loss or risk of bleeding: no

## 2015-07-30 NOTE — Anesthesia Postprocedure Evaluation (Signed)
Anesthesia Post Note  Patient: [name]  Procedure(s) Performed: Procedure(s) (LRB): OPEN REDUCTION INTERNAL FIXATION (ORIF) RIGHT  ANKLE  (Right)  Patient location during evaluation: PACU Anesthesia Type: General Level of consciousness: awake and alert Pain management: pain level controlled Vital Signs Assessment: post-procedure vital signs reviewed and stable Respiratory status: spontaneous breathing, nonlabored ventilation, respiratory function stable and patient connected to nasal cannula oxygen Cardiovascular status: blood pressure returned to baseline and stable Postop Assessment: no signs of nausea or vomiting Anesthetic complications: no    Last Vitals:  Filed Vitals:   07/30/15 2112 07/30/15 2115  BP: 129/70   Pulse: 68 68  Temp: 36.4 C   Resp: 16 18    Last Pain:  Filed Vitals:   07/30/15 2135  PainSc: 2                  [name]

## 2015-07-30 NOTE — Progress Notes (Signed)
Soft cast was on Rt lower leg when patient arrived in short stay. Circulation good to toes

## 2015-07-31 ENCOUNTER — Encounter (HOSPITAL_COMMUNITY): Payer: Self-pay | Admitting: Orthopedic Surgery

## 2015-07-31 DIAGNOSIS — S8261XA Displaced fracture of lateral malleolus of right fibula, initial encounter for closed fracture: Secondary | ICD-10-CM | POA: Diagnosis not present

## 2015-07-31 LAB — BASIC METABOLIC PANEL
Anion gap: 10 (ref 5–15)
BUN: 13 mg/dL (ref 6–20)
CO2: 26 mmol/L (ref 22–32)
Calcium: 8.8 mg/dL — ABNORMAL LOW (ref 8.9–10.3)
Chloride: 105 mmol/L (ref 101–111)
Creatinine, Ser: 0.8 mg/dL (ref 0.44–1.00)
GFR calc Af Amer: >60 mL/min (ref >60)
GFR calc non Af Amer: >60 mL/min (ref >60)
Glucose, Bld: 127 mg/dL — ABNORMAL HIGH (ref 65–99)
Potassium: 3.7 mmol/L (ref 3.5–5.1)
Sodium: 141 mmol/L (ref 135–145)

## 2015-07-31 MED ORDER — ASPIRIN EC 325 MG PO TBEC: 325.0000 mg | DELAYED_RELEASE_TABLET | Freq: Two times a day (BID) | ORAL | Status: DC

## 2015-07-31 MED ORDER — HYDROCODONE-ACETAMINOPHEN 5-325 MG PO TABS: 1.0000 | ORAL_TABLET | ORAL | Status: DC | PRN

## 2015-07-31 MED ORDER — DOCUSATE SODIUM 100 MG PO CAPS: 100.0000 mg | ORAL_CAPSULE | Freq: Two times a day (BID) | ORAL | Status: DC

## 2015-07-31 MED ORDER — SENNA 8.6 MG PO TABS: 2.0000 | ORAL_TABLET | Freq: Every day | ORAL | Status: DC

## 2015-07-31 MED ORDER — ONDANSETRON HCL 4 MG PO TABS: 4.0000 mg | ORAL_TABLET | Freq: Four times a day (QID) | ORAL | Status: DC | PRN

## 2015-07-31 NOTE — Care Management Note (Signed)
Case Management Note  Patient Details  Name: [name] MRN: [id number] Date of Birth: [birth year]  Subjective/Objective:    S/p Open reduction and internal fixation, right lateral malleolus.                Action/Plan: Discharge planning, per therapy no HH services needed. Requesting standard walker.   Expected Discharge Date:  07/31/15               Expected Discharge Plan:  Home/Self Care  In-House Referral:  NA  Discharge planning Services  CM Consult  Post Acute Care Choice:  Durable Medical Equipment Choice offered to:  NA  DME Arranged:  [place] DME Agency:  [place] Arranged:  NA HH Agency:  NA  Status of Service:  Completed, signed off  Medicare Important Message Given:    Date Medicare IM Given:    Medicare IM give by:    Date Additional Medicare IM Given:    Additional Medicare Important Message give by:     If discussed at [place] of Stay Meetings, dates discussed:    Additional Comments:  Guadalupe Maple, RN 07/31/2015, 10:49 AM

## 2015-07-31 NOTE — Op Note (Signed)
[name], [name] NO.:  [id number]  MEDICAL RECORD NO.:  [id number]  LOCATION:  [id number]                         FACILITY:  [facility]  PHYSICIAN:  Rod Can, MD     DATE OF BIRTH:  October 05, 1960  DATE OF PROCEDURE:  07/30/2015 DATE OF DISCHARGE:                              OPERATIVE REPORT   PREOPERATIVE DIAGNOSIS:  Right bimalleolar equivalent ankle fracture.  POSTOPERATIVE DIAGNOSIS:  Right bimalleolar equivalent ankle fracture.  PROCEDURE PERFORMED:  Open reduction and internal fixation, right lateral malleolus.  IMPLANTS: 1. [employer] Al[employer] distal fibula anatomic locking plate 4 hole,     with locking screws x5. 2. Bicortical screws x3.  COMPLICATIONS:  None.  DISPOSITION:  Stable to PACU.  ANTIBIOTICS:  900 mg clindamycin.  INDICATIONS:  The patient is a 55 year old, morbidly obese female, who slipped and fell on the wet grass on Christmas Day, 2016.  She sustained a right ankle fracture that was diagnosed in the emergency department. She was placed into a splint, given information for followup.  I saw her in the office.  She had a bimalleolar equivalent ankle fracture and therefore we discussed the risks, benefits, and alternatives to surgical fixation.  DESCRIPTION OF PROCEDURE IN DETAIL:  I identified the patient in the holding area, using 2 identifiers.  Surgical site was marked by myself. She was taken to the operating room, placed supine on the operating room table.  General anesthesia was induced.  Time-out was called, verifying site and site of surgery.  The right lower extremity was prepped and draped in a normal sterile surgical fashion.  After placing a nonsterile tourniquet on the groin, I used gravity to exsanguinate the lower extremity.  I elevated the tourniquet to 340 mmHg.  I made an incision in line with the fibula distally, I carried the incision down the bone approximately.  Careful blunt dissection was performed to protect  the superficial peroneal nerve.  I used a periosteal elevator to expose the fibula.  The fracture was identified.  I cleaned the fracture site with a curette and rongeur.  I copiously irrigated the fracture.  I used a point-to-point clamp to pull traction on the distal fibula.  I reduced the fracture and then I clamped it in place.  I prepared to place a 3.5 mm lag screw.  I drilled the near cortex with a 3.5 drill bit, I inserted the top-hat, drilled the far cortex.  I measured, placed the real screw, and screw got excellent purchase.  I removed all the clamps. I selected the plate, held it in place with K-wires.  I placed a proximal and distal compression screw to bring the plate down to the bone.  I then placed 4 locking screws distally.  I placed one bicortical screw up proximally.  I then removed the K-wires.  I placed one locking screw proximally.  I switched out my compression screw distally for a locking screw.  I obtained AP, lateral, and mortise fluoroscopy views. The fracture was reduced anatomically.  There was no chondral penetration of my screws.  I performed a live external rotation stress test and there was no medial clear space widening  or talar shifting, therefore the syndesmosis was not injured.  I copiously irrigated the wounds with saline.  I closed the fascia with 2-0 interrupted Vicryl, I let the tourniquet down, ensured meticulous hemostasis.  I closed the deep dermal layer with 3-0 interrupted Vicryl, I closed the skin with staples.  I dressed the wound with Adaptic, 4x4s, ABD, sterile cast padding, and a posterior splint with AU.  Once the splint was hardened, the patient was extubated and taken to PACU in stable condition. Sponge, needle, and instrument counts were correct at the end of the case x2.  There were no known complications.  I discussed the operative events and findings with the patient's husband.  We are going admit her overnight for observation.   She will be nonweightbearing to the lower extremity.  We will put her on Lovenox in house, home on ASA for DVT prophylaxis.  She will work with physical therapy.  We will discharge her after she is cleared by therapy.  I will see her back in the office in 2 weeks.  All questions solicited and answered.          ______________________________ Rod Can, MD     BS/MEDQ  D:  07/30/2015  T:  07/31/2015  Job:  [id number]

## 2015-07-31 NOTE — Progress Notes (Signed)
   Subjective:  Patient reports pain as mild to moderate.  No c/o. Denies N/V/CP/SOB.  Objective:   VITALS:   Filed Vitals:   07/31/15 0322 07/31/15 0421 07/31/15 0553 07/31/15 0618  BP:    128/81  Pulse: 90  96 84  Temp:    99 F (37.2 C)  TempSrc:    Oral  Resp: 18  18 18   Height:      Weight:      SpO2: 100% 100% 100% 100%    ABD soft splint c/d/i BCR, SILT toes Wiggles toes  Lab Results  Component Value Date   WBC 7.1 07/30/2015   HGB 11.6* 07/30/2015   HCT 36.7 07/30/2015   MCV 86.2 07/30/2015   PLT 304 07/30/2015   BMET    Component Value Date/Time   NA 141 07/31/2015 0513   K 3.7 07/31/2015 0513   CL 105 07/31/2015 0513   CO2 26 07/31/2015 0513   GLUCOSE 127* 07/31/2015 0513   BUN 13 07/31/2015 0513   CREATININE 0.80 07/31/2015 0513   CALCIUM 8.8* 07/31/2015 0513   GFRNONAA >60 07/31/2015 0513   GFRAA >60 07/31/2015 0513     Assessment/Plan: 1 Day Post-Op   Active Problems:   Fracture of right ankle, lateral malleolus   NWB RLE Ice, elevation DVT ppx: lovenox in house, out on ASA PO pain control PT/OT Dispo: d/c home with HHPT   [name], [name] 07/31/2015, 8:29 AM   Rod Can, MD Cell [phone]

## 2015-07-31 NOTE — Discharge Summary (Signed)
Physician Discharge Summary  Patient ID: [name] MRN: [id number] DOB/AGE: March 24, 1961 55 y.o.  Admit date: 07/30/2015 Discharge date: 07/31/2015  Admission Diagnoses:  Fracture of right ankle, lateral malleolus  Discharge Diagnoses:  Principal Problem:   Fracture of right ankle, lateral malleolus   Past Medical History  Diagnosis Date  . Hypertension   . Bronchitis   . Complication of anesthesia     hard to wake up years ago  . Shortness of breath dyspnea     with exertion  . Environmental and seasonal allergies     dust mites, dust, cats,trees,  . GERD (gastroesophageal reflux disease)   . Headache   . Heart murmur     Surgeries: Procedure(s): OPEN REDUCTION INTERNAL FIXATION (ORIF) RIGHT  ANKLE  on 07/30/2015   Consultants (if any):    Discharged Condition: Improved  Hospital Course: [name] is an 55 y.o. female who was admitted 07/30/2015 with a diagnosis of Fracture of right ankle, lateral malleolus and went to the operating room on 07/30/2015 and underwent the above named procedures.    She was given perioperative antibiotics:      Anti-infectives    Start     Dose/Rate Route Frequency Ordered Stop   07/31/15 0000  clindamycin (CLEOCIN) IVPB 600 mg     600 mg 100 mL/hr over 30 Minutes Intravenous Every 6 hours 07/30/15 2115 07/31/15 1759   07/30/15 1134  clindamycin (CLEOCIN) IVPB 900 mg     900 mg 100 mL/hr over 30 Minutes Intravenous On call to O.R. 07/30/15 1134 07/30/15 1745    .  She was given sequential compression devices, early ambulation, and lovenox for DVT prophylaxis.  She benefited maximally from the hospital stay and there were no complications.    Recent vital signs:  Filed Vitals:   07/31/15 0553 07/31/15 0618  BP:  128/81  Pulse: 96 84  Temp:  99 F (37.2 C)  Resp: 18 18    Recent laboratory studies:  Lab Results  Component Value Date   HGB 11.6* 07/30/2015   HGB 13.0 01/19/2014   HGB 10.7* 10/09/2010   Lab Results   Component Value Date   WBC 7.1 07/30/2015   PLT 304 07/30/2015   No results found for: INR Lab Results  Component Value Date   NA 141 07/31/2015   K 3.7 07/31/2015   CL 105 07/31/2015   CO2 26 07/31/2015   BUN 13 07/31/2015   CREATININE 0.80 07/31/2015   GLUCOSE 127* 07/31/2015    Discharge Medications:     Medication List    STOP taking these medications        acetaminophen 325 MG tablet  Commonly known as:  TYLENOL      TAKE these medications        Albuterol Sulfate 108 (90 Base) MCG/ACT Aepb  Commonly known as:  PROAIR RESPICLICK  Inhale 2 puffs into the lungs every 4 (four) hours as needed (SOB).     aspirin EC 325 MG tablet  Take 1 tablet (325 mg total) by mouth 2 (two) times daily after a meal.     azelastine 0.1 % nasal spray  Commonly known as:  ASTELIN  USE 1 PUFFS INTO EACH NOSTRIL ONCE A DAY AS NEEDED FOR ALLERGIES.     docusate sodium 100 MG capsule  Commonly known as:  COLACE  Take 1 capsule (100 mg total) by mouth 2 (two) times daily.     fluticasone 50 MCG/ACT nasal spray  Commonly known as:  FLONASE  Place 2 sprays into both nostrils daily as needed for allergies.     HYDROcodone-acetaminophen 5-325 MG tablet  Commonly known as:  NORCO  Take 1-2 tablets by mouth every 4 (four) hours as needed for moderate pain.     levocetirizine 5 MG tablet  Commonly known as:  XYZAL  Take 5 mg by mouth every evening.     montelukast 10 MG tablet  Commonly known as:  SINGULAIR  Take 10 mg by mouth every evening.     multivitamin with minerals Tabs tablet  Take 1 tablet by mouth daily.     ondansetron 4 MG tablet  Commonly known as:  ZOFRAN  Take 1 tablet (4 mg total) by mouth every 6 (six) hours as needed for nausea.     pantoprazole 40 MG tablet  Commonly known as:  PROTONIX  Take 1 tablet (40 mg total) by mouth daily.     senna 8.6 MG Tabs tablet  Commonly known as:  SENOKOT  Take 2 tablets (17.2 mg total) by mouth at bedtime.      telmisartan-hydrochlorothiazide 80-25 MG tablet  Commonly known as:  MICARDIS HCT  Take 1 tablet by mouth daily.        Diagnostic Studies: Dg Ankle Complete Right  07/30/2015  CLINICAL DATA:  55 year old female with history of lateral malleolar fracture status post ORIF. EXAM: DG C-ARM 61-120 MIN-NO REPORT; RIGHT ANKLE - COMPLETE 3+ VIEW COMPARISON:  07/15/2015. FINDINGS: Four intraoperative fluoroscopic spot views of the right ankle document interval placement of a lateral plate and screw fixation device traversing the previously noted fracture of the lateral malleolus. Restoration of normal anatomic alignment has been achieved. There is also a single obliquely placed fixation screw traversing the fracture itself. IMPRESSION: 1. Intraoperative documentation of ORIF for lateral malleoli fracture with restoration of normal anatomical alignment, as above. Electronically Signed   By: Vinnie Langton M.D.   On: 07/30/2015 19:35   Dg Ankle Complete Right  07/15/2015  CLINICAL DATA:  Status post slip and fall today with a right ankle injury and pain. The patient heard a pop. Initial encounter. EXAM: RIGHT ANKLE - COMPLETE 3+ VIEW COMPARISON:  None. FINDINGS: The patient has a lateral malleolar fracture with mild lateral displacement the distal fragment. The medial clear space is widened compatible with deltoid ligament injury. On the lateral view, an osseous fragment projects anterior to the tibiotalar joint but no donor site is seen. This may be a chronic finding. IMPRESSION: Acute lateral malleolar fracture. Widening of the medial clear space is compatible ligament injury. Electronically Signed   By: Inge Rise M.D.   On: 07/15/2015 12:22   Dg Ankle Right Port  07/30/2015  CLINICAL DATA:  Postop ORIF right ankle. EXAM: PORTABLE RIGHT ANKLE - 2 VIEW COMPARISON:  Preop exams 07/15/2015 FINDINGS: Lateral plate and multi screw fixation traversing distal fibular fracture. Improved alignment of the ankle  mortise with resolution of previous medial clear space widening. An os trigonum is noted. Lateral skin staples in overlying splint in place. The previous osseous fragment anterior to the tibiotalar joint is not seen. IMPRESSION: Anatomic alignment post right ankle ORIF. Electronically Signed   By: Jeb Levering M.D.   On: 07/30/2015 21:00   Dg C-arm 61-120 Min-no Report  07/30/2015  CLINICAL DATA: surgery C-ARM 61-120 MINUTES Fluoroscopy was utilized by the requesting physician.  No radiographic interpretation.    Disposition: 01-Home or Self Care  Discharge Instructions  Call MD / Call 911    Complete by:  As directed   If you experience chest pain or shortness of breath, CALL 911 and be transported to the hospital emergency room.  If you develope a fever above 101 F, pus (white drainage) or increased drainage or redness at the wound, or calf pain, call your surgeon's office.     Constipation Prevention    Complete by:  As directed   Drink plenty of fluids.  Prune juice may be helpful.  You may use a stool softener, such as Colace (over the counter) 100 mg twice a day.  Use MiraLax (over the counter) for constipation as needed.     Diet - low sodium heart healthy    Complete by:  As directed      Discharge instructions    Complete by:  As directed   Non weight bearing right leg Keep splint clean and dry Ice, elevation     Driving restrictions    Complete by:  As directed   No driving for 6 weeks     Increase activity slowly as tolerated    Complete by:  As directed      Lifting restrictions    Complete by:  As directed   No lifting for 6 weeks           Follow-up Information    Follow up with Luree Palla, Horald Pollen, MD. Schedule an appointment as soon as possible for a visit in 2 weeks.   Specialty:  Orthopedic Surgery   Why:  For suture removal   Contact information:   [address] [address] [phone]        Signed: Elie Goody 07/31/2015, 8:35 AM

## 2015-07-31 NOTE — Evaluation (Signed)
Physical Therapy Evaluation Patient Details Name: [name] MRN: [id number] DOB: 1960-12-17 Today's Date: 07/31/2015   History of Present Illness  ORIF R ankle, 2 weeks post  injury.  Clinical Impression  Patient is ambulating well, prefers a standard walker, [facility] delivered RW. PT provided discarded stems for Angelia Mould to have if  She desires.m to DC today.    Follow Up Recommendations No PT follow up    Equipment Recommendations  Standard walker    Recommendations for Other Services       Precautions / Restrictions Precautions Precautions: Fall Restrictions Weight Bearing Restrictions: Yes RLE Weight Bearing: Non weight bearing      Mobility  Bed Mobility Overal bed mobility: Modified Independent                Transfers Overall transfer level: Needs assistance Equipment used: Rolling walker (2 wheeled) Transfers: Sit to/from Stand Sit to Stand: Supervision            Ambulation/Gait Ambulation/Gait assistance: Min guard Ambulation Distance (Feet): 20 Feet Assistive device: Rolling walker (2 wheeled);Standard walker Gait Pattern/deviations: Step-to pattern        Stairs Stairs: Yes       General stair comments: verbally reviewed how patient performed  PTA- patient reports that she sits onto porch and spouse helps her  stand to get into WC and rolls into house  Wheelchair Mobility    Modified Rankin (Stroke Patients Only)       Balance                                             Pertinent Vitals/Pain Pain Assessment: 0-10 Pain Score: 2  Pain Location: R ankle Pain Descriptors / Indicators: Discomfort Pain Intervention(s): Limited activity within patient's tolerance;Monitored during session;Premedicated before session    Home Living Family/patient expects to be discharged to:: Private residence Living Arrangements: Spouse/significant other Available Help at Discharge: Family Type of Home: House Home  Access: Stairs to enter Entrance Stairs-Rails: None Entrance Stairs-Number of Steps: 3   Home Equipment: Crutches;Wheelchair - manual      Prior Function Level of Independence: Needs assistance   Gait / Transfers Assistance Needed: uses crutches and WC           Hand Dominance        Extremity/Trunk Assessment   Upper Extremity Assessment: Overall WFL for tasks assessed           Lower Extremity Assessment: RLE deficits/detail RLE Deficits / Details: maintains NWB    Cervical / Trunk Assessment: Normal  Communication   Communication: No difficulties  Cognition Arousal/Alertness: Awake/alert Behavior During Therapy: WFL for tasks assessed/performed Overall Cognitive Status: Within Functional Limits for tasks assessed                      General Comments      Exercises        Assessment/Plan    PT Assessment Patent does not need any further PT services  PT Diagnosis     PT Problem List    PT Treatment Interventions     PT Goals (Current goals can be found in the Care Plan section) Acute Rehab PT Goals Patient Stated Goal: to go home PT Goal Formulation: All assessment and education complete, DC therapy    Frequency     Barriers to discharge  Co-evaluation               End of Session   Activity Tolerance: Patient tolerated treatment well Patient left: in chair;with call bell/phone within reach;with nursing/sitter in room Nurse Communication: Mobility status    Functional Assessment Tool Used: clinical judgement Functional Limitation: Mobility: Walking and moving around Mobility: Walking and Moving Around Current Status [id number]): At least 1 percent but less than 20 percent impaired, limited or restricted Mobility: Walking and Moving Around Goal Status [phone]): At least 1 percent but less than 20 percent impaired, limited or restricted Mobility: Walking and Moving Around Discharge Status [phone]): At least 1 percent but  less than 20 percent impaired, limited or restricted    Time: 1030-1042 PT Time Calculation (min) (ACUTE ONLY): 12 min   Charges:   PT Evaluation $PT Eval Low Complexity: 1 Procedure     PT G Codes:   PT G-Codes **NOT FOR INPATIENT CLASS** Functional Assessment Tool Used: clinical judgement Functional Limitation: Mobility: Walking and moving around Mobility: Walking and Moving Around Current Status [id number]): At least 1 percent but less than 20 percent impaired, limited or restricted Mobility: Walking and Moving Around Goal Status [phone]): At least 1 percent but less than 20 percent impaired, limited or restricted Mobility: Walking and Moving Around Discharge Status [phone]): At least 1 percent but less than 20 percent impaired, limited or restricted    Claretha Cooper 07/31/2015, 1:06 PM

## 2015-08-19 ENCOUNTER — Other Ambulatory Visit: Payer: Self-pay | Admitting: Internal Medicine

## 2015-10-13 ENCOUNTER — Other Ambulatory Visit: Payer: Self-pay | Admitting: Internal Medicine

## 2015-10-17 ENCOUNTER — Other Ambulatory Visit: Payer: Self-pay | Admitting: Internal Medicine

## 2015-11-12 ENCOUNTER — Ambulatory Visit (INDEPENDENT_AMBULATORY_CARE_PROVIDER_SITE_OTHER): Payer: BLUE CROSS/BLUE SHIELD | Admitting: Internal Medicine

## 2015-11-12 ENCOUNTER — Encounter: Payer: Self-pay | Admitting: Internal Medicine

## 2015-11-12 VITALS — BP 144/80 | HR 87 | Temp 98.3°F | Resp 12 | Ht 63.0 in | Wt 297.0 lb

## 2015-11-12 DIAGNOSIS — S8261XS Displaced fracture of lateral malleolus of right fibula, sequela: Secondary | ICD-10-CM

## 2015-11-12 DIAGNOSIS — I10 Essential (primary) hypertension: Secondary | ICD-10-CM

## 2015-11-12 DIAGNOSIS — S82891A Other fracture of right lower leg, initial encounter for closed fracture: Secondary | ICD-10-CM | POA: Diagnosis not present

## 2015-11-12 MED ORDER — PANTOPRAZOLE SODIUM 40 MG PO TBEC: 40.0000 mg | DELAYED_RELEASE_TABLET | Freq: Every day | ORAL | Status: DC

## 2015-11-12 NOTE — Progress Notes (Signed)
Pre visit review using our clinic review tool, if applicable. No additional management support is needed unless otherwise documented below in the visit note. 

## 2015-11-12 NOTE — Patient Instructions (Signed)
We will get a bone density to check for the strength of the bones, you will get a call to schedule.   We do not need blood work today.   Come back for a physical in about 3-6 months.   Try to eat more regular meals to help with the dizzy feeling.

## 2015-11-13 ENCOUNTER — Encounter: Payer: Self-pay | Admitting: Internal Medicine

## 2015-11-13 NOTE — Assessment & Plan Note (Signed)
Ordered bone density given the recent fracture.

## 2015-11-13 NOTE — Assessment & Plan Note (Signed)
On temisartan/hctz and at goal. No adjustment today and recent BMP from hospital without indication for change.

## 2015-11-13 NOTE — Progress Notes (Signed)
   Subjective:    Patient ID: [name], female    DOB: 15-Sep-1960, 55 y.o.   MRN: [id number]  HPI The patient is a 55 YO female coming in for follow up of her blood pressure. She has had ankle fracture since that time and surgical fixation. She is now out of the cast and boot. She has a spot on her ankle which is still healing.   Review of Systems  Constitutional: Negative for fever, appetite change and fatigue.  HENT: Negative for sinus pressure.   Eyes: Negative.   Respiratory: Negative for cough, chest tightness, shortness of breath and wheezing.   Cardiovascular: Negative for chest pain, palpitations and leg swelling.  Gastrointestinal: Negative for abdominal pain, diarrhea, constipation and abdominal distention.  Musculoskeletal: Positive for arthralgias. Negative for myalgias, back pain and gait problem.  Skin: Negative.   Neurological: Negative for dizziness, weakness, light-headedness and headaches.      Objective:   Physical Exam  Constitutional: She is oriented to person, place, and time. She appears well-developed and well-nourished. No distress.  Obese  HENT:  Head: Normocephalic and atraumatic.  Eyes: EOM are normal.  Neck: Normal range of motion.  Cardiovascular: Normal rate and regular rhythm.   Pulmonary/Chest: Effort normal. No respiratory distress. She has no wheezes. She has no rales.  Abdominal: Soft. Bowel sounds are normal. She exhibits no distension. There is no tenderness. There is no rebound.  Neurological: She is alert and oriented to person, place, and time. Coordination normal.  Skin: Skin is warm and dry.  Vitals reviewed.  Filed Vitals:   11/12/15 1002 11/12/15 1045  BP: 154/78 144/80  Pulse: 87   Temp: 98.3 F (36.8 C)   TempSrc: Oral   Resp: 12   Height: 5\' 3"  (1.6 m)   Weight: 297 lb (134.718 kg)   SpO2: 99%       Assessment & Plan:

## 2015-12-18 ENCOUNTER — Ambulatory Visit (INDEPENDENT_AMBULATORY_CARE_PROVIDER_SITE_OTHER)
Admission: RE | Admit: 2015-12-18 | Discharge: 2015-12-18 | Disposition: A | Discharge status: Home / Self Care | Payer: BLUE CROSS/BLUE SHIELD | Source: Ambulatory Visit

## 2015-12-18 DIAGNOSIS — S82891A Other fracture of right lower leg, initial encounter for closed fracture: Secondary | ICD-10-CM | POA: Diagnosis not present

## 2015-12-23 DIAGNOSIS — S82891A Other fracture of right lower leg, initial encounter for closed fracture: Secondary | ICD-10-CM | POA: Diagnosis not present

## 2016-04-09 ENCOUNTER — Other Ambulatory Visit: Payer: Self-pay | Admitting: Internal Medicine

## 2016-05-14 ENCOUNTER — Encounter: Payer: Self-pay | Admitting: Internal Medicine

## 2016-05-14 ENCOUNTER — Ambulatory Visit (INDEPENDENT_AMBULATORY_CARE_PROVIDER_SITE_OTHER): Payer: BLUE CROSS/BLUE SHIELD | Admitting: Internal Medicine

## 2016-05-14 ENCOUNTER — Other Ambulatory Visit (INDEPENDENT_AMBULATORY_CARE_PROVIDER_SITE_OTHER): Payer: BLUE CROSS/BLUE SHIELD

## 2016-05-14 VITALS — BP 132/92 | HR 89 | Temp 97.8°F | Resp 18 | Ht 63.0 in | Wt 298.0 lb

## 2016-05-14 DIAGNOSIS — Z0000 Encounter for general adult medical examination without abnormal findings: Secondary | ICD-10-CM | POA: Diagnosis not present

## 2016-05-14 DIAGNOSIS — I10 Essential (primary) hypertension: Secondary | ICD-10-CM

## 2016-05-14 DIAGNOSIS — S91001D Unspecified open wound, right ankle, subsequent encounter: Secondary | ICD-10-CM

## 2016-05-14 DIAGNOSIS — Z1159 Encounter for screening for other viral diseases: Secondary | ICD-10-CM

## 2016-05-14 LAB — COMPREHENSIVE METABOLIC PANEL
ALT: 8 U/L (ref 0–35)
AST: 14 U/L (ref 0–37)
Albumin: 3.8 g/dL (ref 3.5–5.2)
Alkaline Phosphatase: 135 U/L — ABNORMAL HIGH (ref 39–117)
BUN: 13 mg/dL (ref 6–23)
CO2: 30 mEq/L (ref 19–32)
Calcium: 9.4 mg/dL (ref 8.4–10.5)
Chloride: 102 mEq/L (ref 96–112)
Creatinine, Ser: 0.85 mg/dL (ref 0.40–1.20)
GFR: 88.92 mL/min (ref >60.00)
Glucose, Bld: 95 mg/dL (ref 70–99)
Potassium: 4 mEq/L (ref 3.5–5.1)
Sodium: 140 mEq/L (ref 135–145)
Total Bilirubin: 0.7 mg/dL (ref 0.2–1.2)
Total Protein: 8 g/dL (ref 6.0–8.3)

## 2016-05-14 LAB — LIPID PANEL
Cholesterol: 107 mg/dL (ref 0–200)
HDL: 35.5 mg/dL — ABNORMAL LOW (ref >39.00)
LDL Cholesterol: 46 mg/dL (ref 0–99)
NonHDL: 71.57
Total CHOL/HDL Ratio: 3
Triglycerides: 126 mg/dL (ref 0.0–149.0)
VLDL: 25.2 mg/dL (ref 0.0–40.0)

## 2016-05-14 LAB — CBC
HCT: 36.4 % (ref 36.0–46.0)
Hemoglobin: 12.1 g/dL (ref 12.0–15.0)
MCHC: 33.1 g/dL (ref 30.0–36.0)
MCV: 79.2 fl (ref 78.0–100.0)
Platelets: 260 10*3/uL (ref 150.0–400.0)
RBC: 4.6 Mil/uL (ref 3.87–5.11)
RDW: 14.8 % (ref 11.5–15.5)
WBC: 7.7 10*3/uL (ref 4.0–10.5)

## 2016-05-14 LAB — HEMOGLOBIN A1C: Hgb A1c MFr Bld: 6 % (ref 4.6–6.5)

## 2016-05-14 NOTE — Progress Notes (Signed)
Pre visit review using our clinic review tool, if applicable. No additional management support is needed unless otherwise documented below in the visit note. 

## 2016-05-14 NOTE — Patient Instructions (Signed)
We are checking the labs today and will call you back with the results.   We will get you into the wound clinic to help the wound on the ankle heal the rest of the way.   Health Maintenance, Female Adopting a healthy lifestyle and getting preventive care can go a long way to promote health and wellness. Talk with your health care provider about what schedule of regular examinations is right for you. This is a good chance for you to check in with your provider about disease prevention and staying healthy. In between checkups, there are plenty of things you can do on your own. Experts have done a lot of research about which lifestyle changes and preventive measures are most likely to keep you healthy. Ask your health care provider for more information. WEIGHT AND DIET  Eat a healthy diet  Be sure to include plenty of vegetables, fruits, low-fat dairy products, and lean protein.  Do not eat a lot of foods high in solid fats, added sugars, or salt.  Get regular exercise. This is one of the most important things you can do for your health.  Most adults should exercise for at least 150 minutes each week. The exercise should increase your heart rate and make you sweat (moderate-intensity exercise).  Most adults should also do strengthening exercises at least twice a week. This is in addition to the moderate-intensity exercise.  Maintain a healthy weight  Body mass index (BMI) is a measurement that can be used to identify possible weight problems. It estimates body fat based on height and weight. Your health care provider can help determine your BMI and help you achieve or maintain a healthy weight.  For females 35 years of age and older:   A BMI below 18.5 is considered underweight.  A BMI of 18.5 to 24.9 is normal.  A BMI of 25 to 29.9 is considered overweight.  A BMI of 30 and above is considered obese.  Watch levels of cholesterol and blood lipids  You should start having your blood  tested for lipids and cholesterol at 56 years of age, then have this test every 5 years.  You may need to have your cholesterol levels checked more often if:  Your lipid or cholesterol levels are high.  You are older than 56 years of age.  You are at high risk for heart disease.  CANCER SCREENING   Lung Cancer  Lung cancer screening is recommended for adults 77-62 years old who are at high risk for lung cancer because of a history of smoking.  A yearly low-dose CT scan of the lungs is recommended for people who:  Currently smoke.  Have quit within the past 15 years.  Have at least a 30-pack-year history of smoking. A pack year is smoking an average of one pack of cigarettes a day for 1 year.  Yearly screening should continue until it has been 15 years since you quit.  Yearly screening should stop if you develop a health problem that would prevent you from having lung cancer treatment.  Breast Cancer  Practice breast self-awareness. This means understanding how your breasts normally appear and feel.  It also means doing regular breast self-exams. Let your health care provider know about any changes, no matter how small.  If you are in your 20s or 30s, you should have a clinical breast exam (CBE) by a health care provider every 1-3 years as part of a regular health exam.  If you are  68 or older, have a CBE every year. Also consider having a breast X-ray (mammogram) every year.  If you have a family history of breast cancer, talk to your health care provider about genetic screening.  If you are at high risk for breast cancer, talk to your health care provider about having an MRI and a mammogram every year.  Breast cancer gene (BRCA) assessment is recommended for women who have family members with BRCA-related cancers. BRCA-related cancers include:  Breast.  Ovarian.  Tubal.  Peritoneal cancers.  Results of the assessment will determine the need for genetic counseling  and BRCA1 and BRCA2 testing. Cervical Cancer Your health care provider may recommend that you be screened regularly for cancer of the pelvic organs (ovaries, uterus, and vagina). This screening involves a pelvic examination, including checking for microscopic changes to the surface of your cervix (Pap test). You may be encouraged to have this screening done every 3 years, beginning at age 3.  For women ages 34-65, health care providers may recommend pelvic exams and Pap testing every 3 years, or they may recommend the Pap and pelvic exam, combined with testing for human papilloma virus (HPV), every 5 years. Some types of HPV increase your risk of cervical cancer. Testing for HPV may also be done on women of any age with unclear Pap test results.  Other health care providers may not recommend any screening for nonpregnant women who are considered low risk for pelvic cancer and who do not have symptoms. Ask your health care provider if a screening pelvic exam is right for you.  If you have had past treatment for cervical cancer or a condition that could lead to cancer, you need Pap tests and screening for cancer for at least 20 years after your treatment. If Pap tests have been discontinued, your risk factors (such as having a new sexual partner) need to be reassessed to determine if screening should resume. Some women have medical problems that increase the chance of getting cervical cancer. In these cases, your health care provider may recommend more frequent screening and Pap tests. Colorectal Cancer  This type of cancer can be detected and often prevented.  Routine colorectal cancer screening usually begins at 56 years of age and continues through 56 years of age.  Your health care provider may recommend screening at an earlier age if you have risk factors for colon cancer.  Your health care provider may also recommend using home test kits to check for hidden blood in the stool.  A small camera  at the end of a tube can be used to examine your colon directly (sigmoidoscopy or colonoscopy). This is done to check for the earliest forms of colorectal cancer.  Routine screening usually begins at age 23.  Direct examination of the colon should be repeated every 5-10 years through 56 years of age. However, you may need to be screened more often if early forms of precancerous polyps or small growths are found. Skin Cancer  Check your skin from head to toe regularly.  Tell your health care provider about any new moles or changes in moles, especially if there is a change in a mole's shape or color.  Also tell your health care provider if you have a mole that is larger than the size of a pencil eraser.  Always use sunscreen. Apply sunscreen liberally and repeatedly throughout the day.  Protect yourself by wearing long sleeves, pants, a wide-brimmed hat, and sunglasses whenever you are outside. HEART  DISEASE, DIABETES, AND HIGH BLOOD PRESSURE   High blood pressure causes heart disease and increases the risk of stroke. High blood pressure is more likely to develop in:  People who have blood pressure in the high end of the normal range (130-139/85-89 mm Hg).  People who are overweight or obese.  People who are African American.  If you are 64-18 years of age, have your blood pressure checked every 3-5 years. If you are 41 years of age or older, have your blood pressure checked every year. You should have your blood pressure measured twice--once when you are at a hospital or clinic, and once when you are not at a hospital or clinic. Record the average of the two measurements. To check your blood pressure when you are not at a hospital or clinic, you can use:  An automated blood pressure machine at a pharmacy.  A home blood pressure monitor.  If you are between 61 years and 39 years old, ask your health care provider if you should take aspirin to prevent strokes.  Have regular diabetes  screenings. This involves taking a blood sample to check your fasting blood sugar level.  If you are at a normal weight and have a low risk for diabetes, have this test once every three years after 56 years of age.  If you are overweight and have a high risk for diabetes, consider being tested at a younger age or more often. PREVENTING INFECTION  Hepatitis B  If you have a higher risk for hepatitis B, you should be screened for this virus. You are considered at high risk for hepatitis B if:  You were born in a country where hepatitis B is common. Ask your health care provider which countries are considered high risk.  Your parents were born in a high-risk country, and you have not been immunized against hepatitis B (hepatitis B vaccine).  You have HIV or AIDS.  You use needles to inject street drugs.  You live with someone who has hepatitis B.  You have had sex with someone who has hepatitis B.  You get hemodialysis treatment.  You take certain medicines for conditions, including cancer, organ transplantation, and autoimmune conditions. Hepatitis C  Blood testing is recommended for:  Everyone born from [birth year] through 1966.  Anyone with known risk factors for hepatitis C. Sexually transmitted infections (STIs)  You should be screened for sexually transmitted infections (STIs) including gonorrhea and chlamydia if:  You are sexually active and are younger than 56 years of age.  You are older than 56 years of age and your health care provider tells you that you are at risk for this type of infection.  Your sexual activity has changed since you were last screened and you are at an increased risk for chlamydia or gonorrhea. Ask your health care provider if you are at risk.  If you do not have HIV, but are at risk, it may be recommended that you take a prescription medicine daily to prevent HIV infection. This is called pre-exposure prophylaxis (PrEP). You are considered at risk  if:  You are sexually active and do not regularly use condoms or know the HIV status of your partner(s).  You take drugs by injection.  You are sexually active with a partner who has HIV. Talk with your health care provider about whether you are at high risk of being infected with HIV. If you choose to begin PrEP, you should first be tested for HIV. You should  then be tested every 3 months for as long as you are taking PrEP.  PREGNANCY   If you are premenopausal and you may become pregnant, ask your health care provider about preconception counseling.  If you may become pregnant, take 400 to 800 micrograms (mcg) of folic acid every day.  If you want to prevent pregnancy, talk to your health care provider about birth control (contraception). OSTEOPOROSIS AND MENOPAUSE   Osteoporosis is a disease in which the bones lose minerals and strength with aging. This can result in serious bone fractures. Your risk for osteoporosis can be identified using a bone density scan.  If you are 63 years of age or older, or if you are at risk for osteoporosis and fractures, ask your health care provider if you should be screened.  Ask your health care provider whether you should take a calcium or vitamin D supplement to lower your risk for osteoporosis.  Menopause may have certain physical symptoms and risks.  Hormone replacement therapy may reduce some of these symptoms and risks. Talk to your health care provider about whether hormone replacement therapy is right for you.  HOME CARE INSTRUCTIONS   Schedule regular health, dental, and eye exams.  Stay current with your immunizations.   Do not use any tobacco products including cigarettes, chewing tobacco, or electronic cigarettes.  If you are pregnant, do not drink alcohol.  If you are breastfeeding, limit how much and how often you drink alcohol.  Limit alcohol intake to no more than 1 drink per day for nonpregnant women. One drink equals 12  ounces of beer, 5 ounces of wine, or 1 ounces of hard liquor.  Do not use street drugs.  Do not share needles.  Ask your health care provider for help if you need support or information about quitting drugs.  Tell your health care provider if you often feel depressed.  Tell your health care provider if you have ever been abused or do not feel safe at home.   This information is not intended to replace advice given to you by your health care provider. Make sure you discuss any questions you have with your health care provider.   Document Released: 01/20/2011 Document Revised: 07/28/2014 Document Reviewed: 06/08/2013 Elsevier Interactive Patient Education [employer].

## 2016-05-14 NOTE — Progress Notes (Signed)
   Subjective:    Patient ID: [name], female    DOB: September 25, 1960, 55 y.o.   MRN: [id number]  HPI The patient is a 55 YO female coming in for wellness. Still with wound on her right ankle post-op which has stopped healing.  PMH, [facility], social history reviewed and updated.   Review of Systems  Constitutional: Negative for activity change, appetite change, diaphoresis, fatigue and unexpected weight change.  HENT: Positive for congestion and postnasal drip. Negative for rhinorrhea, sneezing, sore throat and trouble swallowing.   Eyes: Negative.   Respiratory: Positive for shortness of breath. Negative for cough, chest tightness and wheezing.        With exertion, stable  Cardiovascular: Negative.   Gastrointestinal: Negative.   Musculoskeletal: Negative for arthralgias, gait problem, joint swelling and myalgias.  Skin: Positive for wound. Negative for color change and pallor.  Neurological: Negative.   Psychiatric/Behavioral: Negative.       Objective:   Physical Exam  Constitutional: She is oriented to person, place, and time. She appears well-developed and well-nourished.  Overweight  HENT:  Head: Normocephalic and atraumatic.  Eyes: EOM are normal.  Neck: Normal range of motion.  Cardiovascular: Normal rate and regular rhythm.   Carotids without bruit  Pulmonary/Chest: Effort normal and breath sounds normal. No respiratory distress. She has no wheezes. She has no rales.  Abdominal: Soft. Bowel sounds are normal. She exhibits no distension. There is no tenderness. There is no rebound.  Musculoskeletal: She exhibits no edema.  Neurological: She is alert and oriented to person, place, and time. Coordination normal.  Skin: Skin is warm and dry.  Wound 1-2 cm diameter right ankle with healed exterior (original size likely 4-5 cm diameter) with rolled edges and no scabbing over the wound, no purulent drainage or abscess appreciated. No red skin or swelling.   Psychiatric: She has a  normal mood and affect.   Vitals:   05/14/16 1001 05/14/16 1023  BP: (!) 142/94 (!) 132/92  Pulse: 89   Resp: 18   Temp: 97.8 F (36.6 C)   TempSrc: Oral   SpO2: 98%   Weight: 298 lb (135.2 kg)   Height: 5\' 3"  (1.6 m)       Assessment & Plan:

## 2016-05-15 DIAGNOSIS — S91001A Unspecified open wound, right ankle, initial encounter: Secondary | ICD-10-CM | POA: Insufficient documentation

## 2016-05-15 LAB — HEPATITIS C ANTIBODY: HCV Ab: NEGATIVE

## 2016-05-15 NOTE — Assessment & Plan Note (Signed)
With complication of essential hypertension. As of yet no diabetes. Checking HgA1c for screening.

## 2016-05-15 NOTE — Assessment & Plan Note (Signed)
Bp is slightly above goal without meds today. Most BP in range so will not adjust today. Taking telmisartan, htcz. Checking CMP and adjust as needed.

## 2016-05-15 NOTE — Assessment & Plan Note (Signed)
Referral to wound clinic for some likely debridement to continue the healing. No signs of infection at this time.

## 2016-05-15 NOTE — Assessment & Plan Note (Signed)
Colonoscopy due 2024, mammogram due but she declines at this time. Declines flu shot. Tdap is up to date. Checking hep c screening and labs. Counseled on the need to find some exercise she can do. Counseled on the dangers of distracted driving. Given screening recommendations.

## 2016-08-08 ENCOUNTER — Other Ambulatory Visit: Payer: Self-pay | Admitting: Internal Medicine

## 2016-08-08 DIAGNOSIS — Z1231 Encounter for screening mammogram for malignant neoplasm of breast: Secondary | ICD-10-CM

## 2016-09-03 ENCOUNTER — Ambulatory Visit
Admission: RE | Admit: 2016-09-03 | Discharge: 2016-09-03 | Disposition: A | Discharge status: Home / Self Care | Payer: BLUE CROSS/BLUE SHIELD | Source: Ambulatory Visit | Attending: Internal Medicine | Admitting: Internal Medicine

## 2016-09-03 DIAGNOSIS — Z1231 Encounter for screening mammogram for malignant neoplasm of breast: Secondary | ICD-10-CM

## 2016-10-03 ENCOUNTER — Other Ambulatory Visit: Payer: Self-pay | Admitting: Internal Medicine

## 2016-10-08 ENCOUNTER — Other Ambulatory Visit: Payer: Self-pay | Admitting: Internal Medicine

## 2016-10-22 ENCOUNTER — Other Ambulatory Visit: Payer: Self-pay | Admitting: Internal Medicine

## 2017-01-28 IMAGING — CR DG ANKLE COMPLETE 3+V*R*
3 series · 3 of 3 positions shown · non-contrast
Comparison: None.

CLINICAL DATA: Status post slip and fall today with a right ankle
injury and pain. The patient heard a pop. Initial encounter.

EXAM:
RIGHT ANKLE - COMPLETE 3+ VIEW

[x ankle ap right]
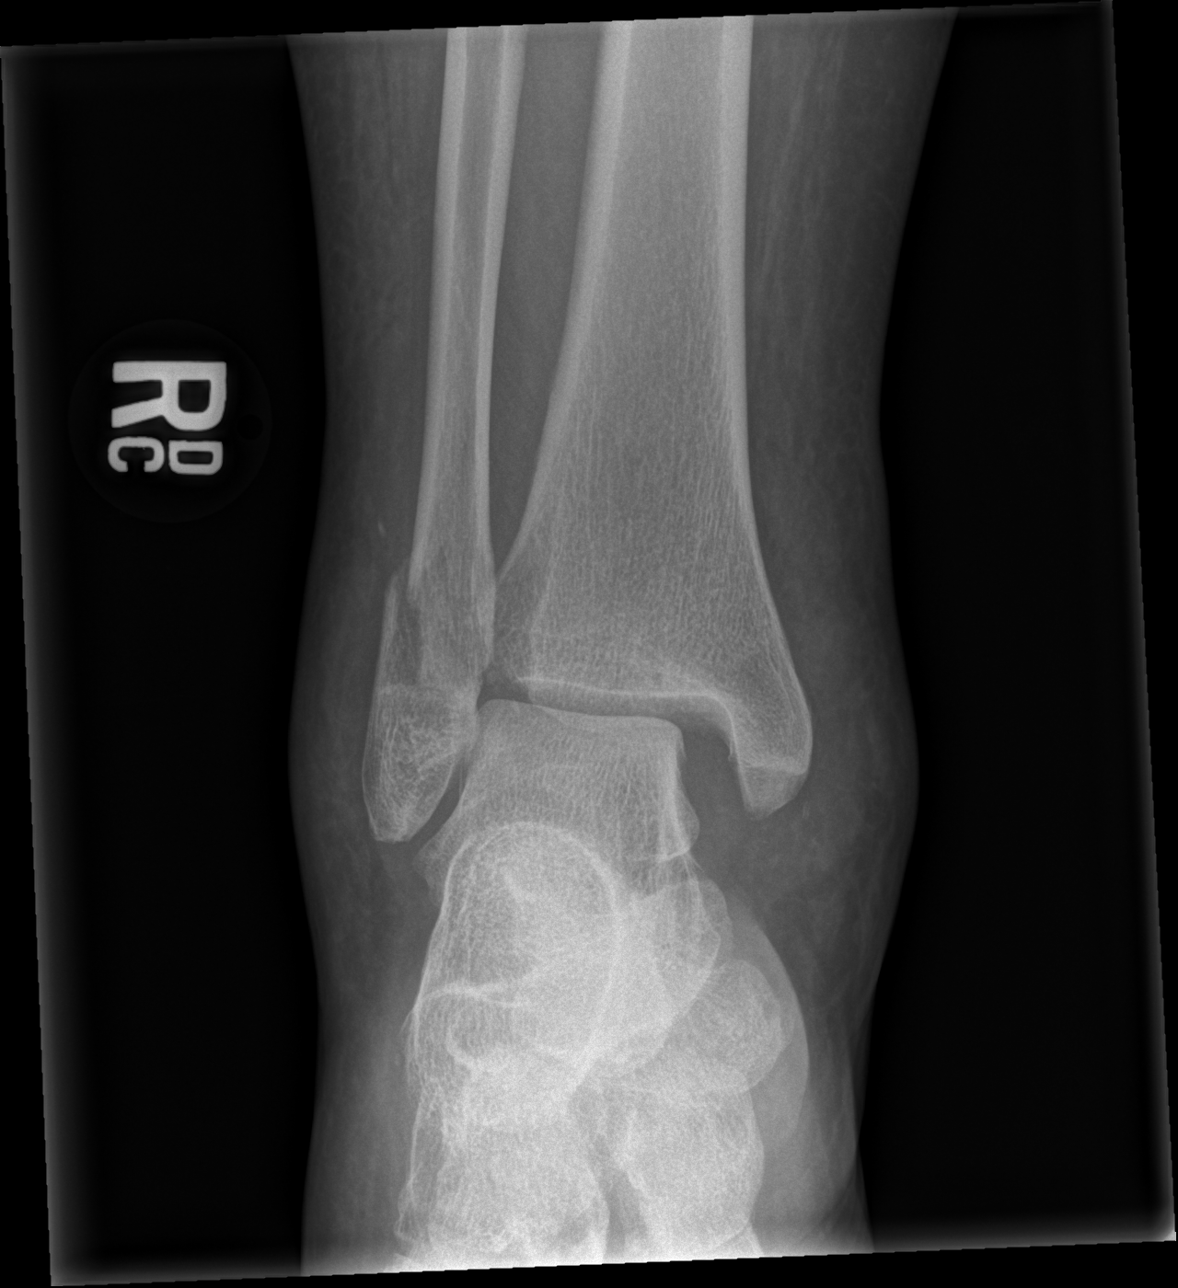

[x ankle obl right]
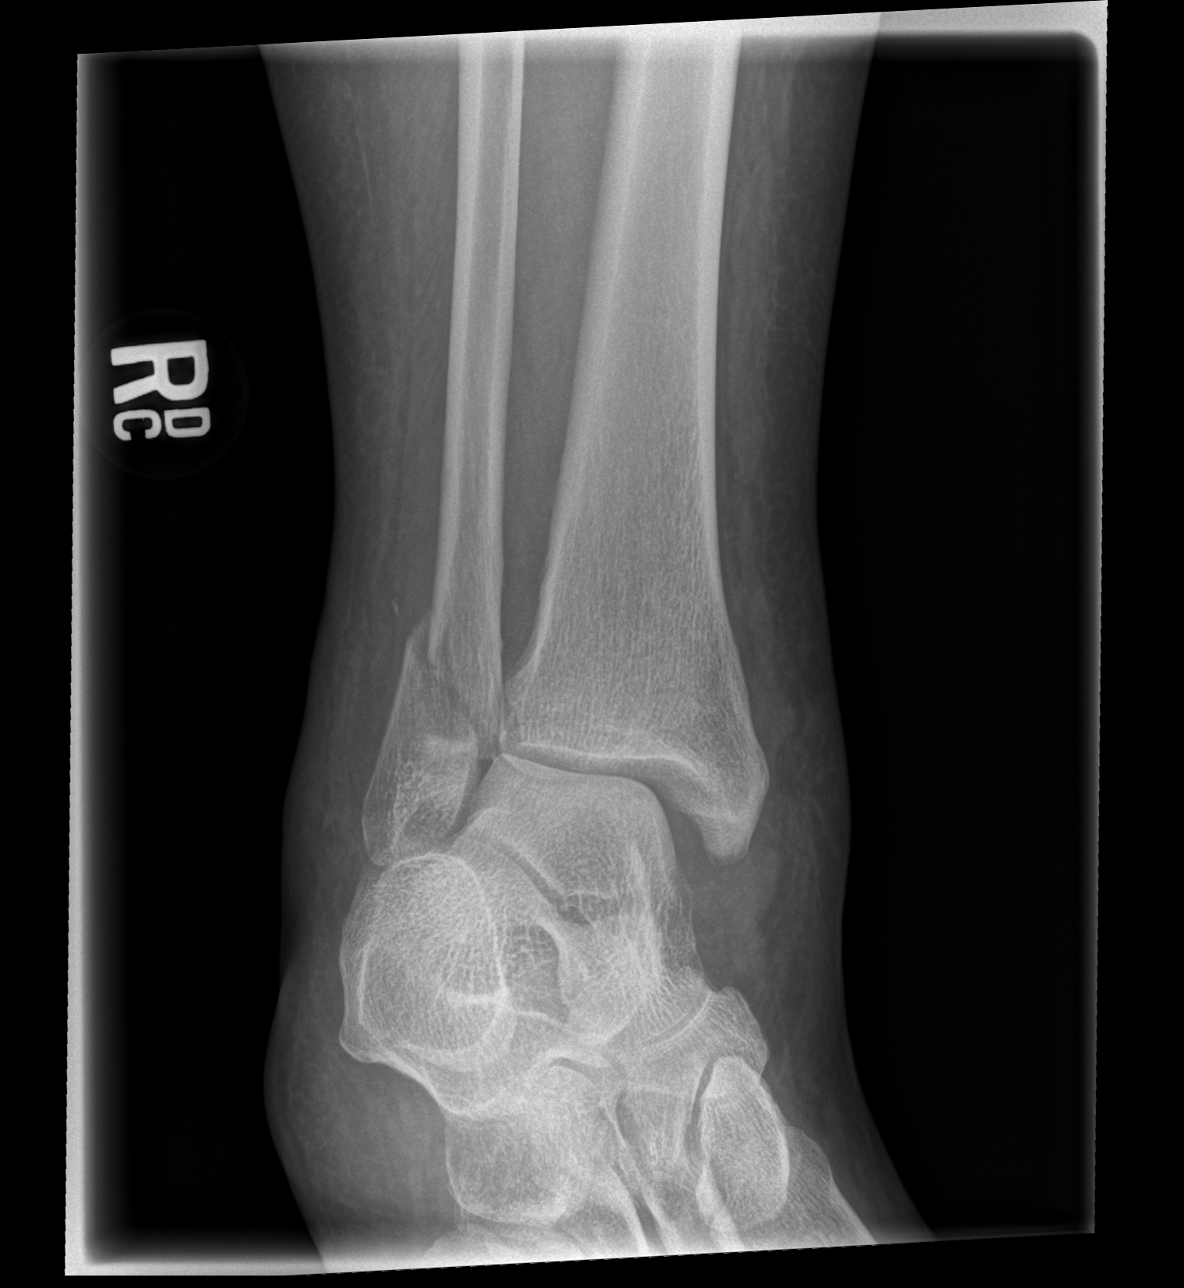

[x ankle lat right]
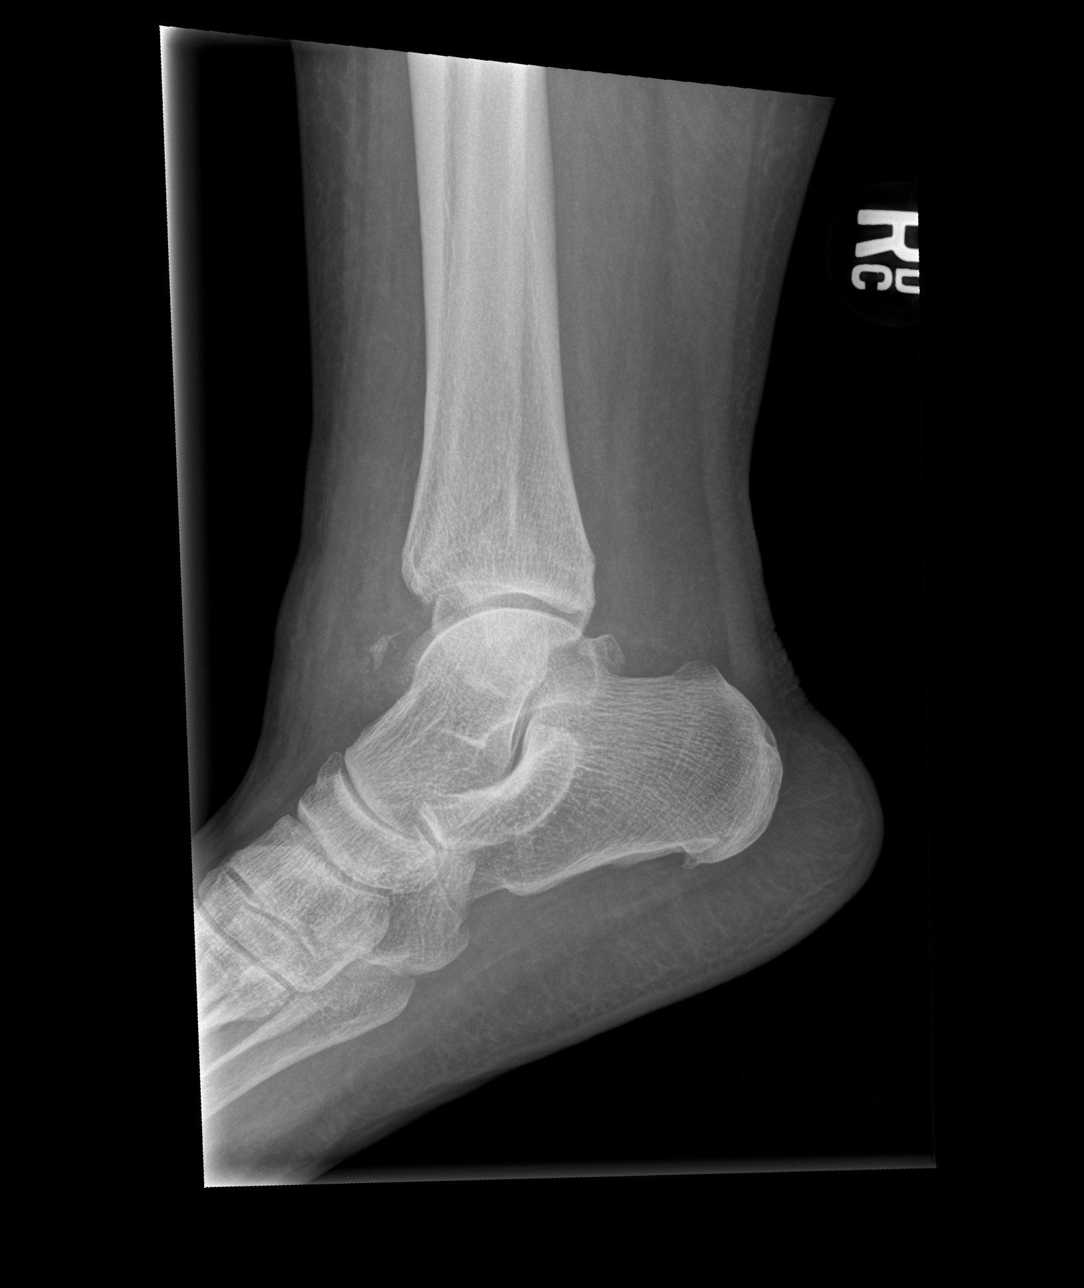

[3 of 3 positions shown; findings below may reference images not displayed]

FINDINGS: The patient has a lateral malleolar fracture with mild lateral
displacement the distal fragment. The medial clear space is widened
compatible with deltoid ligament injury. On the lateral view, an
osseous fragment projects anterior to the tibiotalar joint but no
donor site is seen. This may be a chronic finding.
IMPRESSION: Acute lateral malleolar fracture. Widening of the medial clear space
is compatible ligament injury.

## 2017-02-12 IMAGING — RF DG ANKLE COMPLETE 3+V*R*
1 series · 4 of 4 positions shown · non-contrast
Comparison: 07/15/2015.

CLINICAL DATA: 54-year-old female with history of lateral malleolar
fracture status post ORIF.

EXAM:
DG C-ARM 61-120 MIN-NO REPORT; RIGHT ANKLE - COMPLETE 3+ VIEW

[Series 1: run · 4 of 4 slices shown]
[im 1/4]
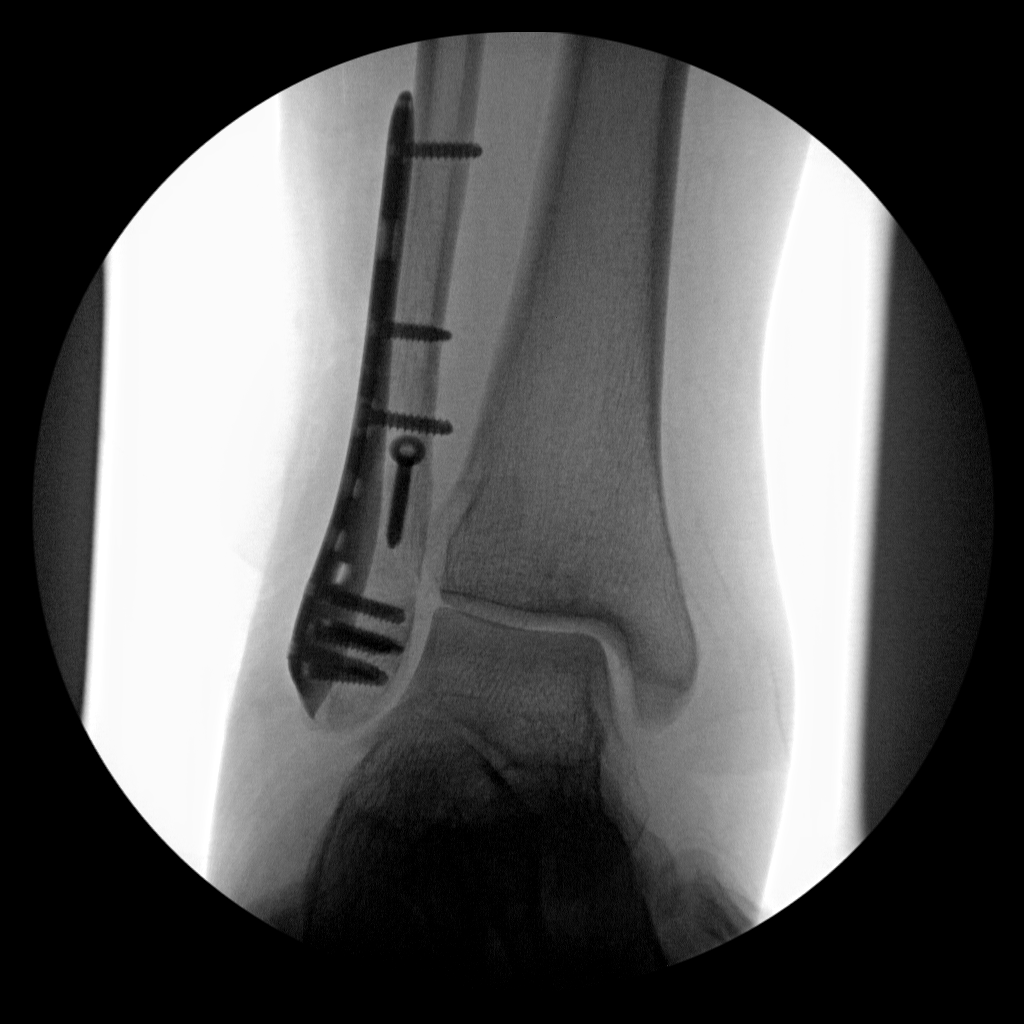
[im 2/4]
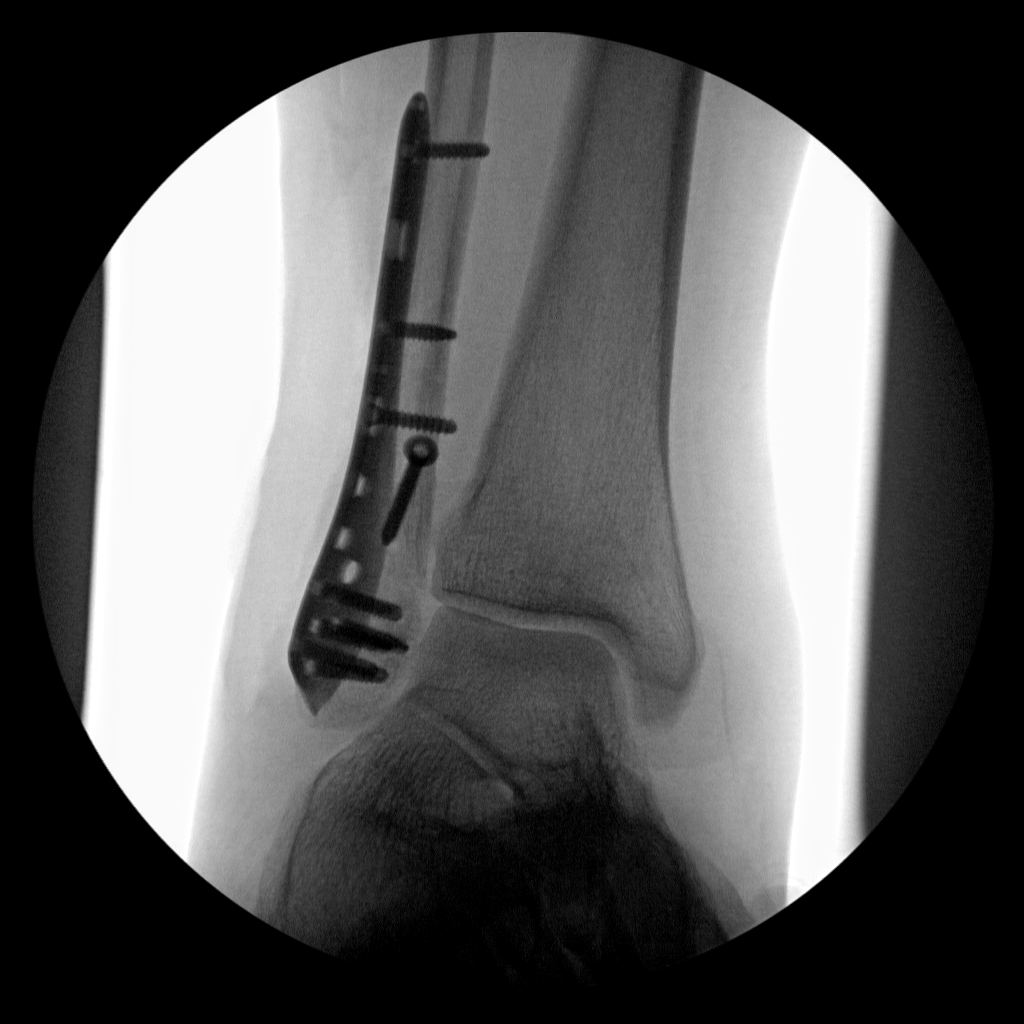
[im 3/4]
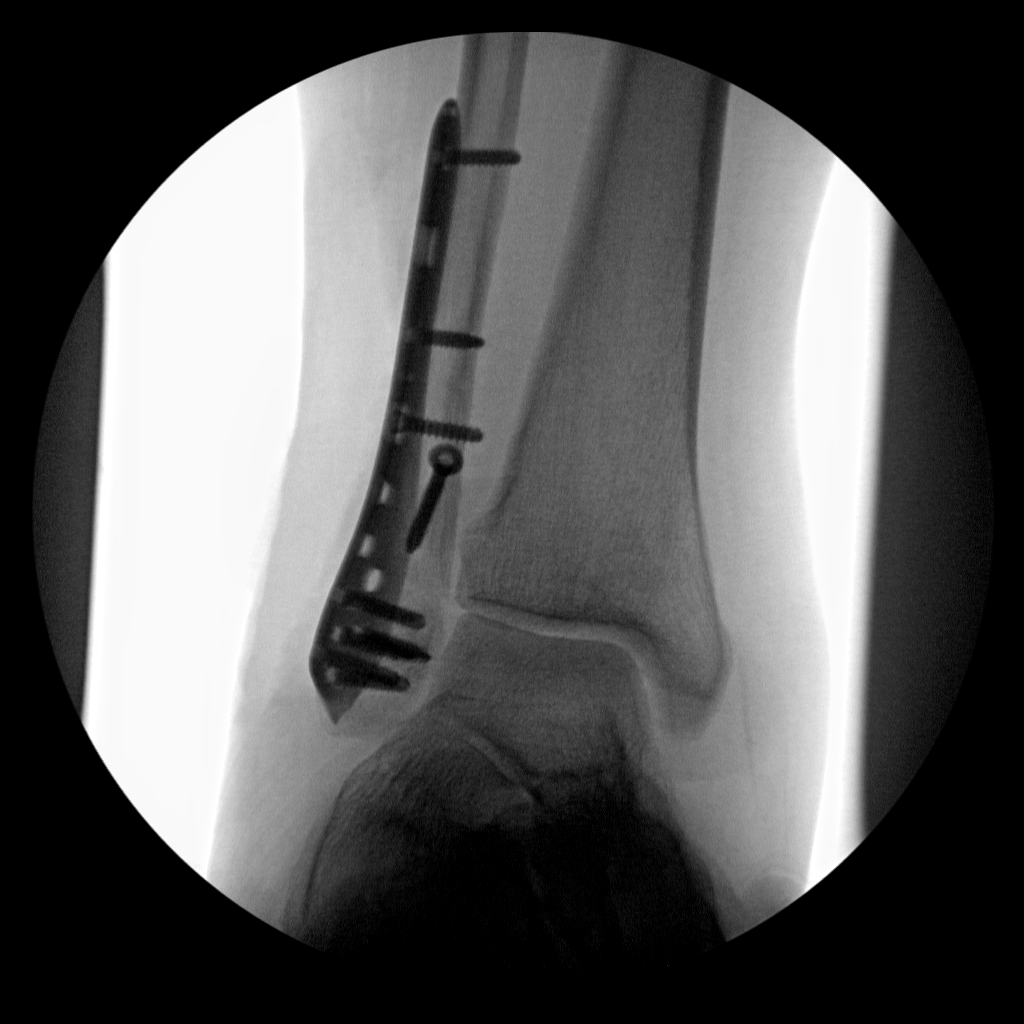
[im 4/4]
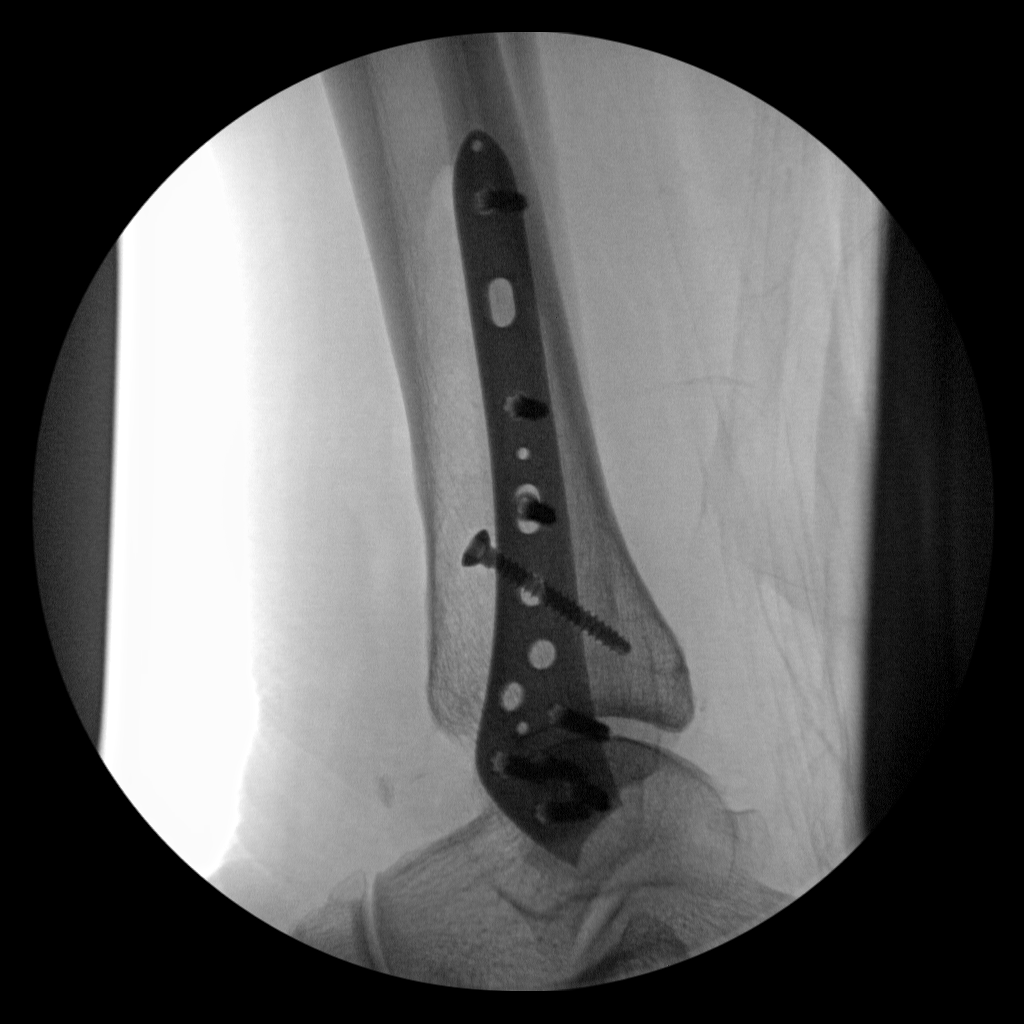

[4 of 4 positions shown; findings below may reference images not displayed]

FINDINGS: Four intraoperative fluoroscopic spot views of the right ankle
document interval placement of a lateral plate and screw fixation
device traversing the previously noted fracture of the lateral
malleolus. Restoration of normal anatomic alignment has been
achieved. There is also a single obliquely placed fixation screw
traversing the fracture itself.
IMPRESSION: 1. Intraoperative documentation of ORIF for lateral malleoli
fracture with restoration of normal anatomical alignment, as above.

## 2017-02-12 IMAGING — CR DG ANKLE PORT 2V*R*
1 series · 3 of 3 positions shown · non-contrast
Comparison: Preop exams 07/15/2015

CLINICAL DATA: Postop ORIF right ankle.

EXAM:
PORTABLE RIGHT ANKLE - 2 VIEW

[Series 1: AP · right · 3 of 3 slices shown]
[im 1/3]
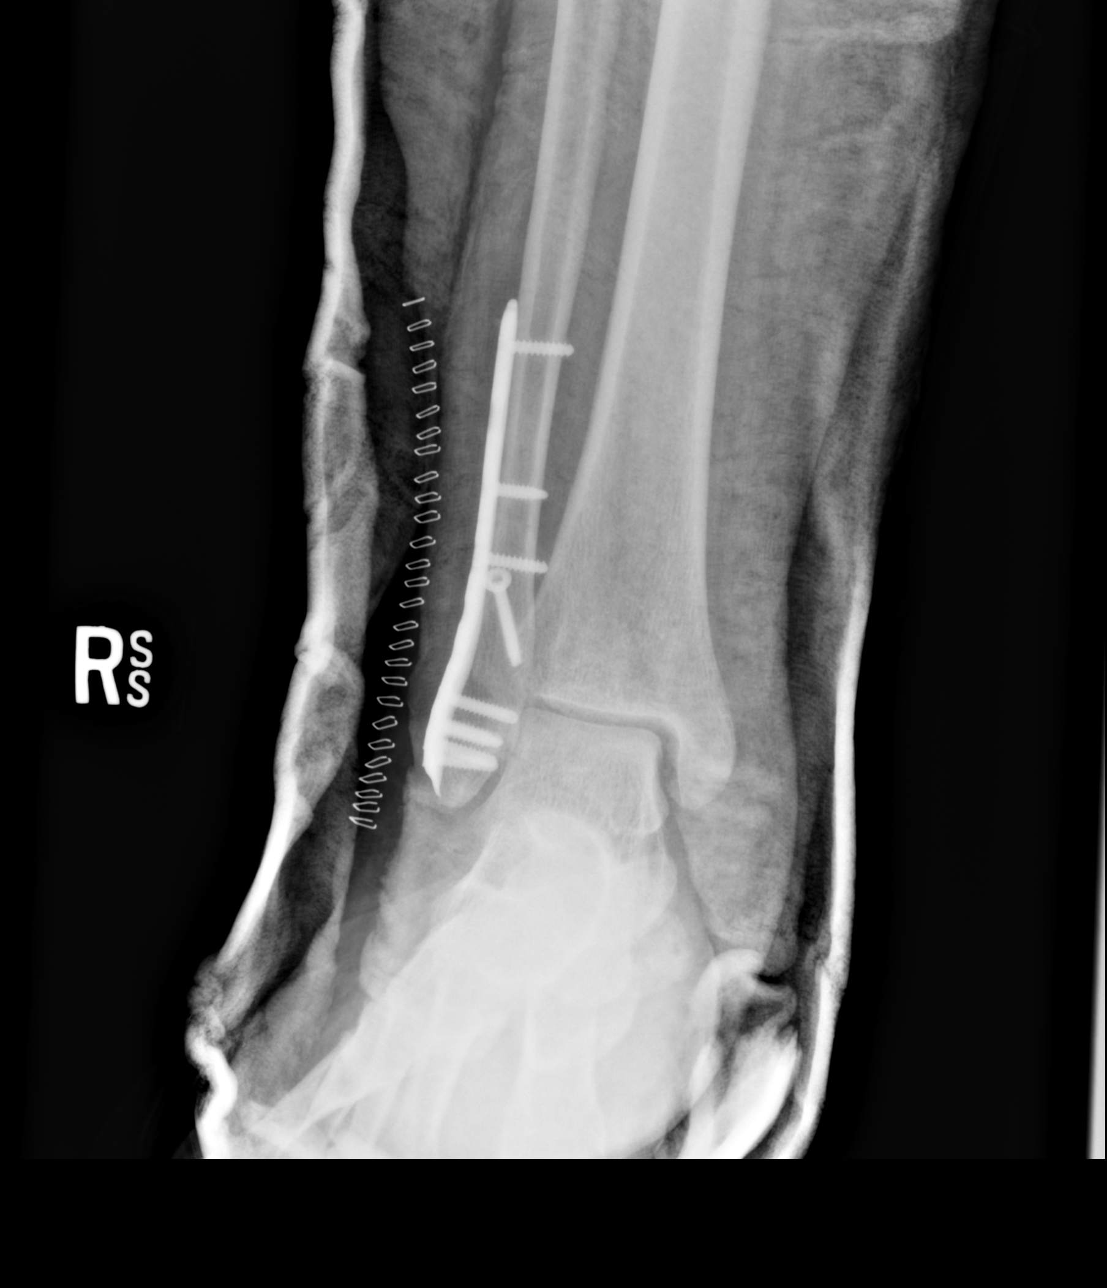
[im 2/3]
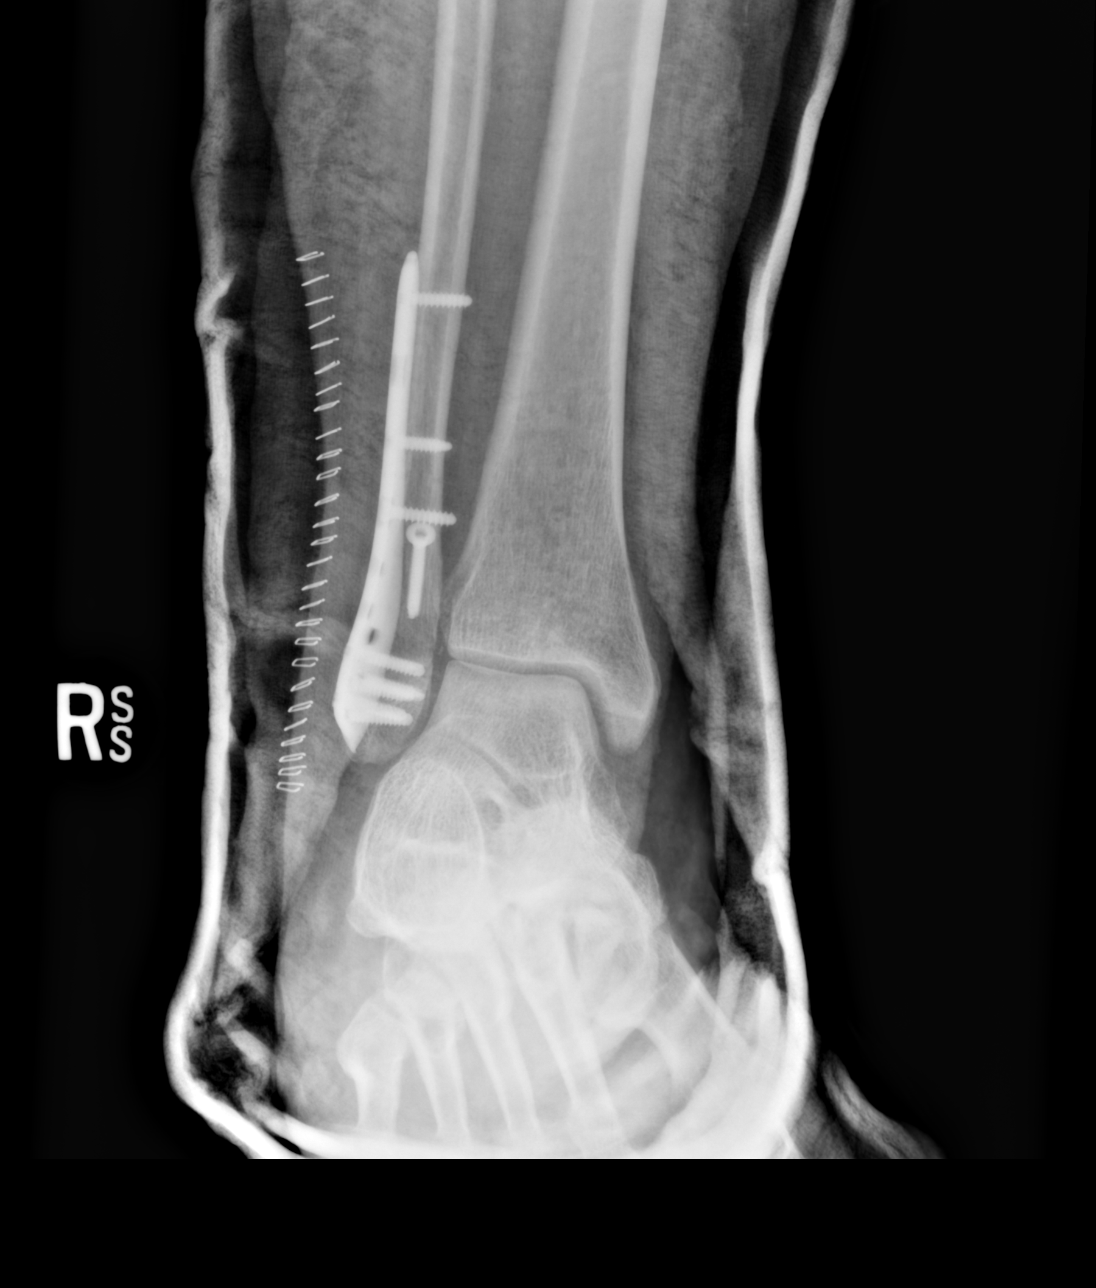
[im 3/3]
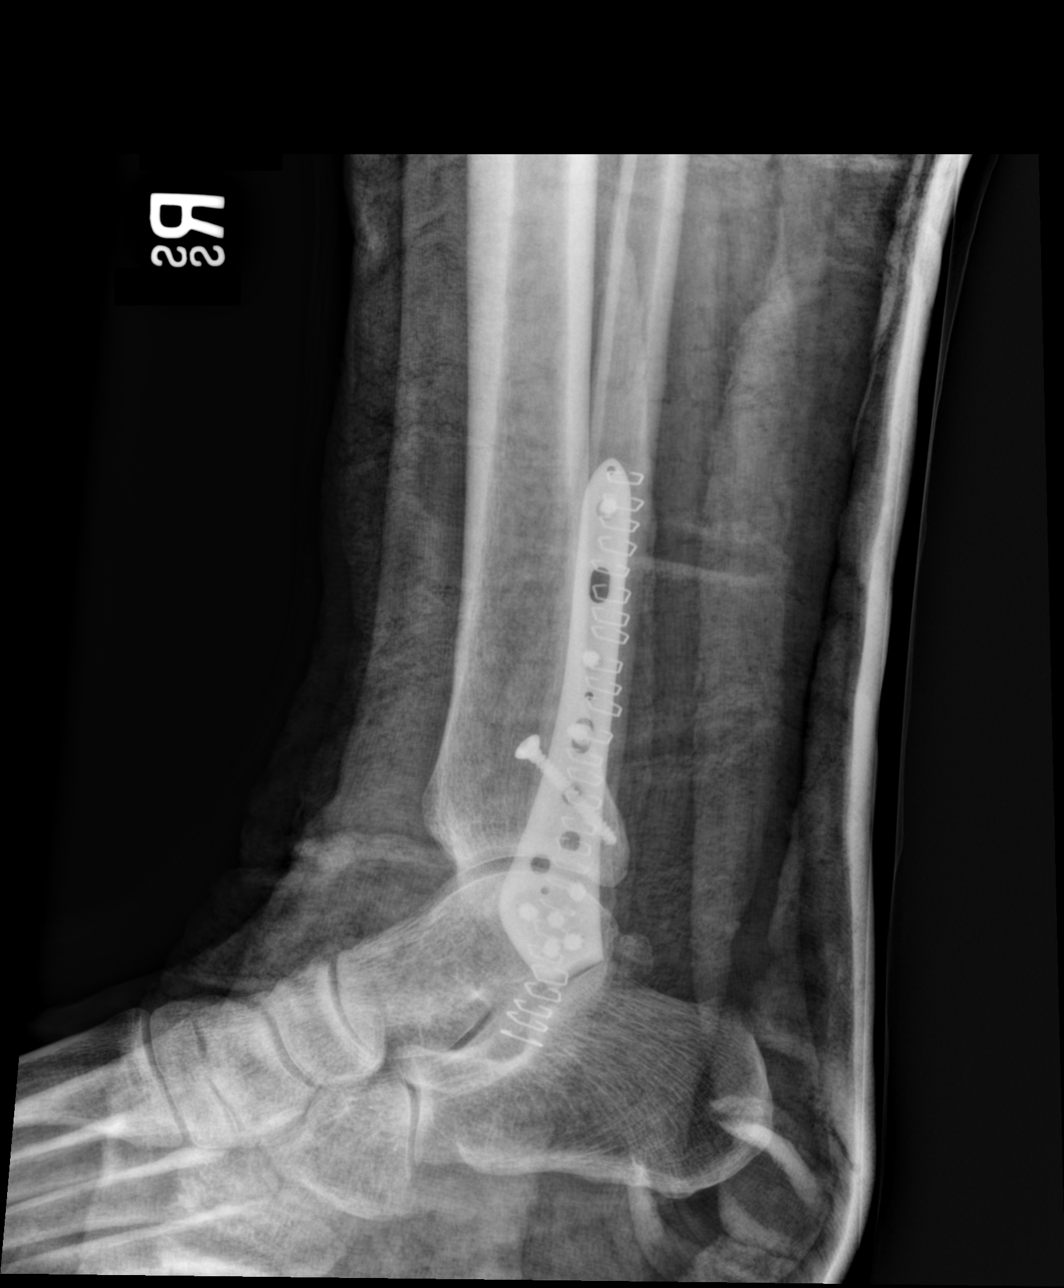

[3 of 3 positions shown; findings below may reference images not displayed]

FINDINGS: Lateral plate and multi screw fixation traversing distal fibular
fracture. Improved alignment of the ankle mortise with resolution of
previous medial clear space widening. An os trigonum is noted.
Lateral skin staples in overlying splint in place. The previous
osseous fragment anterior to the tibiotalar joint is not seen.
IMPRESSION: Anatomic alignment post right ankle ORIF.

## 2017-04-05 ENCOUNTER — Other Ambulatory Visit: Payer: Self-pay | Admitting: Internal Medicine

## 2017-07-05 ENCOUNTER — Other Ambulatory Visit: Payer: Self-pay | Admitting: Internal Medicine

## 2017-07-16 ENCOUNTER — Telehealth: Payer: Self-pay | Admitting: Internal Medicine

## 2017-07-16 ENCOUNTER — Other Ambulatory Visit: Payer: Self-pay | Admitting: *Deleted

## 2017-07-16 MED ORDER — TELMISARTAN-HCTZ 80-25 MG PO TABS: 1.0000 | ORAL_TABLET | Freq: Every day | ORAL | 0 refills | Status: DC

## 2017-07-16 NOTE — Telephone Encounter (Signed)
Copied from [place]. Topic: Quick Communication - Rx Refill/Question >> Jul 16, 2017  9:37 AM Scherrie Gerlach wrote: Pt has cpe on 07/27/2017.  But is out of her telmisartan-hydrochlorothiazide (MICARDIS HCT) 80-25 MG tablet  Can you send in 30 day to get her through to her appt on this date?  [id number] [address], [place] [phone] (Phone) [phone] (Fax)

## 2017-07-16 NOTE — Telephone Encounter (Signed)
Rx for 30 pills given. Patient to keep appointment.

## 2017-07-27 ENCOUNTER — Other Ambulatory Visit (INDEPENDENT_AMBULATORY_CARE_PROVIDER_SITE_OTHER): Payer: BLUE CROSS/BLUE SHIELD

## 2017-07-27 ENCOUNTER — Ambulatory Visit (INDEPENDENT_AMBULATORY_CARE_PROVIDER_SITE_OTHER): Payer: BLUE CROSS/BLUE SHIELD | Admitting: Internal Medicine

## 2017-07-27 ENCOUNTER — Encounter: Payer: Self-pay | Admitting: Internal Medicine

## 2017-07-27 VITALS — BP 138/88 | HR 74 | Temp 98.2°F | Ht 63.0 in | Wt 312.0 lb

## 2017-07-27 DIAGNOSIS — Z0000 Encounter for general adult medical examination without abnormal findings: Secondary | ICD-10-CM

## 2017-07-27 DIAGNOSIS — I10 Essential (primary) hypertension: Secondary | ICD-10-CM

## 2017-07-27 DIAGNOSIS — K219 Gastro-esophageal reflux disease without esophagitis: Secondary | ICD-10-CM | POA: Insufficient documentation

## 2017-07-27 LAB — COMPREHENSIVE METABOLIC PANEL
ALT: 7 U/L (ref 0–35)
AST: 10 U/L (ref 0–37)
Albumin: 3.9 g/dL (ref 3.5–5.2)
Alkaline Phosphatase: 120 U/L — ABNORMAL HIGH (ref 39–117)
BUN: 13 mg/dL (ref 6–23)
CO2: 30 mEq/L (ref 19–32)
Calcium: 9.1 mg/dL (ref 8.4–10.5)
Chloride: 101 mEq/L (ref 96–112)
Creatinine, Ser: 0.77 mg/dL (ref 0.40–1.20)
GFR: 99.23 mL/min (ref >60.00)
Glucose, Bld: 99 mg/dL (ref 70–99)
Potassium: 3.7 mEq/L (ref 3.5–5.1)
Sodium: 140 mEq/L (ref 135–145)
Total Bilirubin: 0.6 mg/dL (ref 0.2–1.2)
Total Protein: 7.8 g/dL (ref 6.0–8.3)

## 2017-07-27 LAB — CBC
HCT: 38.2 % (ref 36.0–46.0)
Hemoglobin: 12.3 g/dL (ref 12.0–15.0)
MCHC: 32.3 g/dL (ref 30.0–36.0)
MCV: 83.5 fl (ref 78.0–100.0)
Platelets: 251 10*3/uL (ref 150.0–400.0)
RBC: 4.57 Mil/uL (ref 3.87–5.11)
RDW: 14 % (ref 11.5–15.5)
WBC: 7.6 10*3/uL (ref 4.0–10.5)

## 2017-07-27 LAB — LIPID PANEL
Cholesterol: 97 mg/dL (ref 0–200)
HDL: 33.5 mg/dL — ABNORMAL LOW (ref >39.00)
LDL Cholesterol: 43 mg/dL (ref 0–99)
NonHDL: 63.2
Total CHOL/HDL Ratio: 3
Triglycerides: 100 mg/dL (ref 0.0–149.0)
VLDL: 20 mg/dL (ref 0.0–40.0)

## 2017-07-27 LAB — HEMOGLOBIN A1C: Hgb A1c MFr Bld: 6.1 % (ref 4.6–6.5)

## 2017-07-27 LAB — TSH: TSH: 1.65 u[IU]/mL (ref 0.35–4.50)

## 2017-07-27 NOTE — Assessment & Plan Note (Signed)
Weight is up some from last visit due to dietary changes with the holidays. She is now exercising 3 times per week to help.

## 2017-07-27 NOTE — Assessment & Plan Note (Signed)
BP at goal on telmisartan/hctz and checking CMP. Adjust as needed.

## 2017-07-27 NOTE — Progress Notes (Signed)
   Subjective:    Patient ID: [name], female    DOB: 1960/10/12, 57 y.o.   MRN: [id number]  HPI The patient is a 57 YO female coming in for physical. No new concerns.  PMH, [facility], social history reviewed and updated.   Review of Systems  Constitutional: Negative.   HENT: Negative.   Eyes: Negative.   Respiratory: Negative for cough, chest tightness and shortness of breath.   Cardiovascular: Negative for chest pain, palpitations and leg swelling.  Gastrointestinal: Negative for abdominal distention, abdominal pain, constipation, diarrhea, nausea and vomiting.  Musculoskeletal: Negative.   Skin: Negative.   Neurological: Negative.   Psychiatric/Behavioral: Negative.       Objective:   Physical Exam  Constitutional: She is oriented to person, place, and time. She appears well-developed and well-nourished.  Overweight  HENT:  Head: Normocephalic and atraumatic.  Eyes: EOM are normal.  Neck: Normal range of motion.  Cardiovascular: Normal rate and regular rhythm.  Pulmonary/Chest: Effort normal and breath sounds normal. No respiratory distress. She has no wheezes. She has no rales.  Abdominal: Soft. Bowel sounds are normal. She exhibits no distension. There is no tenderness. There is no rebound.  Musculoskeletal: She exhibits no edema.  Neurological: She is alert and oriented to person, place, and time. Coordination normal.  Skin: Skin is warm and dry.  Psychiatric: She has a normal mood and affect.   Vitals:   07/27/17 0903  BP: 138/88  Pulse: 74  Temp: 98.2 F (36.8 C)  TempSrc: Oral  SpO2: 99%  Weight: (!) 312 lb (141.5 kg)  Height: 5\' 3"  (1.6 m)      Assessment & Plan:

## 2017-07-27 NOTE — Patient Instructions (Signed)
Neck Exercises Neck exercises can be important for many reasons:  They can help you to improve and maintain flexibility in your neck. This can be especially important as you age.  They can help to make your neck stronger. This can make movement easier.  They can reduce or prevent neck pain.  They may help your upper back.  Ask your health care provider which neck exercises would be best for you. Exercises Neck Press Repeat this exercise 10 times. Do it first thing in the morning and right before bed or as told by your health care provider. 1. Lie on your back on a firm bed or on the floor with a pillow under your head. 2. Use your neck muscles to push your head down on the pillow and straighten your spine. 3. Hold the position as well as you can. Keep your head facing up and your chin tucked. 4. Slowly count to 5 while holding this position. 5. Relax for a few seconds. Then repeat.  Isometric Strengthening Do a full set of these exercises 2 times a day or as told by your health care provider. 1. Sit in a supportive chair and place your hand on your forehead. 2. Push forward with your head and neck while pushing back with your hand. Hold for 10 seconds. 3. Relax. Then repeat the exercise 3 times. 4. Next, do thesequence again, this time putting your hand against the back of your head. Use your head and neck to push backward against the hand pressure. 5. Finally, do the same exercise on either side of your head, pushing sideways against the pressure of your hand.  Prone Head Lifts Repeat this exercise 5 times. Do this 2 times a day or as told by your health care provider. 1. Lie face-down, resting on your elbows so that your chest and upper back are raised. 2. Start with your head facing downward, near your chest. Position your chin either on or near your chest. 3. Slowly lift your head upward. Lift until you are looking straight ahead. Then continue lifting your head as far back as  you can stretch. 4. Hold your head up for 5 seconds. Then slowly lower it to your starting position.  Supine Head Lifts Repeat this exercise 8-10 times. Do this 2 times a day or as told by your health care provider. 1. Lie on your back, bending your knees to point to the ceiling and keeping your feet flat on the floor. 2. Lift your head slowly off the floor, raising your chin toward your chest. 3. Hold for 5 seconds. 4. Relax and repeat.  Scapular Retraction Repeat this exercise 5 times. Do this 2 times a day or as told by your health care provider. 1. Stand with your arms at your sides. Look straight ahead. 2. Slowly pull both shoulders backward and downward until you feel a stretch between your shoulder blades in your upper back. 3. Hold for 10-30 seconds. 4. Relax and repeat.  Contact a health care provider if:  Your neck pain or discomfort gets much worse when you do an exercise.  Your neck pain or discomfort does not improve within 2 hours after you exercise. If you have any of these problems, stop exercising right away. Do not do the exercises again unless your health care provider says that you can. Get help right away if:  You develop sudden, severe neck pain. If this happens, stop exercising right away. Do not do the exercises again unless your   health care provider says that you can. Exercises Neck Stretch  Repeat this exercise 3-5 times. 1. Do this exercise while standing or while sitting in a chair. 2. Place your feet flat on the floor, shoulder-width apart. 3. Slowly turn your head to the right. Turn it all the way to the right so you can look over your right shoulder. Do not tilt or tip your head. 4. Hold this position for 10-30 seconds. 5. Slowly turn your head to the left, to look over your left shoulder. 6. Hold this position for 10-30 seconds.  Neck Retraction Repeat this exercise 8-10 times. Do this 3-4 times a day or as told by your health care  provider. 1. Do this exercise while standing or while sitting in a sturdy chair. 2. Look straight ahead. Do not bend your neck. 3. Use your fingers to push your chin backward. Do not bend your neck for this movement. Continue to face straight ahead. If you are doing the exercise properly, you will feel a slight sensation in your throat and a stretch at the back of your neck. 4. Hold the stretch for 1-2 seconds. Relax and repeat.  This information is not intended to replace advice given to you by your health care provider. Make sure you discuss any questions you have with your health care provider. Document Released: 06/18/2015 Document Revised: 12/13/2015 Document Reviewed: 01/15/2015 Elsevier Interactive Patient Education  2018 [place] Maintenance, Female Adopting a healthy lifestyle and getting preventive care can go a long way to promote health and wellness. Talk with your health care provider about what schedule of regular examinations is right for you. This is a good chance for you to check in with your provider about disease prevention and staying healthy. In between checkups, there are plenty of things you can do on your own. Experts have done a lot of research about which lifestyle changes and preventive measures are most likely to keep you healthy. Ask your health care provider for more information. Weight and diet Eat a healthy diet  Be sure to include plenty of vegetables, fruits, low-fat dairy products, and lean protein.  Do not eat a lot of foods high in solid fats, added sugars, or salt.  Get regular exercise. This is one of the most important things you can do for your health. ? Most adults should exercise for at least 150 minutes each week. The exercise should increase your heart rate and make you sweat (moderate-intensity exercise). ? Most adults should also do strengthening exercises at least twice a week. This is in addition to the moderate-intensity  exercise.  Maintain a healthy weight  Body mass index (BMI) is a measurement that can be used to identify possible weight problems. It estimates body fat based on height and weight. Your health care provider can help determine your BMI and help you achieve or maintain a healthy weight.  For females 35 years of age and older: ? A BMI below 18.5 is considered underweight. ? A BMI of 18.5 to 24.9 is normal. ? A BMI of 25 to 29.9 is considered overweight. ? A BMI of 30 and above is considered obese.  Watch levels of cholesterol and blood lipids  You should start having your blood tested for lipids and cholesterol at 58 years of age, then have this test every 5 years.  You may need to have your cholesterol levels checked more often if: ? Your lipid or cholesterol levels are high. ? You  are older than 58 years of age. ? You are at high risk for heart disease.  Cancer screening Lung Cancer  Lung cancer screening is recommended for adults 18-73 years old who are at high risk for lung cancer because of a history of smoking.  A yearly low-dose CT scan of the lungs is recommended for people who: ? Currently smoke. ? Have quit within the past 15 years. ? Have at least a 30-pack-year history of smoking. A pack year is smoking an average of one pack of cigarettes a day for 1 year.  Yearly screening should continue until it has been 15 years since you quit.  Yearly screening should stop if you develop a health problem that would prevent you from having lung cancer treatment.  Breast Cancer  Practice breast self-awareness. This means understanding how your breasts normally appear and feel.  It also means doing regular breast self-exams. Let your health care provider know about any changes, no matter how small.  If you are in your 20s or 30s, you should have a clinical breast exam (CBE) by a health care provider every 1-3 years as part of a regular health exam.  If you are 72 or older, have  a CBE every year. Also consider having a breast X-ray (mammogram) every year.  If you have a family history of breast cancer, talk to your health care provider about genetic screening.  If you are at high risk for breast cancer, talk to your health care provider about having an MRI and a mammogram every year.  Breast cancer gene (BRCA) assessment is recommended for women who have family members with BRCA-related cancers. BRCA-related cancers include: ? Breast. ? Ovarian. ? Tubal. ? Peritoneal cancers.  Results of the assessment will determine the need for genetic counseling and BRCA1 and BRCA2 testing.  Cervical Cancer Your health care provider may recommend that you be screened regularly for cancer of the pelvic organs (ovaries, uterus, and vagina). This screening involves a pelvic examination, including checking for microscopic changes to the surface of your cervix (Pap test). You may be encouraged to have this screening done every 3 years, beginning at age 80.  For women ages 43-65, health care providers may recommend pelvic exams and Pap testing every 3 years, or they may recommend the Pap and pelvic exam, combined with testing for human papilloma virus (HPV), every 5 years. Some types of HPV increase your risk of cervical cancer. Testing for HPV may also be done on women of any age with unclear Pap test results.  Other health care providers may not recommend any screening for nonpregnant women who are considered low risk for pelvic cancer and who do not have symptoms. Ask your health care provider if a screening pelvic exam is right for you.  If you have had past treatment for cervical cancer or a condition that could lead to cancer, you need Pap tests and screening for cancer for at least 20 years after your treatment. If Pap tests have been discontinued, your risk factors (such as having a new sexual partner) need to be reassessed to determine if screening should resume. Some women have  medical problems that increase the chance of getting cervical cancer. In these cases, your health care provider may recommend more frequent screening and Pap tests.  Colorectal Cancer  This type of cancer can be detected and often prevented.  Routine colorectal cancer screening usually begins at 58 years of age and continues through 58 years of age.  Your health care provider may recommend screening at an earlier age if you have risk factors for colon cancer.  Your health care provider may also recommend using home test kits to check for hidden blood in the stool.  A small camera at the end of a tube can be used to examine your colon directly (sigmoidoscopy or colonoscopy). This is done to check for the earliest forms of colorectal cancer.  Routine screening usually begins at age 48.  Direct examination of the colon should be repeated every 5-10 years through 58 years of age. However, you may need to be screened more often if early forms of precancerous polyps or small growths are found.  Skin Cancer  Check your skin from head to toe regularly.  Tell your health care provider about any new moles or changes in moles, especially if there is a change in a mole's shape or color.  Also tell your health care provider if you have a mole that is larger than the size of a pencil eraser.  Always use sunscreen. Apply sunscreen liberally and repeatedly throughout the day.  Protect yourself by wearing long sleeves, pants, a wide-brimmed hat, and sunglasses whenever you are outside.  Heart disease, diabetes, and high blood pressure  High blood pressure causes heart disease and increases the risk of stroke. High blood pressure is more likely to develop in: ? People who have blood pressure in the high end of the normal range (130-139/85-89 mm Hg). ? People who are overweight or obese. ? People who are African American.  If you are 31-22 years of age, have your blood pressure checked every 3-5  years. If you are 32 years of age or older, have your blood pressure checked every year. You should have your blood pressure measured twice-once when you are at a hospital or clinic, and once when you are not at a hospital or clinic. Record the average of the two measurements. To check your blood pressure when you are not at a hospital or clinic, you can use: ? An automated blood pressure machine at a pharmacy. ? A home blood pressure monitor.  If you are between 94 years and 56 years old, ask your health care provider if you should take aspirin to prevent strokes.  Have regular diabetes screenings. This involves taking a blood sample to check your fasting blood sugar level. ? If you are at a normal weight and have a low risk for diabetes, have this test once every three years after 58 years of age. ? If you are overweight and have a high risk for diabetes, consider being tested at a younger age or more often. Preventing infection Hepatitis B  If you have a higher risk for hepatitis B, you should be screened for this virus. You are considered at high risk for hepatitis B if: ? You were born in a country where hepatitis B is common. Ask your health care provider which countries are considered high risk. ? Your parents were born in a high-risk country, and you have not been immunized against hepatitis B (hepatitis B vaccine). ? You have HIV or AIDS. ? You use needles to inject street drugs. ? You live with someone who has hepatitis B. ? You have had sex with someone who has hepatitis B. ? You get hemodialysis treatment. ? You take certain medicines for conditions, including cancer, organ transplantation, and autoimmune conditions.  Hepatitis C  Blood testing is recommended for: ? Everyone born from [birth year] through 1966. ?  Anyone with known risk factors for hepatitis C.  Sexually transmitted infections (STIs)  You should be screened for sexually transmitted infections (STIs) including  gonorrhea and chlamydia if: ? You are sexually active and are younger than 58 years of age. ? You are older than 59 years of age and your health care provider tells you that you are at risk for this type of infection. ? Your sexual activity has changed since you were last screened and you are at an increased risk for chlamydia or gonorrhea. Ask your health care provider if you are at risk.  If you do not have HIV, but are at risk, it may be recommended that you take a prescription medicine daily to prevent HIV infection. This is called pre-exposure prophylaxis (PrEP). You are considered at risk if: ? You are sexually active and do not regularly use condoms or know the HIV status of your partner(s). ? You take drugs by injection. ? You are sexually active with a partner who has HIV.  Talk with your health care provider about whether you are at high risk of being infected with HIV. If you choose to begin PrEP, you should first be tested for HIV. You should then be tested every 3 months for as long as you are taking PrEP. Pregnancy  If you are premenopausal and you may become pregnant, ask your health care provider about preconception counseling.  If you may become pregnant, take 400 to 800 micrograms (mcg) of folic acid every day.  If you want to prevent pregnancy, talk to your health care provider about birth control (contraception). Osteoporosis and menopause  Osteoporosis is a disease in which the bones lose minerals and strength with aging. This can result in serious bone fractures. Your risk for osteoporosis can be identified using a bone density scan.  If you are 6 years of age or older, or if you are at risk for osteoporosis and fractures, ask your health care provider if you should be screened.  Ask your health care provider whether you should take a calcium or vitamin D supplement to lower your risk for osteoporosis.  Menopause may have certain physical symptoms and  risks.  Hormone replacement therapy may reduce some of these symptoms and risks. Talk to your health care provider about whether hormone replacement therapy is right for you. Follow these instructions at home:  Schedule regular health, dental, and eye exams.  Stay current with your immunizations.  Do not use any tobacco products including cigarettes, chewing tobacco, or electronic cigarettes.  If you are pregnant, do not drink alcohol.  If you are breastfeeding, limit how much and how often you drink alcohol.  Limit alcohol intake to no more than 1 drink per day for nonpregnant women. One drink equals 12 ounces of beer, 5 ounces of wine, or 1 ounces of hard liquor.  Do not use street drugs.  Do not share needles.  Ask your health care provider for help if you need support or information about quitting drugs.  Tell your health care provider if you often feel depressed.  Tell your health care provider if you have ever been abused or do not feel safe at home. This information is not intended to replace advice given to you by your health care provider. Make sure you discuss any questions you have with your health care provider. Document Released: 01/20/2011 Document Revised: 12/13/2015 Document Reviewed: 04/10/2015 Elsevier Interactive Patient Education  [employer].

## 2017-07-27 NOTE — Assessment & Plan Note (Signed)
Taking protonix daily, for QOL she would like to continue. Reminded about GERD diet changes.

## 2017-08-10 ENCOUNTER — Other Ambulatory Visit: Payer: Self-pay | Admitting: Internal Medicine

## 2017-09-17 ENCOUNTER — Ambulatory Visit (HOSPITAL_COMMUNITY)
Admission: EM | Admit: 2017-09-17 | Discharge: 2017-09-17 | Disposition: A | Discharge status: Home / Self Care | Payer: BLUE CROSS/BLUE SHIELD | Attending: Emergency Medicine | Admitting: Emergency Medicine

## 2017-09-17 ENCOUNTER — Encounter (HOSPITAL_COMMUNITY): Payer: Self-pay | Admitting: Family Medicine

## 2017-09-17 DIAGNOSIS — J0140 Acute pansinusitis, unspecified: Secondary | ICD-10-CM

## 2017-09-17 MED ORDER — CROMOLYN SODIUM 5.2 MG/ACT NA AERS: 1.0000 | INHALATION_SPRAY | Freq: Four times a day (QID) | NASAL | 12 refills | Status: AC

## 2017-09-17 MED ORDER — DEXAMETHASONE SODIUM PHOSPHATE 10 MG/ML IJ SOLN
10.0000 mg | Freq: Once | INTRAMUSCULAR | Status: AC
  Administered 2017-09-17: 10 mg via INTRAMUSCULAR

## 2017-09-17 MED ORDER — KETOROLAC TROMETHAMINE 60 MG/2ML IM SOLN
INTRAMUSCULAR | Status: AC
Start: 2017-09-17 — End: ?
  Filled 2017-09-17: qty 2

## 2017-09-17 MED ORDER — KETOROLAC TROMETHAMINE 60 MG/2ML IM SOLN
60.0000 mg | Freq: Once | INTRAMUSCULAR | Status: AC
  Administered 2017-09-17: 60 mg via INTRAMUSCULAR

## 2017-09-17 MED ORDER — DEXAMETHASONE SODIUM PHOSPHATE 10 MG/ML IJ SOLN
INTRAMUSCULAR | Status: AC
  Filled 2017-09-17: qty 1

## 2017-09-17 MED ORDER — DM-GUAIFENESIN ER 30-600 MG PO TB12: 2.0000 | ORAL_TABLET | Freq: Two times a day (BID) | ORAL | 0 refills | Status: DC

## 2017-09-17 NOTE — ED Triage Notes (Signed)
Pt here for sinus pressure and congestion since yesterday. Reports taking thera flu.

## 2017-09-17 NOTE — Discharge Instructions (Signed)
Push fluids to ensure adequate hydration and keep secretions thin.  Tylenol and/or ibuprofen as needed for pain or fevers.  Mucinex d as needed for congestion symptoms. Nasal spray up to 4 times a day as needed. Continue with prescribed daily flonase.  If symptoms worsen or do not improve in the next week to return to be seen or to follow up with PCP.

## 2017-09-17 NOTE — ED Provider Notes (Signed)
[facility]    CSN: [id number] Arrival date & time: 09/17/17  1001     History   Chief Complaint Chief Complaint  Patient presents with  . Nasal Congestion    HPI [name] is a 57 y.o. female.   Xianna presents with complaints of headache, facial pressure, congestion and non productive cough which started yesterday. Has a history of sinus infections and allergies. Takes daily singulair, has not been taking daily flonase. Right ear pain. Denies sore throat. Loose stools. Without nausae or vomiting. Has had her grandchildren around who have had URI symptoms. Taking dayquil and nyquil which have helped with cough. Last at 0700 this morning. Hx of gerd, allergies, bronchitis, htn, migraines. She states she is concerned the headache will lead to a migraine.rates her pain 5/10.     ROS per HPI.       Past Medical History:  Diagnosis Date  . Bronchitis   . Complication of anesthesia    hard to wake up years ago  . Environmental and seasonal allergies    dust mites, dust, cats,trees,  . GERD (gastroesophageal reflux disease)   . Headache   . Heart murmur   . Hypertension   . Shortness of breath dyspnea    with exertion    Patient Active Problem List   Diagnosis Date Noted  . GERD (gastroesophageal reflux disease) 07/27/2017  . Allergic rhinitis 05/11/2015  . Severe obesity (BMI >= 40) ([place]) 05/04/2014  . Routine general medical examination at a health care facility 05/04/2014  . Essential hypertension, benign 01/19/2014    Past Surgical History:  Procedure Laterality Date  . ABDOMINAL HYSTERECTOMY    . CYST REMOVAL HAND    . NASAL SINUS SURGERY    . ORIF ANKLE FRACTURE Right 07/30/2015   Procedure: OPEN REDUCTION INTERNAL FIXATION (ORIF) RIGHT  ANKLE ;  Surgeon: Rod Can, MD;  Location: [facility];  Service: Orthopedics;  Laterality: Right;    OB History    No data available       Home Medications    Prior to Admission medications     Medication Sig Start Date End Date Taking? Authorizing Provider  Albuterol Sulfate (PROAIR RESPICLICK) 403 (90 BASE) MCG/ACT AEPB Inhale 2 puffs into the lungs every 4 (four) hours as needed (SOB). 05/11/15   Hoyt Koch, MD  cromolyn (NASALCROM) 5.2 MG/ACT nasal spray Place 1 spray into both nostrils 4 (four) times daily. 09/17/17   Zigmund Gottron, NP  dextromethorphan-guaiFENesin (MUCINEX DM) 30-600 MG 12hr tablet Take 2 tablets by mouth 2 (two) times daily. 09/17/17   Zigmund Gottron, NP  fluticasone (FLONASE) 50 MCG/ACT nasal spray Place 2 sprays into both nostrils daily as needed for allergies.  12/13/13   [provider]  levocetirizine (XYZAL) 5 MG tablet Take 5 mg by mouth every evening. Reported on 11/12/2015 07/02/15   [provider]  montelukast (SINGULAIR) 10 MG tablet Take 10 mg by mouth every evening. 07/02/15   [provider]  pantoprazole (PROTONIX) 40 MG tablet TAKE 1 TABLET BY MOUTH EVERY DAY 10/22/16   Hoyt Koch, MD  telmisartan-hydrochlorothiazide (MICARDIS HCT) 80-25 MG tablet Take 1 tablet by mouth daily. 08/11/17   Hoyt Koch, MD    Family History History reviewed. No pertinent family history.  Social History Social History   Tobacco Use  . Smoking status: Former Smoker    Packs/day: 1.00    Years: 35.00    Pack years: 35.00  Types: Cigarettes    Last attempt to quit: 09/18/2013    Years since quitting: 4.0  . Smokeless tobacco: Never Used  Substance Use Topics  . Alcohol use: No  . Drug use: No     Allergies   Penicillins   Review of Systems Review of Systems   Physical Exam Triage Vital Signs ED Triage Vitals  Enc Vitals Group     BP 09/17/17 1025 (!) 154/68     Pulse Rate 09/17/17 1025 (!) 109     Resp 09/17/17 1025 18     Temp 09/17/17 1025 99.1 F (37.3 C)     Temp src --      SpO2 09/17/17 1025 100 %     Weight --      Height --      Head Circumference --      Peak Flow --       Pain Score 09/17/17 1024 5     Pain Loc --      Pain Edu? --      Excl. in [place]? --    No data found.  Updated Vital Signs BP (!) 154/68   Pulse (!) 109   Temp 99.1 F (37.3 C)   Resp 18   SpO2 100%   Visual Acuity Right Eye Distance:   Left Eye Distance:   Bilateral Distance:    Right Eye Near:   Left Eye Near:    Bilateral Near:     Physical Exam  Constitutional: She is oriented to person, place, and time. She appears well-developed and well-nourished. No distress.  HENT:  Head: Normocephalic and atraumatic.  Right Ear: Tympanic membrane, external ear and ear canal normal.  Left Ear: Tympanic membrane, external ear and ear canal normal.  Nose: Rhinorrhea present. Right sinus exhibits maxillary sinus tenderness and frontal sinus tenderness. Left sinus exhibits maxillary sinus tenderness and frontal sinus tenderness.  Mouth/Throat: Uvula is midline, oropharynx is clear and moist and mucous membranes are normal. No tonsillar exudate.  Eyes: Conjunctivae and EOM are normal. Pupils are equal, round, and reactive to light.  Cardiovascular: Regular rhythm and normal heart sounds. Tachycardia present.  Pulmonary/Chest: Effort normal and breath sounds normal.  Strong dry cough noted  Lymphadenopathy:    She has no cervical adenopathy.  Neurological: She is alert and oriented to person, place, and time.  Skin: Skin is warm and dry.     UC Treatments / Results  Labs (all labs ordered are listed, but only abnormal results are displayed) Labs Reviewed - No data to display  EKG  EKG Interpretation None       Radiology No results found.  Procedures Procedures (including critical care time)  Medications Ordered in UC Medications  ketorolac (TORADOL) injection 60 mg (not administered)  dexamethasone (DECADRON) injection 10 mg (not administered)     Initial Impression / Assessment and Plan / UC Course  I have reviewed the triage vital signs and the nursing  notes.  Pertinent labs & imaging results that were available during my care of the patient were reviewed by me and considered in my medical decision making (see chart for details).     Decadron and toradol provided for headache at this time. History and physical consistent with viral illness. Supportive cares recommended. Push fluids to ensure adequate hydration and keep secretions thin.  mucinex d, nasal sprays etc discussed. Return precautions provided. Patient verbalized understanding and agreeable to plan.    Final Clinical Impressions(s) / UC Diagnoses  Final diagnoses:  Acute pansinusitis, recurrence not specified    ED Discharge Orders        Ordered    cromolyn (NASALCROM) 5.2 MG/ACT nasal spray  4 times daily     09/17/17 1111    dextromethorphan-guaiFENesin (MUCINEX DM) 30-600 MG 12hr tablet  2 times daily     09/17/17 1111       Controlled Substance Prescriptions [place] Controlled Substance Registry consulted? Not Applicable   Zigmund Gottron, NP 09/17/17 1115

## 2017-10-19 ENCOUNTER — Ambulatory Visit (INDEPENDENT_AMBULATORY_CARE_PROVIDER_SITE_OTHER): Payer: BLUE CROSS/BLUE SHIELD

## 2017-10-19 DIAGNOSIS — Z299 Encounter for prophylactic measures, unspecified: Secondary | ICD-10-CM | POA: Diagnosis not present

## 2017-11-03 ENCOUNTER — Other Ambulatory Visit: Payer: Self-pay | Admitting: Internal Medicine

## 2018-01-28 ENCOUNTER — Other Ambulatory Visit: Payer: Self-pay | Admitting: Internal Medicine

## 2018-02-22 ENCOUNTER — Ambulatory Visit (INDEPENDENT_AMBULATORY_CARE_PROVIDER_SITE_OTHER): Payer: BLUE CROSS/BLUE SHIELD | Admitting: *Deleted

## 2018-02-22 DIAGNOSIS — Z23 Encounter for immunization: Secondary | ICD-10-CM | POA: Diagnosis not present

## 2018-04-05 ENCOUNTER — Ambulatory Visit (INDEPENDENT_AMBULATORY_CARE_PROVIDER_SITE_OTHER): Payer: BLUE CROSS/BLUE SHIELD | Admitting: Internal Medicine

## 2018-04-05 ENCOUNTER — Encounter: Payer: Self-pay | Admitting: Internal Medicine

## 2018-04-05 VITALS — BP 166/78 | HR 86 | Temp 98.4°F | Ht 63.0 in | Wt 309.0 lb

## 2018-04-05 DIAGNOSIS — I10 Essential (primary) hypertension: Secondary | ICD-10-CM

## 2018-04-05 DIAGNOSIS — R42 Dizziness and giddiness: Secondary | ICD-10-CM

## 2018-04-05 DIAGNOSIS — J0110 Acute frontal sinusitis, unspecified: Secondary | ICD-10-CM | POA: Diagnosis not present

## 2018-04-05 DIAGNOSIS — J329 Chronic sinusitis, unspecified: Secondary | ICD-10-CM | POA: Insufficient documentation

## 2018-04-05 MED ORDER — DOXYCYCLINE HYCLATE 100 MG PO TABS: 100.0000 mg | ORAL_TABLET | Freq: Two times a day (BID) | ORAL | 0 refills | Status: DC

## 2018-04-05 NOTE — Progress Notes (Signed)
   Subjective:    Patient ID: [name], female    DOB: Feb 03, 1961, 56 y.o.   MRN: [id number]  HPI The patient is a 56 YO female coming in for several concerns including high blood pressure (about 150-160/80s in the last several days, feeling mildly dizzy while high BP, taking her micardis/hctz daily as usual, denies headaches or chest pains, denies change in diet, weight is down 3 pounds since last visit) and sinus pressure (having ear pain, and facial tenderness, she usually gets bad fall allergies, denies fevers or chills, having symptoms bad for 3-4 weeks, taking xyzal and singulair and cromolyn, overall worsening) and dizziness (happened over the last several days, along with her other symptoms, denies falls, feels like room spinning, denies lightheadedness or syncope, has not tried anything for it as she did not know what to take).   Review of Systems  Constitutional: Positive for activity change. Negative for appetite change, chills, fatigue, fever and unexpected weight change.  HENT: Positive for congestion, ear pain, postnasal drip, rhinorrhea and sinus pressure. Negative for ear discharge, sinus pain, sneezing, sore throat, tinnitus, trouble swallowing and voice change.   Eyes: Negative.   Respiratory: Negative.  Negative for cough, chest tightness, shortness of breath and wheezing.   Cardiovascular: Negative.   Gastrointestinal: Negative.   Musculoskeletal: Negative for arthralgias, back pain, gait problem, joint swelling and myalgias.  Skin: Negative.   Neurological: Positive for dizziness. Negative for tremors, seizures, syncope, speech difficulty, weakness, light-headedness and headaches.      Objective:   Physical Exam  Constitutional: She is oriented to person, place, and time. She appears well-developed and well-nourished.  HENT:  Head: Normocephalic and atraumatic.  Oropharynx with redness and clear drainage, nose with swollen turbinates, TMs bulging bilaterally  Eyes:  EOM are normal.  Neck: Normal range of motion. No thyromegaly present.  Cardiovascular: Normal rate and regular rhythm.  Pulmonary/Chest: Effort normal and breath sounds normal. No respiratory distress. She has no wheezes. She has no rales.  Abdominal: Soft. Bowel sounds are normal. She exhibits no distension. There is no tenderness. There is no rebound.  Musculoskeletal: She exhibits no edema or tenderness.  Lymphadenopathy:    She has no cervical adenopathy.  Neurological: She is alert and oriented to person, place, and time. Coordination normal.  Skin: Skin is warm and dry.  Psychiatric: She has a normal mood and affect.   Vitals:   04/05/18 0928  BP: (!) 166/78  Pulse: 86  Temp: 98.4 F (36.9 C)  TempSrc: Oral  SpO2: 97%  Weight: (!) 309 lb (140.2 kg)  Height: 5\' 3"  (1.6 m)      Assessment & Plan:

## 2018-04-05 NOTE — Assessment & Plan Note (Signed)
Rx for doxycycline. There is bulging TMs in her ears and drainage and crusting with frontal sinus tenderness. She is taking xyzal, flonase and singulair consistently.

## 2018-04-05 NOTE — Assessment & Plan Note (Signed)
Suspect some vertigo due to sinus infection. Treating sinus infection and do not recommend further evaluation at this time. If persistent then may need re-evaluation.

## 2018-04-05 NOTE — Assessment & Plan Note (Signed)
Taking micardis HCTZ and will continue. Suspect that sinus infection is causing BP to be elevated. Will instruct her to monitor at home and we can adjust as needed.

## 2018-04-05 NOTE — Patient Instructions (Addendum)
We have sent in doxycycline to take 1 pill twice a day for 10 days for the sinuses.   Consider trying meditation or deep breathing exercises to help with anxiety.   Consider talking to a therapist or friend about anxiety or stress to get help with ideas to deal with this naturally.

## 2018-07-28 ENCOUNTER — Other Ambulatory Visit: Payer: Self-pay | Admitting: Internal Medicine

## 2018-08-02 ENCOUNTER — Encounter: Payer: Self-pay | Admitting: Internal Medicine

## 2018-08-02 ENCOUNTER — Other Ambulatory Visit (INDEPENDENT_AMBULATORY_CARE_PROVIDER_SITE_OTHER): Payer: BLUE CROSS/BLUE SHIELD

## 2018-08-02 ENCOUNTER — Ambulatory Visit (INDEPENDENT_AMBULATORY_CARE_PROVIDER_SITE_OTHER): Payer: BLUE CROSS/BLUE SHIELD | Admitting: Internal Medicine

## 2018-08-02 VITALS — BP 150/90 | HR 82 | Temp 98.2°F | Ht 63.0 in | Wt 309.0 lb

## 2018-08-02 DIAGNOSIS — J3089 Other allergic rhinitis: Secondary | ICD-10-CM

## 2018-08-02 DIAGNOSIS — Z0000 Encounter for general adult medical examination without abnormal findings: Secondary | ICD-10-CM | POA: Diagnosis not present

## 2018-08-02 DIAGNOSIS — K219 Gastro-esophageal reflux disease without esophagitis: Secondary | ICD-10-CM

## 2018-08-02 DIAGNOSIS — I10 Essential (primary) hypertension: Secondary | ICD-10-CM | POA: Diagnosis not present

## 2018-08-02 LAB — CBC
HCT: 36.4 % (ref 36.0–46.0)
Hemoglobin: 12 g/dL (ref 12.0–15.0)
MCHC: 32.9 g/dL (ref 30.0–36.0)
MCV: 81.5 fl (ref 78.0–100.0)
Platelets: 249 10*3/uL (ref 150.0–400.0)
RBC: 4.46 Mil/uL (ref 3.87–5.11)
RDW: 14.1 % (ref 11.5–15.5)
WBC: 7 10*3/uL (ref 4.0–10.5)

## 2018-08-02 LAB — COMPREHENSIVE METABOLIC PANEL
ALT: 9 U/L (ref 0–35)
AST: 11 U/L (ref 0–37)
Albumin: 4 g/dL (ref 3.5–5.2)
Alkaline Phosphatase: 114 U/L (ref 39–117)
BUN: 9 mg/dL (ref 6–23)
CO2: 29 mEq/L (ref 19–32)
Calcium: 9.3 mg/dL (ref 8.4–10.5)
Chloride: 101 mEq/L (ref 96–112)
Creatinine, Ser: 0.84 mg/dL (ref 0.40–1.20)
GFR: 89.43 mL/min (ref >60.00)
Glucose, Bld: 108 mg/dL — ABNORMAL HIGH (ref 70–99)
Potassium: 3.5 mEq/L (ref 3.5–5.1)
Sodium: 138 mEq/L (ref 135–145)
Total Bilirubin: 0.6 mg/dL (ref 0.2–1.2)
Total Protein: 7.9 g/dL (ref 6.0–8.3)

## 2018-08-02 LAB — LIPID PANEL
Cholesterol: 97 mg/dL (ref 0–200)
HDL: 32.7 mg/dL — ABNORMAL LOW (ref >39.00)
LDL Cholesterol: 41 mg/dL (ref 0–99)
NonHDL: 64.74
Total CHOL/HDL Ratio: 3
Triglycerides: 118 mg/dL (ref 0.0–149.0)
VLDL: 23.6 mg/dL (ref 0.0–40.0)

## 2018-08-02 LAB — HEMOGLOBIN A1C: Hgb A1c MFr Bld: 5.9 % (ref 4.6–6.5)

## 2018-08-02 MED ORDER — TRIAMCINOLONE ACETONIDE 0.1 % EX CREA: 1.0000 "application " | TOPICAL_CREAM | Freq: Two times a day (BID) | CUTANEOUS | 0 refills | Status: DC

## 2018-08-02 NOTE — Patient Instructions (Signed)

## 2018-08-02 NOTE — Progress Notes (Signed)
   Subjective:   Patient ID: [name], female    DOB: 08-Jun-1961, 58 y.o.   MRN: [id number]  HPI The patient is a 58 YO female coming in for physical.   PMH, [facility], social history reviewed and updated.   Review of Systems  Constitutional: Negative.   HENT: Negative.   Eyes: Negative.   Respiratory: Negative for cough, chest tightness and shortness of breath.   Cardiovascular: Negative for chest pain, palpitations and leg swelling.  Gastrointestinal: Negative for abdominal distention, abdominal pain, constipation, diarrhea, nausea and vomiting.  Musculoskeletal: Negative.   Skin: Negative.   Neurological: Negative.   Psychiatric/Behavioral: Negative.     Objective:  Physical Exam Constitutional:      Appearance: She is well-developed.  HENT:     Head: Normocephalic and atraumatic.  Neck:     Musculoskeletal: Normal range of motion.  Cardiovascular:     Rate and Rhythm: Normal rate and regular rhythm.  Pulmonary:     Effort: Pulmonary effort is normal. No respiratory distress.     Breath sounds: Normal breath sounds. No wheezing or rales.  Abdominal:     General: Bowel sounds are normal. There is no distension.     Palpations: Abdomen is soft.     Tenderness: There is no abdominal tenderness. There is no rebound.  Skin:    General: Skin is warm and dry.  Neurological:     Mental Status: She is alert and oriented to person, place, and time.     Coordination: Coordination normal.     Vitals:   08/02/18 1349  BP: (!) 150/90  Pulse: 82  Temp: 98.2 F (36.8 C)  TempSrc: Oral  SpO2: 97%  Weight: (!) 309 lb (140.2 kg)  Height: 5\' 3"  (1.6 m)    Assessment & Plan:

## 2018-08-03 ENCOUNTER — Other Ambulatory Visit: Payer: Self-pay | Admitting: Internal Medicine

## 2018-08-03 NOTE — Assessment & Plan Note (Signed)
Taking telmisartan/hctz and BP mildly above goal today. She states normal at home. Will monitor. Checking CMP and adjust as needed.

## 2018-08-03 NOTE — Assessment & Plan Note (Addendum)
Weight is stable but she has been unable to lose any weight. She is not motivated for change currently. Complicated by HTN, GERD.

## 2018-08-03 NOTE — Assessment & Plan Note (Signed)
Still struggling with sinuses and taking flonase, xyzal and cromolyn. No flare today.

## 2018-08-03 NOTE — Assessment & Plan Note (Signed)
Flu shot declined. Shingrix complete. Tetanus up to date. Colonoscopy up to date. Mammogram up to date, pap smear not indicated. Counseled about sun safety and mole surveillance. Counseled about the dangers of distracted driving. Given 10 year screening recommendations.

## 2018-08-03 NOTE — Assessment & Plan Note (Signed)
Taking protonix with good control. Will continue.

## 2018-08-27 ENCOUNTER — Ambulatory Visit: Payer: BLUE CROSS/BLUE SHIELD | Admitting: Internal Medicine

## 2018-08-27 ENCOUNTER — Encounter: Payer: Self-pay | Admitting: Internal Medicine

## 2018-08-27 VITALS — BP 150/88 | HR 90 | Temp 98.1°F | Ht 63.0 in | Wt 305.0 lb

## 2018-08-27 DIAGNOSIS — M25572 Pain in left ankle and joints of left foot: Secondary | ICD-10-CM

## 2018-08-27 DIAGNOSIS — M25571 Pain in right ankle and joints of right foot: Secondary | ICD-10-CM | POA: Diagnosis not present

## 2018-08-27 DIAGNOSIS — G8929 Other chronic pain: Secondary | ICD-10-CM | POA: Diagnosis not present

## 2018-08-27 NOTE — Assessment & Plan Note (Signed)
It is unclear how much this may impact her ability to hold employment. I do not suspect that she qualifies for disability and informed her of this. She is referred to orthopedics to evaluate her ankle and see if she can have any treatment to remove the pain so she might work.

## 2018-08-27 NOTE — Patient Instructions (Signed)
We will get you in with the orthopedic doctor to look at the ankles to see what they can do about the pain.

## 2018-08-27 NOTE — Progress Notes (Signed)
   Subjective:   Patient ID: [name], female    DOB: March 08, 1961, 58 y.o.   MRN: [id number]  HPI The patient is a 58 YO female coming in for concerns that she might qualify for disability. She was told that because she has arthritis she should get this. She has not worked since 2014. She has never been seen in the last 3-4 years for arthritis pain. She typically just comes for physical unless she is acutely sick. She has not seen any orthopedic doctors about her arthritis. Her ankles bother her due to past surgery with fracture some years ago. She has not kept seeing that surgeon. She is not currently actively seeking a job. She has not had any acute changes in her health recently.   Review of Systems  Constitutional: Negative.   HENT: Negative.   Eyes: Negative.   Respiratory: Negative for cough, chest tightness and shortness of breath.   Cardiovascular: Negative for chest pain, palpitations and leg swelling.  Gastrointestinal: Negative for abdominal distention, abdominal pain, constipation, diarrhea, nausea and vomiting.  Musculoskeletal: Positive for arthralgias.  Skin: Negative.   Neurological: Negative.   Psychiatric/Behavioral: Negative.     Objective:  Physical Exam Constitutional:      Appearance: She is well-developed. She is obese.  HENT:     Head: Normocephalic and atraumatic.  Neck:     Musculoskeletal: Normal range of motion.  Cardiovascular:     Rate and Rhythm: Normal rate and regular rhythm.  Pulmonary:     Effort: Pulmonary effort is normal. No respiratory distress.     Breath sounds: Normal breath sounds. No wheezing or rales.  Abdominal:     General: Bowel sounds are normal. There is no distension.     Palpations: Abdomen is soft.     Tenderness: There is no abdominal tenderness. There is no rebound.  Skin:    General: Skin is warm and dry.  Neurological:     Mental Status: She is alert and oriented to person, place, and time.     Coordination:  Coordination normal.     Vitals:   08/27/18 1459  BP: (!) 150/88  Pulse: 90  Temp: 98.1 F (36.7 C)  TempSrc: Oral  SpO2: 97%  Weight: (!) 305 lb (138.3 kg)  Height: 5\' 3"  (1.6 m)    Assessment & Plan:  Visit time 25 minutes: greater than 50% of that time was spent in face to face counseling and coordination of care with the patient: counseled about the nature of disability and going through her various medical problems and how they might impact her working or job

## 2018-09-26 ENCOUNTER — Observation Stay (HOSPITAL_COMMUNITY)
Admission: EM | Admit: 2018-09-26 | Discharge: 2018-09-28 | Disposition: A | Discharge status: Home / Self Care | Payer: BLUE CROSS/BLUE SHIELD | Attending: Surgery | Admitting: Surgery

## 2018-09-26 ENCOUNTER — Other Ambulatory Visit: Payer: Self-pay

## 2018-09-26 ENCOUNTER — Emergency Department (HOSPITAL_COMMUNITY): Payer: BLUE CROSS/BLUE SHIELD

## 2018-09-26 ENCOUNTER — Encounter (HOSPITAL_COMMUNITY): Payer: Self-pay | Admitting: Emergency Medicine

## 2018-09-26 DIAGNOSIS — Z79899 Other long term (current) drug therapy: Secondary | ICD-10-CM | POA: Diagnosis not present

## 2018-09-26 DIAGNOSIS — Z87891 Personal history of nicotine dependence: Secondary | ICD-10-CM | POA: Diagnosis not present

## 2018-09-26 DIAGNOSIS — K810 Acute cholecystitis: Secondary | ICD-10-CM

## 2018-09-26 DIAGNOSIS — I10 Essential (primary) hypertension: Secondary | ICD-10-CM | POA: Insufficient documentation

## 2018-09-26 DIAGNOSIS — K219 Gastro-esophageal reflux disease without esophagitis: Secondary | ICD-10-CM | POA: Diagnosis not present

## 2018-09-26 DIAGNOSIS — Z6841 Body Mass Index (BMI) 40.0 and over, adult: Secondary | ICD-10-CM | POA: Diagnosis not present

## 2018-09-26 DIAGNOSIS — K8012 Calculus of gallbladder with acute and chronic cholecystitis without obstruction: Secondary | ICD-10-CM | POA: Diagnosis not present

## 2018-09-26 DIAGNOSIS — K819 Cholecystitis, unspecified: Secondary | ICD-10-CM | POA: Diagnosis present

## 2018-09-26 DIAGNOSIS — R109 Unspecified abdominal pain: Secondary | ICD-10-CM | POA: Diagnosis present

## 2018-09-26 LAB — COMPREHENSIVE METABOLIC PANEL
ALT: 12 U/L (ref 0–44)
AST: 15 U/L (ref 15–41)
Albumin: 3.6 g/dL (ref 3.5–5.0)
Alkaline Phosphatase: 126 U/L (ref 38–126)
Anion gap: 8 (ref 5–15)
BUN: 8 mg/dL (ref 6–20)
CO2: 27 mmol/L (ref 22–32)
Calcium: 9 mg/dL (ref 8.9–10.3)
Chloride: 102 mmol/L (ref 98–111)
Creatinine, Ser: 0.75 mg/dL (ref 0.44–1.00)
GFR calc Af Amer: >60 mL/min (ref >60)
GFR calc non Af Amer: >60 mL/min (ref >60)
Glucose, Bld: 126 mg/dL — ABNORMAL HIGH (ref 70–99)
Potassium: 3.8 mmol/L (ref 3.5–5.1)
Sodium: 137 mmol/L (ref 135–145)
Total Bilirubin: 0.8 mg/dL (ref 0.3–1.2)
Total Protein: 8.3 g/dL — ABNORMAL HIGH (ref 6.5–8.1)

## 2018-09-26 LAB — CBC
HCT: 39.8 % (ref 36.0–46.0)
Hemoglobin: 12.2 g/dL (ref 12.0–15.0)
MCH: 26.1 pg (ref 26.0–34.0)
MCHC: 30.7 g/dL (ref 30.0–36.0)
MCV: 85.2 fL (ref 80.0–100.0)
Platelets: 248 10*3/uL (ref 150–400)
RBC: 4.67 MIL/uL (ref 3.87–5.11)
RDW: 13.5 % (ref 11.5–15.5)
WBC: 8 10*3/uL (ref 4.0–10.5)
nRBC: 0 % (ref 0.0–0.2)

## 2018-09-26 LAB — URINALYSIS, ROUTINE W REFLEX MICROSCOPIC
Bacteria, UA: NONE SEEN
Bilirubin Urine: NEGATIVE
Glucose, UA: NEGATIVE mg/dL
Ketones, ur: NEGATIVE mg/dL
Nitrite: NEGATIVE
Protein, ur: NEGATIVE mg/dL
Specific Gravity, Urine: 1.015 (ref 1.005–1.030)
pH: 8 (ref 5.0–8.0)

## 2018-09-26 LAB — LIPASE, BLOOD: Lipase: 21 U/L (ref 11–51)

## 2018-09-26 LAB — I-STAT BETA HCG BLOOD, ED (MC, WL, AP ONLY): I-stat hCG, quantitative: <5 m[IU]/mL (ref <5)

## 2018-09-26 MED ORDER — SODIUM CHLORIDE 0.9 % IV BOLUS
1000.0000 mL | Freq: Once | INTRAVENOUS | Status: AC
  Administered 2018-09-26: 1000 mL via INTRAVENOUS

## 2018-09-26 MED ORDER — TRAMADOL HCL 50 MG PO TABS: 50.0000 mg | ORAL_TABLET | Freq: Four times a day (QID) | ORAL | Status: DC | PRN

## 2018-09-26 MED ORDER — ONDANSETRON 4 MG PO TBDP: 4.0000 mg | ORAL_TABLET | Freq: Four times a day (QID) | ORAL | Status: DC | PRN

## 2018-09-26 MED ORDER — PANTOPRAZOLE SODIUM 40 MG IV SOLR
40.0000 mg | Freq: Every day | INTRAVENOUS | Status: DC
  Administered 2018-09-26 – 2018-09-27 (×2): 40 mg via INTRAVENOUS
  Filled 2018-09-26 (×2): qty 40

## 2018-09-26 MED ORDER — ONDANSETRON HCL 4 MG/2ML IJ SOLN
4.0000 mg | Freq: Four times a day (QID) | INTRAMUSCULAR | Status: DC | PRN
  Administered 2018-09-27: 4 mg via INTRAVENOUS
  Filled 2018-09-26: qty 2

## 2018-09-26 MED ORDER — ZOLPIDEM TARTRATE 5 MG PO TABS: 5.0000 mg | ORAL_TABLET | Freq: Every evening | ORAL | Status: DC | PRN

## 2018-09-26 MED ORDER — ONDANSETRON HCL 4 MG/2ML IJ SOLN
4.0000 mg | Freq: Once | INTRAMUSCULAR | Status: AC
  Administered 2018-09-26: 4 mg via INTRAVENOUS
  Filled 2018-09-26: qty 2

## 2018-09-26 MED ORDER — IBUPROFEN 600 MG PO TABS: 600.0000 mg | ORAL_TABLET | Freq: Four times a day (QID) | ORAL | Status: DC | PRN

## 2018-09-26 MED ORDER — DIPHENHYDRAMINE HCL 25 MG PO CAPS: 25.0000 mg | ORAL_CAPSULE | Freq: Four times a day (QID) | ORAL | Status: DC | PRN

## 2018-09-26 MED ORDER — HYDRALAZINE HCL 20 MG/ML IJ SOLN
10.0000 mg | INTRAMUSCULAR | Status: DC | PRN
  Administered 2018-09-26 – 2018-09-27 (×2): 10 mg via INTRAVENOUS
  Filled 2018-09-26 (×2): qty 1

## 2018-09-26 MED ORDER — METOPROLOL TARTRATE 5 MG/5ML IV SOLN: 5.0000 mg | Freq: Four times a day (QID) | INTRAVENOUS | Status: DC | PRN

## 2018-09-26 MED ORDER — SODIUM CHLORIDE 0.9 % IV SOLN
INTRAVENOUS | Status: DC
  Administered 2018-09-27: 09:00:00 via INTRAVENOUS

## 2018-09-26 MED ORDER — BISACODYL 10 MG RE SUPP: 10.0000 mg | Freq: Every day | RECTAL | Status: DC | PRN

## 2018-09-26 MED ORDER — CROMOLYN SODIUM 5.2 MG/ACT NA AERS
1.0000 | INHALATION_SPRAY | Freq: Four times a day (QID) | NASAL | Status: DC
  Filled 2018-09-26: qty 26

## 2018-09-26 MED ORDER — OXYCODONE HCL 5 MG PO TABS: 5.0000 mg | ORAL_TABLET | ORAL | Status: DC | PRN

## 2018-09-26 MED ORDER — HYDROMORPHONE HCL 1 MG/ML IJ SOLN
0.5000 mg | INTRAMUSCULAR | Status: DC | PRN
  Administered 2018-09-26 – 2018-09-27 (×2): 0.5 mg via INTRAVENOUS
  Filled 2018-09-26 (×2): qty 1

## 2018-09-26 MED ORDER — FENTANYL CITRATE (PF) 100 MCG/2ML IJ SOLN
25.0000 ug | Freq: Once | INTRAMUSCULAR | Status: AC
  Administered 2018-09-26: 25 ug via INTRAVENOUS
  Filled 2018-09-26: qty 2

## 2018-09-26 MED ORDER — TELMISARTAN-HCTZ 80-25 MG PO TABS: 1.0000 | ORAL_TABLET | Freq: Every day | ORAL | Status: DC

## 2018-09-26 MED ORDER — HYDROCHLOROTHIAZIDE 25 MG PO TABS
25.0000 mg | ORAL_TABLET | Freq: Every day | ORAL | Status: DC
  Administered 2018-09-26 – 2018-09-28 (×3): 25 mg via ORAL
  Filled 2018-09-26 (×3): qty 1

## 2018-09-26 MED ORDER — ENOXAPARIN SODIUM 40 MG/0.4ML ~~LOC~~ SOLN
40.0000 mg | SUBCUTANEOUS | Status: DC
  Administered 2018-09-26: 40 mg via SUBCUTANEOUS
  Filled 2018-09-26: qty 0.4

## 2018-09-26 MED ORDER — CIPROFLOXACIN IN D5W 400 MG/200ML IV SOLN
400.0000 mg | Freq: Two times a day (BID) | INTRAVENOUS | Status: DC
  Administered 2018-09-26 – 2018-09-28 (×4): 400 mg via INTRAVENOUS
  Filled 2018-09-26 (×5): qty 200

## 2018-09-26 MED ORDER — DIPHENHYDRAMINE HCL 50 MG/ML IJ SOLN: 25.0000 mg | Freq: Four times a day (QID) | INTRAMUSCULAR | Status: DC | PRN

## 2018-09-26 MED ORDER — DOCUSATE SODIUM 100 MG PO CAPS
100.0000 mg | ORAL_CAPSULE | Freq: Two times a day (BID) | ORAL | Status: DC
  Administered 2018-09-27 – 2018-09-28 (×3): 100 mg via ORAL
  Filled 2018-09-26 (×3): qty 1

## 2018-09-26 MED ORDER — MORPHINE SULFATE (PF) 2 MG/ML IV SOLN
2.0000 mg | Freq: Once | INTRAVENOUS | Status: AC
  Administered 2018-09-26: 2 mg via INTRAVENOUS
  Filled 2018-09-26: qty 1

## 2018-09-26 MED ORDER — METHOCARBAMOL 1000 MG/10ML IJ SOLN
500.0000 mg | Freq: Four times a day (QID) | INTRAVENOUS | Status: DC | PRN
  Filled 2018-09-26: qty 5

## 2018-09-26 MED ORDER — MONTELUKAST SODIUM 10 MG PO TABS: 10.0000 mg | ORAL_TABLET | Freq: Every evening | ORAL | Status: DC

## 2018-09-26 MED ORDER — ACETAMINOPHEN 500 MG PO TABS
1000.0000 mg | ORAL_TABLET | Freq: Four times a day (QID) | ORAL | Status: DC
  Administered 2018-09-26 – 2018-09-28 (×6): 1000 mg via ORAL
  Filled 2018-09-26 (×6): qty 2

## 2018-09-26 MED ORDER — SODIUM CHLORIDE 0.9% FLUSH: 3.0000 mL | Freq: Once | INTRAVENOUS | Status: DC

## 2018-09-26 MED ORDER — IRBESARTAN 300 MG PO TABS
300.0000 mg | ORAL_TABLET | Freq: Every day | ORAL | Status: DC
  Administered 2018-09-26 – 2018-09-28 (×3): 300 mg via ORAL
  Filled 2018-09-26 (×3): qty 1

## 2018-09-26 NOTE — H&P (Addendum)
Surgical H&P  CC: abdominal pain, vomiting  HPI: This is a very nice 58 year old woman who presented to the emergency room early this morning with acute onset upper abdominal pain associated with multiple episodes of vomiting.  This began around 11 PM last night and has continued to intensify since that time.  She states she has vomited about 12 times.  It is a sharp aching pain and is situated in the epigastrium and right upper quadrant.  Denies radiation or any change in the quality or location since initial symptoms.  Her work-up in the emergency room has included a CBC and a CMP which are normal; urinalysis positive for leukocytes but she has no urinary symptoms, and ultrasound [place] cholelithiasis with a 13 mm stone nonmobile in the gallbladder neck, with positive Murphy sign.  No gallbladder wall thickening or pericholecystic fluid, common bile duct is 5 mm.  Prior abdominal surgery includes hysterectomy via low midline.  She lives home with her husband who is at bedside along with 2 daughters. She is a Agricultural engineer.    Allergies  Allergen Reactions  . Penicillins Hives    Has patient had a PCN reaction causing immediate rash, facial/tongue/throat swelling, SOB or lightheadedness with hypotension: Yes Has patient had a PCN reaction causing severe rash involving mucus membranes or skin necrosis: Yes Has patient had a PCN reaction that required hospitalization: Yes Has patient had a PCN reaction occurring within the last 10 years: Yes If all of the above answers are "NO", then may proceed with Cephalosporin use.     Past Medical History:  Diagnosis Date  . Bronchitis   . Complication of anesthesia    hard to wake up years ago  . Environmental and seasonal allergies    dust mites, dust, cats,trees,  . GERD (gastroesophageal reflux disease)   . Headache   . Heart murmur   . Hypertension   . Shortness of breath dyspnea    with exertion    Past Surgical History:  Procedure  Laterality Date  . ABDOMINAL HYSTERECTOMY    . CYST REMOVAL HAND    . NASAL SINUS SURGERY    . ORIF ANKLE FRACTURE Right 07/30/2015   Procedure: OPEN REDUCTION INTERNAL FIXATION (ORIF) RIGHT  ANKLE ;  Surgeon: Rod Can, MD;  Location: [facility];  Service: Orthopedics;  Laterality: Right;    No family history on file.  Social History   Socioeconomic History  . Marital status: Married    Spouse name: Not on file  . Number of children: Not on file  . Years of education: Not on file  . Highest education level: Not on file  Occupational History  . Occupation: Housewife     Employer: [employer]  Social Needs  . Financial resource strain: Not on file  . Food insecurity:    Worry: Not on file    Inability: Not on file  . Transportation needs:    Medical: Not on file    Non-medical: Not on file  Tobacco Use  . Smoking status: Former Smoker    Packs/day: 1.00    Years: 35.00    Pack years: 35.00    Types: Cigarettes    Last attempt to quit: 09/18/2013    Years since quitting: 5.0  . Smokeless tobacco: Never Used  Substance and Sexual Activity  . Alcohol use: No  . Drug use: No  . Sexual activity: Not on file  Lifestyle  . Physical activity:    Days per week: Not  on file    Minutes per session: Not on file  . Stress: Not on file  Relationships  . Social connections:    Talks on phone: Not on file    Gets together: Not on file    Attends religious service: Not on file    Active member of club or organization: Not on file    Attends meetings of clubs or organizations: Not on file    Relationship status: Not on file  Other Topics Concern  . Not on file  Social History Narrative   Was in the [facility] x1 year    No current facility-administered medications on file prior to encounter.    Current Outpatient Medications on File Prior to Encounter  Medication Sig Dispense Refill  . acetaminophen (TYLENOL) 500 MG tablet Take 1,000 mg by mouth every 6 (six) hours as  needed for mild pain or headache.    . cromolyn (NASALCROM) 5.2 MG/ACT nasal spray Place 1 spray into both nostrils 4 (four) times daily. 26 mL 12  . fluticasone (FLONASE) 50 MCG/ACT nasal spray Place 2 sprays into both nostrils daily as needed for allergies.     Marland Kitchen ibuprofen (ADVIL,MOTRIN) 200 MG tablet Take 400 mg by mouth every 6 (six) hours as needed for moderate pain.    Marland Kitchen levocetirizine (XYZAL) 5 MG tablet Take 5 mg by mouth every evening. Reported on 11/12/2015  3  . montelukast (SINGULAIR) 10 MG tablet Take 10 mg by mouth every evening.  3  . pantoprazole (PROTONIX) 40 MG tablet TAKE 1 TABLET BY MOUTH EVERY DAY 90 tablet 0  . telmisartan-hydrochlorothiazide (MICARDIS HCT) 80-25 MG tablet TAKE 1 TABLET BY MOUTH EVERY DAY 90 tablet 1  . triamcinolone cream (KENALOG) 0.1 % Apply 1 application topically 2 (two) times daily. 30 g 0  . TURMERIC CURCUMIN PO Take 500 mg by mouth daily.       Review of Systems: a complete, 10pt review of systems was completed with pertinent positives and negatives as documented in the HPI  Physical Exam: Vitals:   09/26/18 1215 09/26/18 1230  BP: (!) 176/72 (!) 176/73  Pulse: 68 71  Resp:    Temp:    SpO2: 98% 97%   Gen: A&Ox3, no distress  Head: normocephalic, atraumatic Eyes: extraocular motions intact, anicteric.  Neck: supple without mass or thyromegaly Chest: unlabored respirations, symmetrical air entry, clear bilaterally   Cardiovascular: RRR with palpable distal pulses, no pedal edema Abdomen: Obese, nondistended, exquisitely tender in the epigastrium and right upper abdomen as well as in the periumbilical field.  Patient exhibits voluntary guarding, pulling away from my hand but there is no involuntary guarding or peritonitis. no mass or organomegaly.  Extremities: warm, without edema, no deformities  Neuro: grossly intact Psych: appropriate mood and affect, normal insight  Skin: warm and dry   CBC Latest Ref Rng & Units 09/26/2018 08/02/2018  07/27/2017  WBC 4.0 - 10.5 K/uL 8.0 7.0 7.6  Hemoglobin 12.0 - 15.0 g/dL 12.2 12.0 12.3  Hematocrit 36.0 - 46.0 % 39.8 36.4 38.2  Platelets 150 - 400 K/uL 248 249.0 251.0    CMP Latest Ref Rng & Units 09/26/2018 08/02/2018 07/27/2017  Glucose 70 - 99 mg/dL 126(H) 108(H) 99  BUN 6 - 20 mg/dL 8 9 13   Creatinine 0.44 - 1.00 mg/dL 0.75 0.84 0.77  Sodium 135 - 145 mmol/L 137 138 140  Potassium 3.5 - 5.1 mmol/L 3.8 3.5 3.7  Chloride 98 - 111 mmol/L 102 101 101  CO2 22 - 32 mmol/L 27 29 30   Calcium 8.9 - 10.3 mg/dL 9.0 9.3 9.1  Total Protein 6.5 - 8.1 g/dL 8.3(H) 7.9 7.8  Total Bilirubin 0.3 - 1.2 mg/dL 0.8 0.6 0.6  Alkaline Phos 38 - 126 U/L 126 114 120(H)  AST 15 - 41 U/L 15 11 10   ALT 0 - 44 U/L 12 9 7     No results found for: INR, PROTIME  Imaging: US Abdomen Complete  Result Date: 09/26/2018 CLINICAL DATA:  Abdominal pain radiating to the right flank since last night. EXAM: ABDOMEN ULTRASOUND COMPLETE COMPARISON:  None. FINDINGS: Examination limited by patient body habitus. Gallbladder: There is a nonmobile 13 mm shadowing gallstone in the gallbladder neck with sonographic Murphy's sign. No gallbladder wall thickening or pericholecystic fluid. No gallbladder distention. Common bile duct: Diameter: 5 mm on limited views Liver: Liver parenchyma is diffusely mildly echogenic. No definite liver surface irregularity. No liver mass demonstrated, noting decreased sensitivity in the setting of an echogenic liver. Portal vein is patent on color Doppler imaging with normal direction of blood flow towards the liver. IVC: No abnormality visualized. Pancreas: Visualized portion unremarkable. Spleen: Size and appearance within normal limits. Right Kidney: Length: 11.0 cm. Normal right renal parenchymal echogenicity and thickness. No right hydronephrosis. Poorly visualized 2.9 x 2.5 x 3.5 cm upper right renal cyst with thin internal septation and lobulated outer contour. Separate simple 1.9 x 1.5 x 2.1 cm upper  right renal cyst. Questionable 6 mm shadowing upper right renal stone. Left Kidney: Length: 11.6 cm. Echogenicity within normal limits. No mass or hydronephrosis visualized. Abdominal aorta: No aneurysm visualized. Other findings: None. IMPRESSION: 1. Cholelithiasis with nonmobile gallstone in the gallbladder neck. Sonographic Percell Miller sign is present. Findings suggest acute cholecystitis. 2. No biliary ductal dilatation. 3. Mildly echogenic liver parenchyma, a nonspecific finding most commonly due to diffuse hepatic steatosis. 4. Poorly visualized upper right renal cysts due to patient related limitations. Suggest either attention on follow-up renal sonogram in 6 months or further evaluation with short-term outpatient renal mass protocol MRI abdomen without and with IV contrast. 5. Questionable right nephrolithiasis.  No hydronephrosis. Electronically Signed   By: Ilona Sorrel M.D.   On: 09/26/2018 15:13     A/P: 58 year old woman with likely early acute cholecystitis. I recommend proceeding with laparoscopic cholecystectomy with possible cholangiogram. Discussed risks of surgery including bleeding, pain, scarring, intraabdominal injury specifically to the common bile duct and sequelae, conversion to open surgery, blood clot, pneumonia, heart attack, stroke, failure to resolve symptoms, etc. Questions welcomed and answered.  Tentatively plan for surgery tomorrow with Dr. Brantley Stage, patient was advised that on occasion we do have to delay gallbladder surgeries an extra day depending on OR availability.   Incidentally noted likely hepatic steatosis and upper right renal cysts-recommend outpatient follow-up with primary care doctor to determine further imaging needs.  Hypertension- continue home meds (telmisartan hctz), metoprolol/ hydralazine iv prn GERD- PPI  Romana Juniper, MD [facility], [place] Pager [phone]

## 2018-09-26 NOTE — ED Notes (Signed)
Patient transported to Ultrasound 

## 2018-09-26 NOTE — ED Triage Notes (Signed)
Patient reports generalized abdominal pain that radiates to R flank and vomiting that began around 11pm last night. Patient denies fevers, chills or diarrhea.

## 2018-09-26 NOTE — ED Provider Notes (Signed)
  Physical Exam  BP (!) 176/73   Pulse 71   Temp 98.5 F (36.9 C) (Oral)   Resp 16   SpO2 97%   Physical Exam Vitals signs and nursing note reviewed.  Constitutional:      General: She is not in acute distress.    Appearance: She is well-developed. She is not diaphoretic.  HENT:     Head: Normocephalic and atraumatic.  Eyes:     General: No scleral icterus.    Conjunctiva/sclera: Conjunctivae normal.  Neck:     Musculoskeletal: Normal range of motion.  Pulmonary:     Effort: Pulmonary effort is normal. No respiratory distress.  Skin:    Findings: No rash.  Neurological:     Mental Status: She is alert.     ED Course/Procedures     Procedures  MDM  Care handed off from previous provider PA Soto.  Please see their note for further detail.  Briefly, patient is a 58 year old female with a past medical history of GERD, hypertension who presents to the ED with a chief complaint of abdominal pain for the past 12 hours.  She has had nonbloody, nonbilious emesis with no improvement with over-the-counter medications.  Lab work significant for right upper quadrant ultrasound showing cholecystitis.  LFTs are within normal limits. Afebrile. Plan is to consult general surgery for disposition.  5:20 PM Dr. Kae Heller in to see patient. They will admit for ccx.       Delia Heady, PA-C 09/26/18 [place], [place], DO 09/26/18 1742

## 2018-09-26 NOTE — ED Notes (Signed)
This RN explained to pt about the process of progression beds. Pt and family were both not happy about pt having to move to hallway. This RN explained why she was being moved and that she was stable enough to move to the hallway. Family requested to talk to charge. This RN informed charge of the situation.  Pt was placed in the progression bed with blankets and privacy curtains placed around her.

## 2018-09-26 NOTE — ED Provider Notes (Signed)
[facility] EMERGENCY DEPARTMENT Provider Note   CSN: [id number] Arrival date & time: 09/26/18  9326    History   Chief Complaint Chief Complaint  Patient presents with  . Abdominal Pain  . Emesis    HPI [name] is a 58 y.o. female.     58 y.o female with a PMH GERD, HTN presents to the ED with a chief complaint of abdominal pain x 12 hours.  Patient reports she began feeling a upper abdominal ache last night, she had multiple episodes of vomiting, nonbloody yellow in description.  Reports taking some Pepto-Bismol but immediately vomited afterwards.  She endorses nausea.  No alleviating or exacerbating factors.  Her last bowel movement was last night around 11 PM.  Previous surgical history includes a hysterectomy.  She denies any fever, urinary symptoms, diarrhea, chest pain.     Past Medical History:  Diagnosis Date  . Bronchitis   . Complication of anesthesia    hard to wake up years ago  . Environmental and seasonal allergies    dust mites, dust, cats,trees,  . GERD (gastroesophageal reflux disease)   . Headache   . Heart murmur   . Hypertension   . Shortness of breath dyspnea    with exertion    Patient Active Problem List   Diagnosis Date Noted  . Chronic pain of both ankles 08/27/2018  . GERD (gastroesophageal reflux disease) 07/27/2017  . Allergic rhinitis 05/11/2015  . Severe obesity (BMI >= 40) ([place]) 05/04/2014  . Routine general medical examination at a health care facility 05/04/2014  . Essential hypertension, benign 01/19/2014    Past Surgical History:  Procedure Laterality Date  . ABDOMINAL HYSTERECTOMY    . CYST REMOVAL HAND    . NASAL SINUS SURGERY    . ORIF ANKLE FRACTURE Right 07/30/2015   Procedure: OPEN REDUCTION INTERNAL FIXATION (ORIF) RIGHT  ANKLE ;  Surgeon: Rod Can, MD;  Location: [facility];  Service: Orthopedics;  Laterality: Right;     OB History   No obstetric history on file.      Home Medications     Prior to Admission medications   Medication Sig Start Date End Date Taking? Authorizing Provider  acetaminophen (TYLENOL) 500 MG tablet Take 1,000 mg by mouth every 6 (six) hours as needed for mild pain or headache.   Yes [provider]  cromolyn (NASALCROM) 5.2 MG/ACT nasal spray Place 1 spray into both nostrils 4 (four) times daily. 09/17/17  Yes Burky, Malachy Moan, NP  fluticasone (FLONASE) 50 MCG/ACT nasal spray Place 2 sprays into both nostrils daily as needed for allergies.  12/13/13  Yes [provider]  ibuprofen (ADVIL,MOTRIN) 200 MG tablet Take 400 mg by mouth every 6 (six) hours as needed for moderate pain.   Yes [provider]  levocetirizine (XYZAL) 5 MG tablet Take 5 mg by mouth every evening. Reported on 11/12/2015 07/02/15  Yes [provider]  montelukast (SINGULAIR) 10 MG tablet Take 10 mg by mouth every evening. 07/02/15  Yes [provider]  pantoprazole (PROTONIX) 40 MG tablet TAKE 1 TABLET BY MOUTH EVERY DAY 07/28/18  Yes Hoyt Koch, MD  telmisartan-hydrochlorothiazide (MICARDIS HCT) 80-25 MG tablet TAKE 1 TABLET BY MOUTH EVERY DAY 08/03/18  Yes Hoyt Koch, MD  triamcinolone cream (KENALOG) 0.1 % Apply 1 application topically 2 (two) times daily. 08/02/18  Yes Hoyt Koch, MD  TURMERIC CURCUMIN PO Take 500 mg by mouth daily.  Yes [provider]    Family History No family history on file.  Social History Social History   Tobacco Use  . Smoking status: Former Smoker    Packs/day: 1.00    Years: 35.00    Pack years: 35.00    Types: Cigarettes    Last attempt to quit: 09/18/2013    Years since quitting: 5.0  . Smokeless tobacco: Never Used  Substance Use Topics  . Alcohol use: No  . Drug use: No     Allergies   Penicillins   Review of Systems Review of Systems  Constitutional: Negative for chills and fever.  HENT: Negative for ear pain and sore throat.   Eyes: Negative for  pain and visual disturbance.  Respiratory: Negative for cough and shortness of breath.   Cardiovascular: Negative for chest pain and palpitations.  Gastrointestinal: Positive for abdominal pain, nausea and vomiting.  Genitourinary: Negative for dysuria, flank pain, hematuria, vaginal bleeding and vaginal discharge.  Musculoskeletal: Negative for arthralgias and back pain.  Skin: Negative for color change and rash.  Neurological: Negative for seizures and syncope.  All other systems reviewed and are negative.    Physical Exam Updated Vital Signs BP (!) 176/73   Pulse 71   Temp 98.5 F (36.9 C) (Oral)   Resp 16   SpO2 97%   Physical Exam Vitals signs and nursing note reviewed.  Constitutional:      General: She is not in acute distress.    Appearance: She is well-developed.     Comments: Well-appearing, resting in stretcher with husband at the bedside.  HENT:     Head: Normocephalic and atraumatic.     Mouth/Throat:     Pharynx: No oropharyngeal exudate.  Eyes:     Pupils: Pupils are equal, round, and reactive to light.  Neck:     Musculoskeletal: Normal range of motion.  Cardiovascular:     Rate and Rhythm: Regular rhythm.     Heart sounds: Normal heart sounds.  Pulmonary:     Effort: Pulmonary effort is normal. No respiratory distress.     Breath sounds: Normal breath sounds.     Comments: Lungs are clear to auscultation. Abdominal:     General: Bowel sounds are normal. There is no distension.     Palpations: Abdomen is soft.     Tenderness: There is abdominal tenderness in the right upper quadrant, epigastric area and left upper quadrant. There is no right CVA tenderness, left CVA tenderness or rebound. Positive signs include Murphy's sign. Negative signs include Rovsing's sign, McBurney's sign and psoas sign.     Comments: Bowel sounds are diminished, tenderness along the right upper quadrant > left upper quadrant.  After abdominal evaluation patient is teary-eyed.    Musculoskeletal:        General: No tenderness or deformity.     Right lower leg: No edema.     Left lower leg: No edema.  Skin:    General: Skin is warm and dry.  Neurological:     Mental Status: She is alert and oriented to person, place, and time.      ED Treatments / Results  Labs (all labs ordered are listed, but only abnormal results are displayed) Labs Reviewed  COMPREHENSIVE METABOLIC PANEL - Abnormal; Notable for the following components:      Result Value   Glucose, Bld 126 (*)    Total Protein 8.3 (*)    All other components within normal limits  URINALYSIS, ROUTINE  W REFLEX MICROSCOPIC - Abnormal; Notable for the following components:   Hgb urine dipstick SMALL (*)    Leukocytes,Ua MODERATE (*)    All other components within normal limits  LIPASE, BLOOD  CBC  I-STAT BETA HCG BLOOD, ED ([facility], [facility], [facility] ONLY)    EKG None  Radiology US Abdomen Complete  Result Date: 09/26/2018 CLINICAL DATA:  Abdominal pain radiating to the right flank since last night. EXAM: ABDOMEN ULTRASOUND COMPLETE COMPARISON:  None. FINDINGS: Examination limited by patient body habitus. Gallbladder: There is a nonmobile 13 mm shadowing gallstone in the gallbladder neck with sonographic Murphy's sign. No gallbladder wall thickening or pericholecystic fluid. No gallbladder distention. Common bile duct: Diameter: 5 mm on limited views Liver: Liver parenchyma is diffusely mildly echogenic. No definite liver surface irregularity. No liver mass demonstrated, noting decreased sensitivity in the setting of an echogenic liver. Portal vein is patent on color Doppler imaging with normal direction of blood flow towards the liver. IVC: No abnormality visualized. Pancreas: Visualized portion unremarkable. Spleen: Size and appearance within normal limits. Right Kidney: Length: 11.0 cm. Normal right renal parenchymal echogenicity and thickness. No right hydronephrosis. Poorly visualized 2.9 x 2.5 x 3.5 cm upper right  renal cyst with thin internal septation and lobulated outer contour. Separate simple 1.9 x 1.5 x 2.1 cm upper right renal cyst. Questionable 6 mm shadowing upper right renal stone. Left Kidney: Length: 11.6 cm. Echogenicity within normal limits. No mass or hydronephrosis visualized. Abdominal aorta: No aneurysm visualized. Other findings: None. IMPRESSION: 1. Cholelithiasis with nonmobile gallstone in the gallbladder neck. Sonographic Percell Miller sign is present. Findings suggest acute cholecystitis. 2. No biliary ductal dilatation. 3. Mildly echogenic liver parenchyma, a nonspecific finding most commonly due to diffuse hepatic steatosis. 4. Poorly visualized upper right renal cysts due to patient related limitations. Suggest either attention on follow-up renal sonogram in 6 months or further evaluation with short-term outpatient renal mass protocol MRI abdomen without and with IV contrast. 5. Questionable right nephrolithiasis.  No hydronephrosis. Electronically Signed   By: Ilona Sorrel M.D.   On: 09/26/2018 15:13    Procedures Procedures (including critical care time)  Medications Ordered in ED Medications  sodium chloride flush (NS) 0.9 % injection 3 mL (has no administration in time range)  sodium chloride 0.9 % bolus 1,000 mL (0 mLs Intravenous Stopped 09/26/18 1441)  ondansetron (ZOFRAN) injection 4 mg (4 mg Intravenous Given 09/26/18 1002)  fentaNYL (SUBLIMAZE) injection 25 mcg (25 mcg Intravenous Given 09/26/18 1017)  morphine 2 MG/ML injection 2 mg (2 mg Intravenous Given 09/26/18 1314)  ondansetron (ZOFRAN) injection 4 mg (4 mg Intravenous Given 09/26/18 1505)     Initial Impression / Assessment and Plan / ED Course  I have reviewed the triage vital signs and the nursing notes.  Pertinent labs & imaging results that were available during my care of the patient were reviewed by me and considered in my medical decision making (see chart for details).       Presents with a previous history of  hypertension and GERD reports abdominal pain since last night, has not eaten since yesterday evening.  Reports pain is mostly on the upper quadrant.  Had multiple episodes of vomiting while she has been here, also endorses some nausea.  Evaluation patient is teary-eyed on exam, significant tenderness to the right upper quadrant. CMP showed no electrolyte normality, LFTs are within normal limits glucose is slightly elevated.  No leukocytosis, hemoglobin is within normal limits.  Lipase is within normal  limits.  UA showed small amount of blood, moderate leukocytes 21-50 whites, she denies any current urinary symptoms. Will send urine for culture.  Beta was negative.  Patient was provided with bolus along with Zofran, also provided with fentanyl, she was provided again with some morphine for her pain she is had relieving pain.  Awaiting ultrasound results. US Abdomen showed: 1. Cholelithiasis with nonmobile gallstone in the gallbladder neck.  Sonographic Percell Miller sign is present. Findings suggest acute  cholecystitis.  2. No biliary ductal dilatation.  3. Mildly echogenic liver parenchyma, a nonspecific finding most  commonly due to diffuse hepatic steatosis.  4. Poorly visualized upper right renal cysts due to patient related  limitations. Suggest either attention on follow-up renal sonogram in  6 months or further evaluation with short-term outpatient renal mass  protocol MRI abdomen without and with IV contrast.  5. Questionable right nephrolithiasis. No hydronephrosis.     Patient was provided with Phenergan to help with her symptoms.  Will place call for general surgery for their recommendations.  General surgery consult was placed, due to shift change will sign out to incoming provider.  Pending surgery disposition.  Patient provided with Phenergan, informed of plan.  Final Clinical Impressions(s) / ED Diagnoses   Final diagnoses:  Acute cholecystitis    ED Discharge Orders    None         Janeece Fitting, Hershal [place]ia 09/26/18 1618    Carmin Muskrat, MD 09/26/18 [phone]

## 2018-09-26 NOTE — ED Notes (Signed)
ED TO INPATIENT HANDOFF REPORT  ED Nurse Name and Phone #: Sherrine Maples [phone]  S Name/Age/Gender [name] 58 y.o. female Room/Bed: H019C/H019C  Code Status   Code Status: Full Code  Home/SNF/Other Home Patient oriented to: self, place, time and situation Is this baseline? Yes   Triage Complete: Triage complete  Chief Complaint abd pain/vomiting  Triage Note Patient reports generalized abdominal pain that radiates to R flank and vomiting that began around 11pm last night. Patient denies fevers, chills or diarrhea.    Allergies Allergies  Allergen Reactions  . Penicillins Hives    Has patient had a PCN reaction causing immediate rash, facial/tongue/throat swelling, SOB or lightheadedness with hypotension: Yes Has patient had a PCN reaction causing severe rash involving mucus membranes or skin necrosis: Yes Has patient had a PCN reaction that required hospitalization: Yes Has patient had a PCN reaction occurring within the last 10 years: Yes If all of the above answers are "NO", then may proceed with Cephalosporin use.     Level of Care/Admitting Diagnosis ED Disposition    ED Disposition Condition Comment   Admit  Hospital Area: [facility] [[id number]]  Level of Care: Med-Surg [16]  Diagnosis: Cholecystitis [025852]  Admitting Physician: CCS, Clio  Attending Physician: CCS, MD [[id number]]  PT Class (Do Not Modify): Observation [104]  PT Acc Code (Do Not Modify): Observation [10022]       B Medical/Surgery History Past Medical History:  Diagnosis Date  . Bronchitis   . Complication of anesthesia    hard to wake up years ago  . Environmental and seasonal allergies    dust mites, dust, cats,trees,  . GERD (gastroesophageal reflux disease)   . Headache   . Heart murmur   . Hypertension   . Shortness of breath dyspnea    with exertion   Past Surgical History:  Procedure Laterality Date  . ABDOMINAL HYSTERECTOMY    . CYST REMOVAL HAND     . NASAL SINUS SURGERY    . ORIF ANKLE FRACTURE Right 07/30/2015   Procedure: OPEN REDUCTION INTERNAL FIXATION (ORIF) RIGHT  ANKLE ;  Surgeon: Rod Can, MD;  Location: [facility];  Service: Orthopedics;  Laterality: Right;     A IV Location/Drains/Wounds Patient Lines/Drains/Airways Status   Active Line/Drains/Airways    Name:   Placement date:   Placement time:   Site:   Days:   Peripheral IV 07/30/15 Left Hand   07/30/15    1541    Hand   1154   Peripheral IV 09/26/18 Right Antecubital   09/26/18    1004    Antecubital   less than 1   Incision (Closed) 07/30/15 Ankle Right   07/30/15    1953     1154          Intake/Output Last 24 hours  Intake/Output Summary (Last 24 hours) at 09/26/2018 1811 Last data filed at 09/26/2018 1441 Gross per 24 hour  Intake 1000 ml  Output -  Net 1000 ml    Labs/Imaging Results for orders placed or performed during the hospital encounter of 09/26/18 (from the past 48 hour(s))  Lipase, blood     Status: None   Collection Time: 09/26/18  6:32 AM  Result Value Ref Range   Lipase 21 11 - 51 U/L    Comment: Performed at [facility], [place] [address], [place], [place] [address]  Comprehensive metabolic panel     Status: Abnormal   Collection Time:  09/26/18  6:32 AM  Result Value Ref Range   Sodium 137 135 - 145 mmol/L   Potassium 3.8 3.5 - 5.1 mmol/L   Chloride 102 98 - 111 mmol/L   CO2 27 22 - 32 mmol/L   Glucose, Bld 126 (H) 70 - 99 mg/dL   BUN 8 6 - 20 mg/dL   Creatinine, Ser 0.75 0.44 - 1.00 mg/dL   Calcium 9.0 8.9 - 10.3 mg/dL   Total Protein 8.3 (H) 6.5 - 8.1 g/dL   Albumin 3.6 3.5 - 5.0 g/dL   AST 15 15 - 41 U/L   ALT 12 0 - 44 U/L   Alkaline Phosphatase 126 38 - 126 U/L   Total Bilirubin 0.8 0.3 - 1.2 mg/dL   GFR calc non Af Amer >60 >60 mL/min   GFR calc Af Amer >60 >60 mL/min   Anion gap 8 5 - 15    Comment: Performed at [facility], [address], [place], [place] [address]  CBC     Status: None   Collection Time:  09/26/18  6:32 AM  Result Value Ref Range   WBC 8.0 4.0 - 10.5 K/uL   RBC 4.67 3.87 - 5.11 MIL/uL   Hemoglobin 12.2 12.0 - 15.0 g/dL   HCT 39.8 36.0 - 46.0 %   MCV 85.2 80.0 - 100.0 fL   MCH 26.1 26.0 - 34.0 pg   MCHC 30.7 30.0 - 36.0 g/dL   RDW 13.5 11.5 - 15.5 %   Platelets 248 150 - 400 K/uL   nRBC 0.0 0.0 - 0.2 %    Comment: Performed at [facility], [place] [address] [place], [place] [address]  I-Stat beta hCG blood, ED     Status: None   Collection Time: 09/26/18  6:40 AM  Result Value Ref Range   I-stat hCG, quantitative <5.0 <5 mIU/mL   Comment 3            Comment:   GEST. AGE      CONC.  (mIU/mL)   <=1 WEEK        5 - 50     2 WEEKS       50 - 500     3 WEEKS       100 - 10,000     4 WEEKS     1,000 - 30,000        FEMALE AND NON-PREGNANT FEMALE:     LESS THAN 5 mIU/mL   Urinalysis, Routine w reflex microscopic     Status: Abnormal   Collection Time: 09/26/18  8:05 AM  Result Value Ref Range   Color, Urine YELLOW YELLOW   APPearance CLEAR CLEAR   Specific Gravity, Urine 1.015 1.005 - 1.030   pH 8.0 5.0 - 8.0   Glucose, UA NEGATIVE NEGATIVE mg/dL   Hgb urine dipstick SMALL (A) NEGATIVE   Bilirubin Urine NEGATIVE NEGATIVE   Ketones, ur NEGATIVE NEGATIVE mg/dL   Protein, ur NEGATIVE NEGATIVE mg/dL   Nitrite NEGATIVE NEGATIVE   Leukocytes,Ua MODERATE (A) NEGATIVE   RBC / HPF 6-10 0 - 5 RBC/hpf   WBC, UA 21-50 0 - 5 WBC/hpf   Bacteria, UA NONE SEEN NONE SEEN   Squamous Epithelial / LPF 0-5 0 - 5   Mucus PRESENT     Comment: Performed at [facility], [place] [address] [place],  [address]   US Abdomen Complete  Result Date: 09/26/2018 CLINICAL DATA:  Abdominal pain  radiating to the right flank since last night. EXAM: ABDOMEN ULTRASOUND COMPLETE COMPARISON:  None. FINDINGS: Examination limited by patient body habitus. Gallbladder: There is a nonmobile 13 mm shadowing gallstone in the gallbladder neck with sonographic Murphy's sign. No gallbladder wall  thickening or pericholecystic fluid. No gallbladder distention. Common bile duct: Diameter: 5 mm on limited views Liver: Liver parenchyma is diffusely mildly echogenic. No definite liver surface irregularity. No liver mass demonstrated, noting decreased sensitivity in the setting of an echogenic liver. Portal vein is patent on color Doppler imaging with normal direction of blood flow towards the liver. IVC: No abnormality visualized. Pancreas: Visualized portion unremarkable. Spleen: Size and appearance within normal limits. Right Kidney: Length: 11.0 cm. Normal right renal parenchymal echogenicity and thickness. No right hydronephrosis. Poorly visualized 2.9 x 2.5 x 3.5 cm upper right renal cyst with thin internal septation and lobulated outer contour. Separate simple 1.9 x 1.5 x 2.1 cm upper right renal cyst. Questionable 6 mm shadowing upper right renal stone. Left Kidney: Length: 11.6 cm. Echogenicity within normal limits. No mass or hydronephrosis visualized. Abdominal aorta: No aneurysm visualized. Other findings: None. IMPRESSION: 1. Cholelithiasis with nonmobile gallstone in the gallbladder neck. Sonographic Percell Miller sign is present. Findings suggest acute cholecystitis. 2. No biliary ductal dilatation. 3. Mildly echogenic liver parenchyma, a nonspecific finding most commonly due to diffuse hepatic steatosis. 4. Poorly visualized upper right renal cysts due to patient related limitations. Suggest either attention on follow-up renal sonogram in 6 months or further evaluation with short-term outpatient renal mass protocol MRI abdomen without and with IV contrast. 5. Questionable right nephrolithiasis.  No hydronephrosis. Electronically Signed   By: Ilona Sorrel M.D.   On: 09/26/2018 15:13    Pending Labs Unresulted Labs (From admission, onward)    Start     Ordered   09/27/18 0500  Comprehensive metabolic panel  Tomorrow morning,   R     09/26/18 1727   09/27/18 0500  CBC  Tomorrow morning,   R      09/26/18 1727   09/26/18 1724  HIV antibody (Routine Testing)  Once,   R     09/26/18 1727          Vitals/Pain Today's Vitals   09/26/18 1210 09/26/18 1215 09/26/18 1230 09/26/18 1737  BP:  (!) 176/72 (!) 176/73 (!) 181/96  Pulse:  68 71 85  Resp:    18  Temp:      TempSrc:      SpO2:  98% 97% 97%  PainSc: 7        Isolation Precautions No active isolations  Medications Medications  sodium chloride flush (NS) 0.9 % injection 3 mL (has no administration in time range)  enoxaparin (LOVENOX) injection 40 mg (has no administration in time range)  0.9 %  sodium chloride infusion (has no administration in time range)  ciprofloxacin (CIPRO) IVPB 400 mg (has no administration in time range)  acetaminophen (TYLENOL) tablet 1,000 mg (has no administration in time range)  traMADol (ULTRAM) tablet 50 mg (has no administration in time range)  ibuprofen (ADVIL,MOTRIN) tablet 600 mg (has no administration in time range)  oxyCODONE (Oxy IR/ROXICODONE) immediate release tablet 5-10 mg (has no administration in time range)  HYDROmorphone (DILAUDID) injection 0.5 mg (has no administration in time range)  methocarbamol (ROBAXIN) 500 mg in dextrose 5 % 50 mL IVPB (has no administration in time range)  diphenhydrAMINE (BENADRYL) capsule 25 mg (has no administration in time range)    Or  diphenhydrAMINE (BENADRYL) injection 25 mg (has no administration in time range)  zolpidem (AMBIEN) tablet 5 mg (has no administration in time range)  docusate sodium (COLACE) capsule 100 mg (has no administration in time range)  bisacodyl (DULCOLAX) suppository 10 mg (has no administration in time range)  ondansetron (ZOFRAN-ODT) disintegrating tablet 4 mg (has no administration in time range)    Or  ondansetron (ZOFRAN) injection 4 mg (has no administration in time range)  pantoprazole (PROTONIX) injection 40 mg (has no administration in time range)  metoprolol tartrate (LOPRESSOR) injection 5 mg (has no  administration in time range)  hydrALAZINE (APRESOLINE) injection 10 mg (has no administration in time range)  montelukast (SINGULAIR) tablet 10 mg (has no administration in time range)  cromolyn (NASALCROM) nasal spray 1 spray (has no administration in time range)  telmisartan-hydrochlorothiazide (MICARDIS HCT) 80-25 MG per tablet 1 tablet (has no administration in time range)  sodium chloride 0.9 % bolus 1,000 mL (0 mLs Intravenous Stopped 09/26/18 1441)  ondansetron (ZOFRAN) injection 4 mg (4 mg Intravenous Given 09/26/18 1002)  fentaNYL (SUBLIMAZE) injection 25 mcg (25 mcg Intravenous Given 09/26/18 1017)  morphine 2 MG/ML injection 2 mg (2 mg Intravenous Given 09/26/18 1314)  ondansetron (ZOFRAN) injection 4 mg (4 mg Intravenous Given 09/26/18 1505)  morphine 2 MG/ML injection 2 mg (2 mg Intravenous Given 09/26/18 1705)    Mobility walks Low fall risk   Focused Assessments    R Recommendations: See Admitting Provider Note  Report given to:   Additional Notes:

## 2018-09-26 NOTE — ED Notes (Signed)
Pt unable to void at this time. 

## 2018-09-26 NOTE — Progress Notes (Signed)
Patient admitted to [facility] room 23. Alert and oriented. Call bell in reach. Informed tocall for assistance to get OOB. Call bell in reach. Aware of plan for surgery in a.m. C/O constant  abdominal pain. Medicated prior to transfer to [facility]. Will continue to monitor.

## 2018-09-27 ENCOUNTER — Encounter (HOSPITAL_COMMUNITY): Payer: Self-pay

## 2018-09-27 ENCOUNTER — Observation Stay (HOSPITAL_COMMUNITY): Payer: BLUE CROSS/BLUE SHIELD | Admitting: Certified Registered Nurse Anesthetist

## 2018-09-27 ENCOUNTER — Other Ambulatory Visit: Payer: Self-pay

## 2018-09-27 ENCOUNTER — Encounter (HOSPITAL_COMMUNITY)
Admission: EM | Disposition: A | Discharge status: Home / Self Care | Payer: Self-pay | Source: Home / Self Care | Attending: Emergency Medicine

## 2018-09-27 HISTORY — PX: CHOLECYSTECTOMY: SHX55

## 2018-09-27 LAB — CBC
HCT: 37.8 % (ref 36.0–46.0)
Hemoglobin: 12.1 g/dL (ref 12.0–15.0)
MCH: 26.2 pg (ref 26.0–34.0)
MCHC: 32 g/dL (ref 30.0–36.0)
MCV: 82 fL (ref 80.0–100.0)
Platelets: 261 K/uL (ref 150–400)
RBC: 4.61 MIL/uL (ref 3.87–5.11)
RDW: 13.5 % (ref 11.5–15.5)
WBC: 11.1 K/uL — ABNORMAL HIGH (ref 4.0–10.5)
nRBC: 0 % (ref 0.0–0.2)

## 2018-09-27 LAB — COMPREHENSIVE METABOLIC PANEL
ALT: 11 U/L (ref 0–44)
AST: 15 U/L (ref 15–41)
Albumin: 3.2 g/dL — ABNORMAL LOW (ref 3.5–5.0)
Alkaline Phosphatase: 116 U/L (ref 38–126)
Anion gap: 11 (ref 5–15)
BUN: 5 mg/dL — ABNORMAL LOW (ref 6–20)
CO2: 25 mmol/L (ref 22–32)
Calcium: 8.6 mg/dL — ABNORMAL LOW (ref 8.9–10.3)
Chloride: 99 mmol/L (ref 98–111)
Creatinine, Ser: 0.71 mg/dL (ref 0.44–1.00)
GFR calc Af Amer: >60 mL/min (ref >60)
GFR calc non Af Amer: >60 mL/min (ref >60)
Glucose, Bld: 116 mg/dL — ABNORMAL HIGH (ref 70–99)
Potassium: 2.8 mmol/L — ABNORMAL LOW (ref 3.5–5.1)
Sodium: 135 mmol/L (ref 135–145)
Total Bilirubin: 0.9 mg/dL (ref 0.3–1.2)
Total Protein: 7.4 g/dL (ref 6.5–8.1)

## 2018-09-27 LAB — HIV ANTIBODY (ROUTINE TESTING W REFLEX): HIV Screen 4th Generation wRfx: NONREACTIVE

## 2018-09-27 LAB — SURGICAL PCR SCREEN
MRSA, PCR: NEGATIVE
Staphylococcus aureus: NEGATIVE

## 2018-09-27 SURGERY — LAPAROSCOPIC CHOLECYSTECTOMY WITH INTRAOPERATIVE CHOLANGIOGRAM
Anesthesia: General | Site: Abdomen

## 2018-09-27 MED ORDER — PHENYLEPHRINE 40 MCG/ML (10ML) SYRINGE FOR IV PUSH (FOR BLOOD PRESSURE SUPPORT)
PREFILLED_SYRINGE | INTRAVENOUS | Status: DC | PRN
  Administered 2018-09-27: 80 ug via INTRAVENOUS

## 2018-09-27 MED ORDER — LACTATED RINGERS IV SOLN
INTRAVENOUS | Status: DC
  Administered 2018-09-27 (×2): via INTRAVENOUS

## 2018-09-27 MED ORDER — HYDROMORPHONE HCL 1 MG/ML IJ SOLN: 0.2500 mg | INTRAMUSCULAR | Status: DC | PRN

## 2018-09-27 MED ORDER — MIDAZOLAM HCL 2 MG/2ML IJ SOLN
INTRAMUSCULAR | Status: AC
  Filled 2018-09-27: qty 2

## 2018-09-27 MED ORDER — PROPOFOL 10 MG/ML IV BOLUS
INTRAVENOUS | Status: AC
  Filled 2018-09-27: qty 20

## 2018-09-27 MED ORDER — ROCURONIUM BROMIDE 50 MG/5ML IV SOSY
PREFILLED_SYRINGE | INTRAVENOUS | Status: DC | PRN
  Administered 2018-09-27: 40 mg via INTRAVENOUS
  Administered 2018-09-27: 10 mg via INTRAVENOUS

## 2018-09-27 MED ORDER — SODIUM CHLORIDE 0.9 % IV SOLN
INTRAVENOUS | Status: DC
  Administered 2018-09-27 (×2): via INTRAVENOUS

## 2018-09-27 MED ORDER — HYDROMORPHONE HCL 1 MG/ML IJ SOLN: 0.5000 mg | INTRAMUSCULAR | Status: DC | PRN

## 2018-09-27 MED ORDER — ONDANSETRON HCL 4 MG/2ML IJ SOLN
4.0000 mg | Freq: Once | INTRAMUSCULAR | Status: AC
  Administered 2018-09-27: 4 mg via INTRAVENOUS

## 2018-09-27 MED ORDER — KETOROLAC TROMETHAMINE 30 MG/ML IJ SOLN: 30.0000 mg | Freq: Once | INTRAMUSCULAR | Status: DC | PRN

## 2018-09-27 MED ORDER — LACTATED RINGERS IV SOLN
INTRAVENOUS | Status: DC | PRN
  Administered 2018-09-27: 16:00:00 via INTRAVENOUS

## 2018-09-27 MED ORDER — BUPIVACAINE-EPINEPHRINE 0.25% -1:200000 IJ SOLN
INTRAMUSCULAR | Status: DC | PRN
  Administered 2018-09-27: 8 mL

## 2018-09-27 MED ORDER — DEXAMETHASONE SODIUM PHOSPHATE 10 MG/ML IJ SOLN
INTRAMUSCULAR | Status: AC
  Filled 2018-09-27: qty 1

## 2018-09-27 MED ORDER — MIDAZOLAM HCL 2 MG/2ML IJ SOLN
INTRAMUSCULAR | Status: DC | PRN
  Administered 2018-09-27: 2 mg via INTRAVENOUS

## 2018-09-27 MED ORDER — TRAMADOL HCL 50 MG PO TABS: 50.0000 mg | ORAL_TABLET | Freq: Four times a day (QID) | ORAL | Status: DC | PRN

## 2018-09-27 MED ORDER — SUCCINYLCHOLINE CHLORIDE 200 MG/10ML IV SOSY
PREFILLED_SYRINGE | INTRAVENOUS | Status: DC | PRN
  Administered 2018-09-27: 140 mg via INTRAVENOUS

## 2018-09-27 MED ORDER — FENTANYL CITRATE (PF) 250 MCG/5ML IJ SOLN
INTRAMUSCULAR | Status: AC
  Filled 2018-09-27: qty 5

## 2018-09-27 MED ORDER — ONDANSETRON HCL 4 MG/2ML IJ SOLN
INTRAMUSCULAR | Status: DC | PRN
  Administered 2018-09-27 (×2): 4 mg via INTRAVENOUS

## 2018-09-27 MED ORDER — ROCURONIUM BROMIDE 50 MG/5ML IV SOSY
PREFILLED_SYRINGE | INTRAVENOUS | Status: AC
  Filled 2018-09-27: qty 5

## 2018-09-27 MED ORDER — PROMETHAZINE HCL 25 MG/ML IJ SOLN: 6.2500 mg | INTRAMUSCULAR | Status: DC | PRN

## 2018-09-27 MED ORDER — MEPERIDINE HCL 50 MG/ML IJ SOLN: 6.2500 mg | INTRAMUSCULAR | Status: DC | PRN

## 2018-09-27 MED ORDER — LABETALOL HCL 5 MG/ML IV SOLN
INTRAVENOUS | Status: AC
  Filled 2018-09-27: qty 4

## 2018-09-27 MED ORDER — ONDANSETRON HCL 4 MG/2ML IJ SOLN
INTRAMUSCULAR | Status: AC
  Filled 2018-09-27: qty 2

## 2018-09-27 MED ORDER — PROPOFOL 10 MG/ML IV BOLUS
INTRAVENOUS | Status: DC | PRN
  Administered 2018-09-27: 200 mg via INTRAVENOUS

## 2018-09-27 MED ORDER — METHOCARBAMOL 500 MG PO TABS: 500.0000 mg | ORAL_TABLET | Freq: Three times a day (TID) | ORAL | Status: DC | PRN

## 2018-09-27 MED ORDER — LIDOCAINE 2% (20 MG/ML) 5 ML SYRINGE
INTRAMUSCULAR | Status: AC
  Filled 2018-09-27: qty 5

## 2018-09-27 MED ORDER — 0.9 % SODIUM CHLORIDE (POUR BTL) OPTIME
TOPICAL | Status: DC | PRN
  Administered 2018-09-27: 1000 mL

## 2018-09-27 MED ORDER — ENOXAPARIN SODIUM 40 MG/0.4ML ~~LOC~~ SOLN
40.0000 mg | SUBCUTANEOUS | Status: DC
  Filled 2018-09-27: qty 0.4

## 2018-09-27 MED ORDER — LIDOCAINE 2% (20 MG/ML) 5 ML SYRINGE
INTRAMUSCULAR | Status: DC | PRN
  Administered 2018-09-27: 100 mg via INTRAVENOUS

## 2018-09-27 MED ORDER — PHENYLEPHRINE 40 MCG/ML (10ML) SYRINGE FOR IV PUSH (FOR BLOOD PRESSURE SUPPORT)
PREFILLED_SYRINGE | INTRAVENOUS | Status: AC
  Filled 2018-09-27: qty 10

## 2018-09-27 MED ORDER — DEXAMETHASONE SODIUM PHOSPHATE 10 MG/ML IJ SOLN
INTRAMUSCULAR | Status: DC | PRN
  Administered 2018-09-27: 10 mg via INTRAVENOUS

## 2018-09-27 MED ORDER — OXYCODONE HCL 5 MG PO TABS: 5.0000 mg | ORAL_TABLET | ORAL | Status: DC | PRN

## 2018-09-27 MED ORDER — FENTANYL CITRATE (PF) 100 MCG/2ML IJ SOLN
INTRAMUSCULAR | Status: DC | PRN
  Administered 2018-09-27 (×5): 50 ug via INTRAVENOUS

## 2018-09-27 MED ORDER — SUCCINYLCHOLINE CHLORIDE 200 MG/10ML IV SOSY
PREFILLED_SYRINGE | INTRAVENOUS | Status: AC
  Filled 2018-09-27: qty 10

## 2018-09-27 MED ORDER — LABETALOL HCL 5 MG/ML IV SOLN
INTRAVENOUS | Status: DC | PRN
  Administered 2018-09-27: 10 mg via INTRAVENOUS

## 2018-09-27 MED ORDER — POTASSIUM CHLORIDE 10 MEQ/100ML IV SOLN
10.0000 meq | INTRAVENOUS | Status: AC
  Administered 2018-09-27 (×3): 10 meq via INTRAVENOUS
  Filled 2018-09-27 (×2): qty 100

## 2018-09-27 MED ORDER — SUGAMMADEX SODIUM 200 MG/2ML IV SOLN
INTRAVENOUS | Status: DC | PRN
  Administered 2018-09-27: 400 mg via INTRAVENOUS

## 2018-09-27 MED ORDER — BUPIVACAINE-EPINEPHRINE (PF) 0.25% -1:200000 IJ SOLN
INTRAMUSCULAR | Status: AC
  Filled 2018-09-27: qty 30

## 2018-09-27 SURGICAL SUPPLY — 44 items
APPLIER CLIP ROT 10 11.4 M/L (STAPLE) ×2
BLADE CLIPPER SURG (BLADE) IMPLANT
CANISTER SUCT 3000ML PPV (MISCELLANEOUS) ×2 IMPLANT
CHLORAPREP W/TINT 26ML (MISCELLANEOUS) ×2 IMPLANT
CLIP APPLIE ROT 10 11.4 M/L (STAPLE) ×1 IMPLANT
COVER MAYO STAND STRL (DRAPES) IMPLANT
COVER SURGICAL LIGHT HANDLE (MISCELLANEOUS) ×2 IMPLANT
COVER WAND RF STERILE (DRAPES) IMPLANT
DERMABOND ADVANCED (GAUZE/BANDAGES/DRESSINGS) ×1
DERMABOND ADVANCED .7 DNX12 (GAUZE/BANDAGES/DRESSINGS) ×1 IMPLANT
DRAPE C-ARM 42X72 X-RAY (DRAPES) IMPLANT
DRAPE WARM FLUID 44X44 (DRAPE) ×2 IMPLANT
ELECT REM PT RETURN 9FT ADLT (ELECTROSURGICAL) ×2
ELECTRODE REM PT RTRN 9FT ADLT (ELECTROSURGICAL) ×1 IMPLANT
GLOVE BIO SURGEON STRL SZ8 (GLOVE) ×2 IMPLANT
GLOVE BIOGEL PI IND STRL 8 (GLOVE) ×1 IMPLANT
GLOVE BIOGEL PI INDICATOR 8 (GLOVE) ×1
GLOVE ECLIPSE 6.5 STRL STRAW (GLOVE) ×4 IMPLANT
GLOVE INDICATOR 6.5 STRL GRN (GLOVE) ×4 IMPLANT
GOWN STRL REUS W/ TWL LRG LVL3 (GOWN DISPOSABLE) ×4 IMPLANT
GOWN STRL REUS W/ TWL XL LVL3 (GOWN DISPOSABLE) ×1 IMPLANT
GOWN STRL REUS W/TWL LRG LVL3 (GOWN DISPOSABLE) ×4
GOWN STRL REUS W/TWL XL LVL3 (GOWN DISPOSABLE) ×1
KIT BASIN OR (CUSTOM PROCEDURE TRAY) ×2 IMPLANT
KIT TURNOVER KIT B (KITS) ×2 IMPLANT
NS IRRIG 1000ML POUR BTL (IV SOLUTION) ×2 IMPLANT
PAD ARMBOARD 7.5X6 YLW CONV (MISCELLANEOUS) ×2 IMPLANT
POUCH RETRIEVAL ECOSAC 10 (ENDOMECHANICALS) ×1 IMPLANT
POUCH RETRIEVAL ECOSAC 10MM (ENDOMECHANICALS) ×1
SCISSORS LAP 5X35 DISP (ENDOMECHANICALS) ×2 IMPLANT
SET CHOLANGIOGRAPH 5 50 .035 (SET/KITS/TRAYS/PACK) IMPLANT
SET IRRIG TUBING LAPAROSCOPIC (IRRIGATION / IRRIGATOR) ×2 IMPLANT
SET TUBE SMOKE EVAC HIGH FLOW (TUBING) ×2 IMPLANT
SLEEVE ENDOPATH XCEL 5M (ENDOMECHANICALS) ×2 IMPLANT
SPECIMEN JAR SMALL (MISCELLANEOUS) ×2 IMPLANT
SUT MNCRL AB 4-0 PS2 18 (SUTURE) ×4 IMPLANT
SUT VICRYL 0 UR6 27IN ABS (SUTURE) ×4 IMPLANT
TOWEL OR 17X24 6PK STRL BLUE (TOWEL DISPOSABLE) ×2 IMPLANT
TOWEL OR 17X26 10 PK STRL BLUE (TOWEL DISPOSABLE) ×2 IMPLANT
TRAY LAPAROSCOPIC MC (CUSTOM PROCEDURE TRAY) ×2 IMPLANT
TROCAR XCEL BLUNT TIP 100MML (ENDOMECHANICALS) ×2 IMPLANT
TROCAR XCEL NON-BLD 11X100MML (ENDOMECHANICALS) ×2 IMPLANT
TROCAR XCEL NON-BLD 5MMX100MML (ENDOMECHANICALS) ×2 IMPLANT
WATER STERILE IRR 1000ML POUR (IV SOLUTION) ×2 IMPLANT

## 2018-09-27 NOTE — Discharge Instructions (Signed)
° ° °Managing Your Pain After Surgery Without Opioids ° ° ° °Thank you for participating in our program to help patients manage their pain after surgery without opioids. This is part of our effort to provide you with the best care possible, without exposing you or your family to the risk that opioids pose. ° °What pain can I expect after surgery? °You can expect to have some pain after surgery. This is normal. The pain is typically worse the day after surgery, and quickly begins to get better. °Many studies have found that many patients are able to manage their pain after surgery with Over-the-Counter (OTC) medications such as Tylenol and Motrin. If you have a condition that does not allow you to take Tylenol or Motrin, notify your surgical team. ° °How will I manage my pain? °The best strategy for controlling your pain after surgery is around the clock pain control with Tylenol (acetaminophen) and Motrin (ibuprofen or Advil). Alternating these medications with each other allows you to maximize your pain control. In addition to Tylenol and Motrin, you can use heating pads or ice packs on your incisions to help reduce your pain. ° °How will I alternate your regular strength over-the-counter pain medication? °You will take a dose of pain medication every three hours. °; Start by taking 650 mg of Tylenol (2 pills of 325 mg) °; 3 hours later take 600 mg of Motrin (3 pills of 200 mg) °; 3 hours after taking the Motrin take 650 mg of Tylenol °; 3 hours after that take 600 mg of Motrin. ° ° °- 1 - ° °See example - if your first dose of Tylenol is at 12:00 PM ° ° °12:00 PM Tylenol 650 mg (2 pills of 325 mg)  °3:00 PM Motrin 600 mg (3 pills of 200 mg)  °6:00 PM Tylenol 650 mg (2 pills of 325 mg)  °9:00 PM Motrin 600 mg (3 pills of 200 mg)  °Continue alternating every 3 hours  ° °We recommend that you follow this schedule around-the-clock for at least 3 days after surgery, or until you feel that it is no longer needed. Use  the table on the last page of this handout to keep track of the medications you are taking. °Important: °Do not take more than 3000mg of Tylenol or 3200mg of Motrin in a 24-hour period. °Do not take ibuprofen/Motrin if you have a history of bleeding stomach ulcers, severe kidney disease, &/or actively taking a blood thinner ° °What if I still have pain? °If you have pain that is not controlled with the over-the-counter pain medications (Tylenol and Motrin or Advil) you might have what we call “breakthrough” pain. You will receive a prescription for a small amount of an opioid pain medication such as Oxycodone, Tramadol, or Tylenol with Codeine. Use these opioid pills in the first 24 hours after surgery if you have breakthrough pain. Do not take more than 1 pill every 4-6 hours. ° °If you still have uncontrolled pain after using all opioid pills, don't hesitate to call our staff using the number provided. We will help make sure you are managing your pain in the best way possible, and if necessary, we can provide a prescription for additional pain medication. ° ° °Day 1   ° °Time  °Name of Medication Number of pills taken  °Amount of Acetaminophen  °Pain Level  ° °Comments  °AM PM       °AM PM       °AM PM       °  AM PM       °AM PM       °AM PM       °AM PM       °AM PM       °Total Daily amount of Acetaminophen °Do not take more than  3,000 mg per day    ° ° °Day 2   ° °Time  °Name of Medication Number of pills °taken  °Amount of Acetaminophen  °Pain Level  ° °Comments  °AM PM       °AM PM       °AM PM       °AM PM       °AM PM       °AM PM       °AM PM       °AM PM       °Total Daily amount of Acetaminophen °Do not take more than  3,000 mg per day    ° ° °Day 3   ° °Time  °Name of Medication Number of pills taken  °Amount of Acetaminophen  °Pain Level  ° °Comments  °AM PM       °AM PM       °AM PM       °AM PM       ° ° ° °AM PM       °AM PM       °AM PM       °AM PM       °Total Daily amount of Acetaminophen °Do  not take more than  3,000 mg per day    ° ° °Day 4   ° °Time  °Name of Medication Number of pills taken  °Amount of Acetaminophen  °Pain Level  ° °Comments  °AM PM       °AM PM       °AM PM       °AM PM       °AM PM       °AM PM       °AM PM       °AM PM       °Total Daily amount of Acetaminophen °Do not take more than  3,000 mg per day    ° ° °Day 5   ° °Time  °Name of Medication Number °of pills taken  °Amount of Acetaminophen  °Pain Level  ° °Comments  °AM PM       °AM PM       °AM PM       °AM PM       °AM PM       °AM PM       °AM PM       °AM PM       °Total Daily amount of Acetaminophen °Do not take more than  3,000 mg per day    ° ° ° °Day 6   ° °Time  °Name of Medication Number of pills °taken  °Amount of Acetaminophen  °Pain Level  °Comments  °AM PM       °AM PM       °AM PM       °AM PM       °AM PM       °AM PM       °AM PM       °AM PM       °Total Daily amount of Acetaminophen °Do not take more than    3,000 mg per day    ° ° °Day 7   ° °Time  °Name of Medication Number of pills taken  °Amount of Acetaminophen  °Pain Level  ° °Comments  °AM PM       °AM PM       °AM PM       °AM PM       °AM PM       °AM PM       °AM PM       °AM PM       °Total Daily amount of Acetaminophen °Do not take more than  3,000 mg per day    ° ° ° ° °For additional information about how and where to safely dispose of unused opioid °medications - https://www.morepowerfulnc.org ° °Disclaimer: This document contains information and/or instructional materials adapted from [facility] for the typical patient with your condition. It does not replace medical advice from your health care provider because your experience may differ from that of the °typical patient. Talk to your health care provider if you have any questions about this °document, your condition or your treatment plan. °Adapted from [facility] ° °[facility] °LAPAROSCOPIC SURGERY: POST OP INSTRUCTIONS °Always review your discharge  instruction sheet given to you by the facility where your surgery was performed. °IF YOU HAVE DISABILITY OR FAMILY LEAVE FORMS, YOU MUST BRING THEM TO THE OFFICE FOR PROCESSING.   °DO NOT GIVE THEM TO YOUR DOCTOR. ° °PAIN CONTROL ° °1. First take acetaminophen (Tylenol) AND/or ibuprofen (Advil) to control your pain after surgery.  Follow directions on package.  Taking acetaminophen (Tylenol) and/or ibuprofen (Advil) regularly after surgery will help to control your pain and lower the amount of prescription pain medication you may need.  You should not take more than 3,000 mg (3 grams) of acetaminophen (Tylenol) in 24 hours.  You should not take ibuprofen (Advil), aleve, motrin, naprosyn or other NSAIDS if you have a history of stomach ulcers or chronic kidney disease.  °2. A prescription for pain medication may be given to you upon discharge.  Take your pain medication as prescribed, if you still have uncontrolled pain after taking acetaminophen (Tylenol) or ibuprofen (Advil). °3. Use ice packs to help control pain. °4. If you need a refill on your pain medication, please contact your pharmacy.  They will contact our office to request authorization. Prescriptions will not be filled after 5pm or on week-ends. ° °HOME MEDICATIONS °5. Take your usually prescribed medications unless otherwise directed. ° °DIET °6. You should follow a light diet the first few days after arrival home.  Be sure to include lots of fluids daily. Avoid fatty, fried foods.  ° °CONSTIPATION °7. It is common to experience some constipation after surgery and if you are taking pain medication.  Increasing fluid intake and taking a stool softener (such as Colace) will usually help or prevent this problem from occurring.  A mild laxative (Milk of Magnesia or Miralax) should be taken according to package instructions if there are no bowel movements after 48 hours. ° °WOUND/INCISION CARE °8. Most patients will experience some swelling and bruising in  the area of the incisions.  Ice packs will help.  Swelling and bruising can take several days to resolve.  °9. Unless discharge instructions indicate otherwise, follow guidelines below  °a. STERI-STRIPS - you may remove your outer bandages 48 hours after surgery, and you may shower at that time.  You have steri-strips (  small skin tapes) in place directly over the incision.  These strips should be left on the skin for 7-10 days.   °b. DERMABOND/SKIN GLUE - you may shower in 24 hours.  The glue will flake off over the next 2-3 weeks. °10. Any sutures or staples will be removed at the office during your follow-up visit. ° °ACTIVITIES °11. You may resume regular (light) daily activities beginning the next day--such as daily self-care, walking, climbing stairs--gradually increasing activities as tolerated.  You may have sexual intercourse when it is comfortable.  Refrain from any heavy lifting or straining until approved by your doctor. °a. You may drive when you are no longer taking prescription pain medication, you can comfortably wear a seatbelt, and you can safely maneuver your car and apply brakes. ° °FOLLOW-UP °12. You should see your doctor in the office for a follow-up appointment approximately 2-3 weeks after your surgery.  You should have been given your post-op/follow-up appointment when your surgery was scheduled.  If you did not receive a post-op/follow-up appointment, make sure that you call for this appointment within a day or two after you arrive home to insure a convenient appointment time. ° ° °WHEN TO CALL YOUR DOCTOR: °1. Fever over 101.0 °2. Inability to urinate °3. Continued bleeding from incision. °4. Increased pain, redness, or drainage from the incision. °5. Increasing abdominal pain ° °The clinic staff is available to answer your questions during regular business hours.  Please don’t hesitate to call and ask to speak to one of the nurses for clinical concerns.  If you have a medical emergency,  go to the nearest emergency room or call 911.  A surgeon from [facility] is always on call at the hospital. °[address], [address], [place], [place]  [address] ? [address] [address], [place], [place]   [address] °[phone] ? [phone] ? FAX [phone] °Web site: [email or web address] ° °

## 2018-09-27 NOTE — Anesthesia Procedure Notes (Signed)
Procedure Name: Intubation Date/Time: 09/27/2018 4:07 PM Performed by: Genelle Bal, CRNA Pre-anesthesia Checklist: Patient identified, Emergency Drugs available, Suction available and Patient being monitored Patient Re-evaluated:Patient Re-evaluated prior to induction Oxygen Delivery Method: Circle system utilized Preoxygenation: Pre-oxygenation with 100% oxygen Induction Type: IV induction, Rapid sequence and Cricoid Pressure applied Laryngoscope Size: Miller and 2 Grade View: Grade I Tube type: Oral Tube size: 7.5 mm Number of attempts: 1 Airway Equipment and Method: Stylet Placement Confirmation: ETT inserted through vocal cords under direct vision,  positive ETCO2 and breath sounds checked- equal and bilateral Secured at: 20 cm Tube secured with: Tape Dental Injury: Teeth and Oropharynx as per pre-operative assessment

## 2018-09-27 NOTE — Transfer of Care (Signed)
Immediate Anesthesia Transfer of Care Note  Patient: [name]  Procedure(s) Performed: LAPAROSCOPIC CHOLECYSTECTOMY (N/A Abdomen)  Patient Location: PACU  Anesthesia Type:General  Level of Consciousness: awake, alert  and patient cooperative  Airway & Oxygen Therapy: Patient Spontanous Breathing  Post-op Assessment: Report given to RN and Post -op Vital signs reviewed and stable  Post vital signs: Reviewed and stable  Last Vitals:  Vitals Value Taken Time  BP 126/70 09/27/2018  5:30 PM  Temp 36.5 C 09/27/2018  5:30 PM  Pulse 93 09/27/2018  5:34 PM  Resp 24 09/27/2018  5:34 PM  SpO2 91 % 09/27/2018  5:34 PM  Vitals shown include unvalidated device data.  Last Pain:  Vitals:   09/27/18 1324  TempSrc: Oral  PainSc:       Patients Stated Pain Goal: 2 (00/51/10 2111)  Complications: No apparent anesthesia complications

## 2018-09-27 NOTE — Anesthesia Preprocedure Evaluation (Signed)
Anesthesia Evaluation  Patient identified by MRN, date of birth, ID band Patient awake    Reviewed: Allergy & Precautions, H&P , NPO status , Patient's Chart, lab work & pertinent test results  History of Anesthesia Complications (+) PROLONGED EMERGENCE  Airway Mallampati: III  TM Distance: >3 FB Neck ROM: full    Dental no notable dental hx. (+) Missing,    Pulmonary asthma (Rare inhaler use) , former smoker,    Pulmonary exam normal breath sounds clear to auscultation       Cardiovascular Exercise Tolerance: Good hypertension, Pt. on medications Normal cardiovascular exam Rhythm:regular Rate:Normal     Neuro/Psych  Headaches, negative psych ROS   GI/Hepatic Neg liver ROS, GERD  Medicated,  Endo/Other  Morbid obesity  Renal/GU negative Renal ROS  negative genitourinary   Musculoskeletal negative musculoskeletal ROS (+)   Abdominal (+) + obese,   Peds negative pediatric ROS (+)  Hematology negative hematology ROS (+)   Anesthesia Other Findings   Reproductive/Obstetrics negative OB ROS                             Anesthesia Physical  Anesthesia Plan  ASA: III  Anesthesia Plan: General   Post-op Pain Management:    Induction: Intravenous  PONV Risk Score and Plan: 4 or greater and Ondansetron, Dexamethasone, Scopolamine patch - Pre-op and Midazolam  Airway Management Planned: Oral ETT  Additional Equipment:   Intra-op Plan:   Post-operative Plan: Extubation in OR  Informed Consent: I have reviewed the patients History and Physical, chart, labs and discussed the procedure including the risks, benefits and alternatives for the proposed anesthesia with the patient or authorized representative who has indicated his/her understanding and acceptance.     Dental Advisory Given  Plan Discussed with: CRNA  Anesthesia Plan Comments: ( Discussed  general anesthesia, including  possible nausea, instrumentation of airway, sore throat,pulmonary aspiration, etc. I asked if the were any outstanding questions, or  concerns before we proceeded. )        Anesthesia Quick Evaluation

## 2018-09-27 NOTE — Op Note (Signed)
Laparoscopic Cholecystectomy  Procedure Note  Indications: This patient presents with symptomatic gallbladder disease and will undergo laparoscopic cholecystectomy.The procedure has been discussed with the patient. Operative and non operative treatments have been discussed. Risks of surgery include bleeding, infection,  Common bile duct injury,  Injury to the stomach,liver, colon,small intestine, abdominal wall,  Diaphragm,  Major blood vessels,  And the need for an open procedure.  Other risks include worsening of medical problems, death,  DVT and pulmonary embolism, and cardiovascular events.   Medical options have also been discussed. The patient has been informed of long term expectations of surgery and non surgical options,  The patient agrees to proceed.    Pre-operative Diagnosis: Calculus of gallbladder with acute cholecystitis, without mention of obstruction  Post-operative Diagnosis: Same  Surgeon: Joyice Faster Genie Mirabal   Assistants: [place] PA   Anesthesia: General endotracheal anesthesia and Local anesthesia 0.25.% bupivacaine, with epinephrine  ASA Class: 2  Procedure Details  The patient was seen again in the Holding Room. The risks, benefits, complications, treatment options, and expected outcomes were discussed with the patient. The possibilities of reaction to medication, pulmonary aspiration, perforation of viscus, bleeding, recurrent infection, finding a normal gallbladder, the need for additional procedures, failure to diagnose a condition, the possible need to convert to an open procedure, and creating a complication requiring transfusion or operation were discussed with the patient. The patient and/or family concurred with the proposed plan, giving informed consent. The site of surgery properly noted/marked. The patient was taken to Operating Room, identified as [name] and the procedure verified as Laparoscopic Cholecystectomy with Intraoperative Cholangiograms. A Time Out  was held and the above information confirmed.  Prior to the induction of general anesthesia, antibiotic prophylaxis was administered. General endotracheal anesthesia was then administered and tolerated well. After the induction, the abdomen was prepped in the usual sterile fashion. The patient was positioned in the supine position with the left arm comfortably tucked, along with some reverse Trendelenburg.  Local anesthetic agent was injected into the skin near the umbilicus and an incision made. The midline fascia was incised and the Hasson technique was used to introduce a 12 mm port under direct vision. It was secured with a figure of eight Vicryl suture placed in the usual fashion. Pneumoperitoneum was then created with CO2 and tolerated well without any adverse changes in the patient's vital signs. Additional trocars were introduced under direct vision with an 11 mm trocar in the epigastrium and 2 5 mm trocars in the right upper quadrant. All skin incisions were infiltrated with a local anesthetic agent before making the incision and placing the trocars.   The gallbladder was identified, the fundus grasped and retracted cephalad. Adhesions were lysed bluntly and with the electrocautery where indicated, taking care not to injure any adjacent organs or viscus. The infundibulum was grasped and retracted laterally, exposing the peritoneum overlying the triangle of Calot. This was then divided and exposed in a blunt fashion. The cystic duct was clearly identified and bluntly dissected circumferentially. The junctions of the gallbladder, cystic duct and common bile duct were clearly identified prior to the division of any linear structure.    The critical view was obtained. The cystic duct was extremely small so cholangiogram was not performed.   The cystic duct was then  ligated with surgical clips  on the patient side and  clipped on the gallbladder side and divided. The cystic artery was identified,  dissected free, ligated with clips and divided as  well. Posterior cystic artery clipped and divided.  The gallbladder was dissected from the liver bed in retrograde fashion with the electrocautery. The gallbladder was removed. The liver bed was irrigated and inspected. Hemostasis was achieved with the electrocautery. Copious irrigation was utilized and was repeatedly aspirated until clear all particulate matter. Hemostasis was achieved with no signs  Of bleeding or bile leakage.  Pneumoperitoneum was completely reduced after viewing removal of the trocars under direct vision. The wound was thoroughly irrigated and the fascia was then closed with a figure of eight suture; the skin was then closed with 4 0 MONOCRYL and a sterile dressing was applied.  Instrument, sponge, and needle counts were correct at closure and at the conclusion of the case.   Findings: Cholecystitis with Cholelithiasis  Estimated Blood Loss: less than 50 mL         Drains: NONE          Total IV Fluids: PER RECORD          Specimens: Gallbladder           Complications: None; patient tolerated the procedure well.         Disposition: PACU - hemodynamically stable.         Condition: stable

## 2018-09-27 NOTE — Interval H&P Note (Signed)
History and Physical Interval Note:  09/27/2018 3:07 PM  [name]  has presented today for surgery, with the diagnosis of acute cholecystitis.  The various methods of treatment have been discussed with the patient and family. After consideration of risks, benefits and other options for treatment, the patient has consented to  Procedure(s): LAPAROSCOPIC CHOLECYSTECTOMY WITH INTRAOPERATIVE CHOLANGIOGRAM (N/A) as a surgical intervention.  The patient's history has been reviewed, patient examined, no change in status, stable for surgery.  I have reviewed the patient's chart and labs.  Questions were answered to the patient's satisfaction.     [place]

## 2018-09-28 ENCOUNTER — Other Ambulatory Visit: Payer: Self-pay | Admitting: Internal Medicine

## 2018-09-28 ENCOUNTER — Encounter (HOSPITAL_COMMUNITY): Payer: Self-pay | Admitting: Surgery

## 2018-09-28 LAB — COMPREHENSIVE METABOLIC PANEL
ALT: 34 U/L (ref 0–44)
AST: 55 U/L — ABNORMAL HIGH (ref 15–41)
Albumin: 3.1 g/dL — ABNORMAL LOW (ref 3.5–5.0)
Alkaline Phosphatase: 105 U/L (ref 38–126)
Anion gap: 11 (ref 5–15)
BUN: 9 mg/dL (ref 6–20)
CO2: 23 mmol/L (ref 22–32)
Calcium: 9 mg/dL (ref 8.9–10.3)
Chloride: 103 mmol/L (ref 98–111)
Creatinine, Ser: 1.1 mg/dL — ABNORMAL HIGH (ref 0.44–1.00)
GFR calc Af Amer: >60 mL/min (ref >60)
GFR calc non Af Amer: 55 mL/min — ABNORMAL LOW (ref >60)
Glucose, Bld: 134 mg/dL — ABNORMAL HIGH (ref 70–99)
Potassium: 3.2 mmol/L — ABNORMAL LOW (ref 3.5–5.1)
Sodium: 137 mmol/L (ref 135–145)
Total Bilirubin: 0.7 mg/dL (ref 0.3–1.2)
Total Protein: 7.7 g/dL (ref 6.5–8.1)

## 2018-09-28 MED ORDER — TRAMADOL HCL 50 MG PO TABS: 50.0000 mg | ORAL_TABLET | Freq: Four times a day (QID) | ORAL | 0 refills | Status: DC | PRN

## 2018-09-28 MED ORDER — POTASSIUM CHLORIDE ER 10 MEQ PO TBCR: 10.0000 meq | EXTENDED_RELEASE_TABLET | Freq: Every day | ORAL | 0 refills | Status: DC

## 2018-09-28 MED ORDER — POTASSIUM CHLORIDE CRYS ER 20 MEQ PO TBCR
20.0000 meq | EXTENDED_RELEASE_TABLET | Freq: Once | ORAL | Status: AC
  Administered 2018-09-28: 20 meq via ORAL
  Filled 2018-09-28: qty 1

## 2018-09-28 NOTE — Discharge Summary (Signed)
[facility] Discharge Summary   Patient ID: [name] MRN: [id number] DOB/AGE: 12/28/1960 58 y.o.  Admit date: 09/26/2018 Discharge date: 09/28/2018  Admitting Diagnosis: Acute cholecystitis  Discharge Diagnosis Acute cholecystitis  Consultants None  Imaging: US Abdomen Complete  Result Date: 09/26/2018 CLINICAL DATA:  Abdominal pain radiating to the right flank since last night. EXAM: ABDOMEN ULTRASOUND COMPLETE COMPARISON:  None. FINDINGS: Examination limited by patient body habitus. Gallbladder: There is a nonmobile 13 mm shadowing gallstone in the gallbladder neck with sonographic Murphy's sign. No gallbladder wall thickening or pericholecystic fluid. No gallbladder distention. Common bile duct: Diameter: 5 mm on limited views Liver: Liver parenchyma is diffusely mildly echogenic. No definite liver surface irregularity. No liver mass demonstrated, noting decreased sensitivity in the setting of an echogenic liver. Portal vein is patent on color Doppler imaging with normal direction of blood flow towards the liver. IVC: No abnormality visualized. Pancreas: Visualized portion unremarkable. Spleen: Size and appearance within normal limits. Right Kidney: Length: 11.0 cm. Normal right renal parenchymal echogenicity and thickness. No right hydronephrosis. Poorly visualized 2.9 x 2.5 x 3.5 cm upper right renal cyst with thin internal septation and lobulated outer contour. Separate simple 1.9 x 1.5 x 2.1 cm upper right renal cyst. Questionable 6 mm shadowing upper right renal stone. Left Kidney: Length: 11.6 cm. Echogenicity within normal limits. No mass or hydronephrosis visualized. Abdominal aorta: No aneurysm visualized. Other findings: None. IMPRESSION: 1. Cholelithiasis with nonmobile gallstone in the gallbladder neck. Sonographic Percell Miller sign is present. Findings suggest acute cholecystitis. 2. No biliary ductal dilatation. 3. Mildly echogenic liver parenchyma, a nonspecific finding  most commonly due to diffuse hepatic steatosis. 4. Poorly visualized upper right renal cysts due to patient related limitations. Suggest either attention on follow-up renal sonogram in 6 months or further evaluation with short-term outpatient renal mass protocol MRI abdomen without and with IV contrast. 5. Questionable right nephrolithiasis.  No hydronephrosis. Electronically Signed   By: Ilona Sorrel M.D.   On: 09/26/2018 15:13    Procedures Dr. Brantley Stage (09/27/18) - Laparoscopic Cholecystectomy   Hospital Course:  Patient is a 58 year old female who presented to [facility] with RUQ pain.  Workup showed acute cholecystitis.  Patient was admitted and underwent procedure listed above.  Tolerated procedure well and was transferred to the floor.  Diet was advanced as tolerated.  On POD#1, the patient was voiding well, tolerating diet, ambulating well, pain well controlled, vital signs stable, incisions c/d/i and felt stable for discharge home.  Patient will follow up in our office in 2 weeks and knows to call with questions or concerns. She will call to confirm appointment date/time.    Physical Exam: General:  Alert, NAD, pleasant, comfortable Abd:  Soft, ND, mild tenderness, incisions C/D/I  Allergies as of 09/28/2018      Reactions   Penicillins Hives   Has patient had a PCN reaction causing immediate rash, facial/tongue/throat swelling, SOB or lightheadedness with hypotension: Yes Has patient had a PCN reaction causing severe rash involving mucus membranes or skin necrosis: Yes Has patient had a PCN reaction that required hospitalization: Yes Has patient had a PCN reaction occurring within the last 10 years: Yes If all of the above answers are "NO", then may proceed with Cephalosporin use.      Medication List    TAKE these medications   acetaminophen 500 MG tablet Commonly known as:  TYLENOL Take 1,000 mg by mouth every 6 (six) hours as needed for mild pain or  headache.   cromolyn 5.2 MG/ACT  nasal spray Commonly known as:  NasalCrom Place 1 spray into both nostrils 4 (four) times daily.   fluticasone 50 MCG/ACT nasal spray Commonly known as:  FLONASE Place 2 sprays into both nostrils daily as needed for allergies.   ibuprofen 200 MG tablet Commonly known as:  ADVIL,MOTRIN Take 400 mg by mouth every 6 (six) hours as needed for moderate pain.   levocetirizine 5 MG tablet Commonly known as:  XYZAL Take 5 mg by mouth every evening. Reported on 11/12/2015   montelukast 10 MG tablet Commonly known as:  SINGULAIR Take 10 mg by mouth every evening.   pantoprazole 40 MG tablet Commonly known as:  PROTONIX TAKE 1 TABLET BY MOUTH EVERY DAY   telmisartan-hydrochlorothiazide 80-25 MG tablet Commonly known as:  MICARDIS HCT TAKE 1 TABLET BY MOUTH EVERY DAY   traMADol 50 MG tablet Commonly known as:  ULTRAM Take 1 tablet (50 mg total) by mouth every 6 (six) hours as needed for severe pain.   triamcinolone cream 0.1 % Commonly known as:  KENALOG Apply 1 application topically 2 (two) times daily.   TURMERIC CURCUMIN PO Take 500 mg by mouth daily.        Follow-up Information    Sur[facility], [facility]. Go on 10/12/2018.   Specialty:  General Surgery Why:  Follow up appointment scheduled for 9:00 AM. Please arrive 30 min prior to appointment time. Bring photo ID and insurance information.  Contact information: [place] [address] [phone]           Signed: Brigid Re, [facility] 09/28/2018, 9:56 AM Pager: [phone] Consults: [phone]

## 2018-09-28 NOTE — Anesthesia Postprocedure Evaluation (Signed)
Anesthesia Post Note  Patient: [name]  Procedure(s) Performed: LAPAROSCOPIC CHOLECYSTECTOMY (N/A Abdomen)     Patient location during evaluation: PACU Anesthesia Type: General Level of consciousness: awake and alert Pain management: pain level controlled Vital Signs Assessment: post-procedure vital signs reviewed and stable Respiratory status: spontaneous breathing, nonlabored ventilation, respiratory function stable and patient connected to nasal cannula oxygen Cardiovascular status: blood pressure returned to baseline and stable Postop Assessment: no apparent nausea or vomiting Anesthetic complications: no    Last Vitals:  Vitals:   09/28/18 0148 09/28/18 0522  BP: (!) 142/67 (!) 152/72  Pulse: 92 74  Resp: 18 18  Temp: 37 C 37 C  SpO2: 95% 98%    Last Pain:  Vitals:   09/28/18 0620  TempSrc:   PainSc: Tyler Deis

## 2018-10-04 ENCOUNTER — Ambulatory Visit: Payer: Self-pay | Admitting: Internal Medicine

## 2018-10-04 NOTE — Telephone Encounter (Signed)
I was given potassium in the hospital when my gallbladder was removed 09/27/2018.   Just my fingers are cramping now but in the hospital the backs of my legs were cramping.   After they gave me the potassium the cramps eased off. I took my last potassium pill yesterday I was on 10 mEq.    Today my fingers are "locking up" or cramping.  See triage notes.  I sent a note to Dr. Sharlet Salina for further disposition.   Does she want pt to take OTC potassium or an Rx for it.   I let the pt know someone would be calling her back.   She was agreeable to this plan.   Reason for Disposition . [1] Request for URGENT new prescription or refill of "essential" medication (i.e., likelihood of harm to patient if not taken) AND [2] triager unable to fill per unit policy    Potassium for cramping.  Answer Assessment - Initial Assessment Questions 1. SYMPTOMS: "Do you have any symptoms?"     She had her gallbladder removed 09/27/2018.   Her potassium was low in the hospital.   She was having cramping in the back of her legs.   She took her last potassium 10 mEq 10/03/2018.    Today she is experiencing cramping in her fingers and they are "locking up".   She is wanting to know if she should take OTC potassium or if Dr. Sharlet Salina would prefer she take a prescription dose of potassium. 2. SEVERITY: If symptoms are present, ask "Are they mild, moderate or severe?"     Severe - fingers locking up from cramps.   Legs no longer cramping since receiving potassium in the hospital.  Protocols used: MEDICATION QUESTION CALL-A-AH

## 2018-10-05 NOTE — Telephone Encounter (Signed)
Can you schedule patient for a hospital follow up. Thank you

## 2018-10-05 NOTE — Telephone Encounter (Signed)
Do you want patient to get this rechecked in our office?

## 2018-10-05 NOTE — Telephone Encounter (Signed)
Should come for hospital follow up and labs can be done then or at the surgeon's office for her post op visit.

## 2018-10-05 NOTE — Telephone Encounter (Signed)
Should likely get this rechecked and she likely will not need this long term. In the meantime fresh fruits have a good amount of potassium and she should increase fruit intake.

## 2018-10-05 NOTE — Telephone Encounter (Signed)
Noted  

## 2018-10-05 NOTE — Telephone Encounter (Signed)
Appointment scheduled.

## 2018-10-07 ENCOUNTER — Other Ambulatory Visit (INDEPENDENT_AMBULATORY_CARE_PROVIDER_SITE_OTHER): Payer: BLUE CROSS/BLUE SHIELD

## 2018-10-07 ENCOUNTER — Other Ambulatory Visit: Payer: Self-pay

## 2018-10-07 ENCOUNTER — Encounter: Payer: Self-pay | Admitting: Internal Medicine

## 2018-10-07 ENCOUNTER — Ambulatory Visit: Payer: BLUE CROSS/BLUE SHIELD | Admitting: Internal Medicine

## 2018-10-07 VITALS — BP 130/80 | HR 85 | Temp 98.0°F | Ht 63.0 in | Wt 303.0 lb

## 2018-10-07 DIAGNOSIS — K819 Cholecystitis, unspecified: Secondary | ICD-10-CM

## 2018-10-07 LAB — CBC
HCT: 35.9 % — ABNORMAL LOW (ref 36.0–46.0)
Hemoglobin: 11.7 g/dL — ABNORMAL LOW (ref 12.0–15.0)
MCHC: 32.6 g/dL (ref 30.0–36.0)
MCV: 82.6 fl (ref 78.0–100.0)
Platelets: 282 10*3/uL (ref 150.0–400.0)
RBC: 4.35 Mil/uL (ref 3.87–5.11)
RDW: 14 % (ref 11.5–15.5)
WBC: 8.6 10*3/uL (ref 4.0–10.5)

## 2018-10-07 LAB — COMPREHENSIVE METABOLIC PANEL
ALT: 10 U/L (ref 0–35)
AST: 9 U/L (ref 0–37)
Albumin: 3.9 g/dL (ref 3.5–5.2)
Alkaline Phosphatase: 105 U/L (ref 39–117)
BUN: 12 mg/dL (ref 6–23)
CO2: 29 mEq/L (ref 19–32)
Calcium: 9.4 mg/dL (ref 8.4–10.5)
Chloride: 102 mEq/L (ref 96–112)
Creatinine, Ser: 0.96 mg/dL (ref 0.40–1.20)
GFR: 72.08 mL/min (ref >60.00)
Glucose, Bld: 101 mg/dL — ABNORMAL HIGH (ref 70–99)
Potassium: 3.6 mEq/L (ref 3.5–5.1)
Sodium: 140 mEq/L (ref 135–145)
Total Bilirubin: 0.3 mg/dL (ref 0.2–1.2)
Total Protein: 7.9 g/dL (ref 6.0–8.3)

## 2018-10-07 NOTE — Assessment & Plan Note (Signed)
Checking CBC and CMP post op. She is progressing well and no signs of wound infection. No diarrhea. Talked to her about dietary choices.

## 2018-10-07 NOTE — Patient Instructions (Signed)
We will check the labs today and call you back about the results.    

## 2018-10-07 NOTE — Progress Notes (Signed)
   Subjective:   Patient ID: [name], female    DOB: 1961-03-06, 58 y.o.   MRN: [id number]  HPI The patient is a 58 YO female coming in for hospital follow up (in for acute cholecystitis and surgery to remove gallbladder). Denies current abdominal pain. Some mild cramping and wonders if her potassium is still low. Denies fevers or chills. No drainage or pain at surgical sites. Denies nausea or vomiting. Not taking any narcotics. Denies constipation or diarrhea. Still following lifting instructions.   PMH, [facility], social history reviewed and updated.   Review of Systems  Constitutional: Negative.   HENT: Negative.   Eyes: Negative.   Respiratory: Negative for cough, chest tightness and shortness of breath.   Cardiovascular: Negative for chest pain, palpitations and leg swelling.  Gastrointestinal: Negative for abdominal distention, abdominal pain, constipation, diarrhea, nausea and vomiting.  Musculoskeletal: Negative.   Skin: Negative.   Neurological: Negative.   Psychiatric/Behavioral: Negative.     Objective:  Physical Exam Constitutional:      Appearance: She is well-developed. She is obese.  HENT:     Head: Normocephalic and atraumatic.  Neck:     Musculoskeletal: Normal range of motion.  Cardiovascular:     Rate and Rhythm: Normal rate and regular rhythm.  Pulmonary:     Effort: Pulmonary effort is normal. No respiratory distress.     Breath sounds: Normal breath sounds. No wheezing or rales.  Abdominal:     General: Bowel sounds are normal. There is no distension.     Palpations: Abdomen is soft.     Tenderness: There is no abdominal tenderness. There is no rebound.  Skin:    General: Skin is warm and dry.  Neurological:     Mental Status: She is alert and oriented to person, place, and time.     Coordination: Coordination normal.     Vitals:   10/07/18 1319  BP: 130/80  Pulse: 85  Temp: 98 F (36.7 C)  TempSrc: Oral  SpO2: 98%  Weight: (!) 303 lb (137.4  kg)  Height: 5\' 3"  (1.6 m)    Assessment & Plan:

## 2018-10-20 ENCOUNTER — Other Ambulatory Visit: Payer: Self-pay | Admitting: Internal Medicine

## 2019-01-12 ENCOUNTER — Other Ambulatory Visit: Payer: Self-pay | Admitting: Internal Medicine

## 2019-01-23 ENCOUNTER — Other Ambulatory Visit: Payer: Self-pay | Admitting: Internal Medicine

## 2019-04-05 ENCOUNTER — Other Ambulatory Visit: Payer: Self-pay | Admitting: Internal Medicine

## 2019-04-17 ENCOUNTER — Other Ambulatory Visit: Payer: Self-pay | Admitting: Internal Medicine

## 2019-07-01 ENCOUNTER — Other Ambulatory Visit: Payer: Self-pay | Admitting: Internal Medicine

## 2019-07-01 DIAGNOSIS — Z1231 Encounter for screening mammogram for malignant neoplasm of breast: Secondary | ICD-10-CM

## 2019-07-02 ENCOUNTER — Ambulatory Visit
Admission: RE | Admit: 2019-07-02 | Discharge: 2019-07-02 | Disposition: A | Discharge status: Home / Self Care | Payer: BLUE CROSS/BLUE SHIELD | Source: Ambulatory Visit | Attending: Internal Medicine | Admitting: Internal Medicine

## 2019-07-02 ENCOUNTER — Other Ambulatory Visit: Payer: Self-pay

## 2019-07-02 DIAGNOSIS — Z1231 Encounter for screening mammogram for malignant neoplasm of breast: Secondary | ICD-10-CM

## 2019-07-11 ENCOUNTER — Other Ambulatory Visit: Payer: Self-pay | Admitting: Internal Medicine

## 2019-08-25 ENCOUNTER — Ambulatory Visit (INDEPENDENT_AMBULATORY_CARE_PROVIDER_SITE_OTHER): Payer: Medicare Other | Admitting: Internal Medicine

## 2019-08-25 ENCOUNTER — Other Ambulatory Visit: Payer: Self-pay

## 2019-08-25 ENCOUNTER — Encounter: Payer: Self-pay | Admitting: Internal Medicine

## 2019-08-25 VITALS — BP 140/86 | HR 84 | Temp 99.5°F | Ht 63.0 in | Wt 307.0 lb

## 2019-08-25 DIAGNOSIS — Z0000 Encounter for general adult medical examination without abnormal findings: Secondary | ICD-10-CM

## 2019-08-25 DIAGNOSIS — J3089 Other allergic rhinitis: Secondary | ICD-10-CM | POA: Diagnosis not present

## 2019-08-25 DIAGNOSIS — I10 Essential (primary) hypertension: Secondary | ICD-10-CM

## 2019-08-25 DIAGNOSIS — K219 Gastro-esophageal reflux disease without esophagitis: Secondary | ICD-10-CM

## 2019-08-25 LAB — COMPREHENSIVE METABOLIC PANEL
ALT: 7 U/L (ref 0–35)
AST: 10 U/L (ref 0–37)
Albumin: 3.9 g/dL (ref 3.5–5.2)
Alkaline Phosphatase: 118 U/L — ABNORMAL HIGH (ref 39–117)
BUN: 13 mg/dL (ref 6–23)
CO2: 27 mEq/L (ref 19–32)
Calcium: 9.1 mg/dL (ref 8.4–10.5)
Chloride: 104 mEq/L (ref 96–112)
Creatinine, Ser: 0.75 mg/dL (ref 0.40–1.20)
GFR: 95.55 mL/min (ref >60.00)
Glucose, Bld: 93 mg/dL (ref 70–99)
Potassium: 3.6 mEq/L (ref 3.5–5.1)
Sodium: 139 mEq/L (ref 135–145)
Total Bilirubin: 0.4 mg/dL (ref 0.2–1.2)
Total Protein: 7.8 g/dL (ref 6.0–8.3)

## 2019-08-25 LAB — CBC
HCT: 37 % (ref 36.0–46.0)
Hemoglobin: 11.9 g/dL — ABNORMAL LOW (ref 12.0–15.0)
MCHC: 32.2 g/dL (ref 30.0–36.0)
MCV: 82.7 fl (ref 78.0–100.0)
Platelets: 235 10*3/uL (ref 150.0–400.0)
RBC: 4.47 Mil/uL (ref 3.87–5.11)
RDW: 13.8 % (ref 11.5–15.5)
WBC: 7.9 10*3/uL (ref 4.0–10.5)

## 2019-08-25 LAB — LIPID PANEL
Cholesterol: 109 mg/dL (ref 0–200)
HDL: 39.2 mg/dL (ref >39.00)
LDL Cholesterol: 48 mg/dL (ref 0–99)
NonHDL: 69.72
Total CHOL/HDL Ratio: 3
Triglycerides: 107 mg/dL (ref 0.0–149.0)
VLDL: 21.4 mg/dL (ref 0.0–40.0)

## 2019-08-25 LAB — HEMOGLOBIN A1C: Hgb A1c MFr Bld: 5.9 % (ref 4.6–6.5)

## 2019-08-25 NOTE — Assessment & Plan Note (Signed)
Taking protonix daily and well controlled.  

## 2019-08-25 NOTE — Patient Instructions (Signed)
Health Maintenance, Female Adopting a healthy lifestyle and getting preventive care are important in promoting health and wellness. Ask your health care provider about:  The right schedule for you to have regular tests and exams.  Things you can do on your own to prevent diseases and keep yourself healthy. What should I know about diet, weight, and exercise? Eat a healthy diet   Eat a diet that includes plenty of vegetables, fruits, low-fat dairy products, and lean protein.  Do not eat a lot of foods that are high in solid fats, added sugars, or sodium. Maintain a healthy weight Body mass index (BMI) is used to identify weight problems. It estimates body fat based on height and weight. Your health care provider can help determine your BMI and help you achieve or maintain a healthy weight. Get regular exercise Get regular exercise. This is one of the most important things you can do for your health. Most adults should:  Exercise for at least 150 minutes each week. The exercise should increase your heart rate and make you sweat (moderate-intensity exercise).  Do strengthening exercises at least twice a week. This is in addition to the moderate-intensity exercise.  Spend less time sitting. Even light physical activity can be beneficial. Watch cholesterol and blood lipids Have your blood tested for lipids and cholesterol at 60 years of age, then have this test every 5 years. Have your cholesterol levels checked more often if:  Your lipid or cholesterol levels are high.  You are older than 60 years of age.  You are at high risk for heart disease. What should I know about cancer screening? Depending on your health history and family history, you may need to have cancer screening at various ages. This may include screening for:  Breast cancer.  Cervical cancer.  Colorectal cancer.  Skin cancer.  Lung cancer. What should I know about heart disease, diabetes, and high blood  pressure? Blood pressure and heart disease  High blood pressure causes heart disease and increases the risk of stroke. This is more likely to develop in people who have high blood pressure readings, are of African descent, or are overweight.  Have your blood pressure checked: ? Every 3-5 years if you are 18-39 years of age. ? Every year if you are 40 years old or older. Diabetes Have regular diabetes screenings. This checks your fasting blood sugar level. Have the screening done:  Once every three years after age 40 if you are at a normal weight and have a low risk for diabetes.  More often and at a younger age if you are overweight or have a high risk for diabetes. What should I know about preventing infection? Hepatitis B If you have a higher risk for hepatitis B, you should be screened for this virus. Talk with your health care provider to find out if you are at risk for hepatitis B infection. Hepatitis C Testing is recommended for:  Everyone born from 1945 through 1965.  Anyone with known risk factors for hepatitis C. Sexually transmitted infections (STIs)  Get screened for STIs, including gonorrhea and chlamydia, if: ? You are sexually active and are younger than 60 years of age. ? You are older than 60 years of age and your health care provider tells you that you are at risk for this type of infection. ? Your sexual activity has changed since you were last screened, and you are at increased risk for chlamydia or gonorrhea. Ask your health care provider if   you are at risk.  Ask your health care provider about whether you are at high risk for HIV. Your health care provider may recommend a prescription medicine to help prevent HIV infection. If you choose to take medicine to prevent HIV, you should first get tested for HIV. You should then be tested every 3 months for as long as you are taking the medicine. Pregnancy  If you are about to stop having your period (premenopausal) and  you may become pregnant, seek counseling before you get pregnant.  Take 400 to 800 micrograms (mcg) of folic acid every day if you become pregnant.  Ask for birth control (contraception) if you want to prevent pregnancy. Osteoporosis and menopause Osteoporosis is a disease in which the bones lose minerals and strength with aging. This can result in bone fractures. If you are 65 years old or older, or if you are at risk for osteoporosis and fractures, ask your health care provider if you should:  Be screened for bone loss.  Take a calcium or vitamin D supplement to lower your risk of fractures.  Be given hormone replacement therapy (HRT) to treat symptoms of menopause. Follow these instructions at home: Lifestyle  Do not use any products that contain nicotine or tobacco, such as cigarettes, e-cigarettes, and chewing tobacco. If you need help quitting, ask your health care provider.  Do not use street drugs.  Do not share needles.  Ask your health care provider for help if you need support or information about quitting drugs. Alcohol use  Do not drink alcohol if: ? Your health care provider tells you not to drink. ? You are pregnant, may be pregnant, or are planning to become pregnant.  If you drink alcohol: ? Limit how much you use to 0-1 drink a day. ? Limit intake if you are breastfeeding.  Be aware of how much alcohol is in your drink. In the [place] one drink equals one 12 oz bottle of beer (355 mL), one 5 oz glass of wine (148 mL), or one 1 oz glass of hard liquor (44 mL). General instructions  Schedule regular health, dental, and eye exams.  Stay current with your vaccines.  Tell your health care provider if: ? You often feel depressed. ? You have ever been abused or do not feel safe at home. Summary  Adopting a healthy lifestyle and getting preventive care are important in promoting health and wellness.  Follow your health care provider's instructions about healthy  diet, exercising, and getting tested or screened for diseases.  Follow your health care provider's instructions on monitoring your cholesterol and blood pressure. This information is not intended to replace advice given to you by your health care provider. Make sure you discuss any questions you have with your health care provider. Document Revised: 06/30/2018 Document Reviewed: 06/30/2018 Elsevier Patient Education  2020 [employer] Inc.  

## 2019-08-25 NOTE — Assessment & Plan Note (Signed)
Takes singulair daily, cromolyn, flonase, xyzal.

## 2019-08-25 NOTE — Progress Notes (Signed)
   Subjective:   Patient ID: [name], female    DOB: 1961-07-20, 59 y.o.   MRN: [id number]  HPI The patient is a 59 YO female coming in for physical.  PMH, [facility], social history reviewed and updated.   Review of Systems  Constitutional: Negative.   HENT: Negative.   Eyes: Negative.   Respiratory: Negative for cough, chest tightness and shortness of breath.   Cardiovascular: Negative for chest pain, palpitations and leg swelling.  Gastrointestinal: Negative for abdominal distention, abdominal pain, constipation, diarrhea, nausea and vomiting.  Musculoskeletal: Negative.   Skin: Negative.   Neurological: Negative.   Psychiatric/Behavioral: Negative.     Objective:  Physical Exam Constitutional:      Appearance: She is well-developed. She is obese.  HENT:     Head: Normocephalic and atraumatic.  Cardiovascular:     Rate and Rhythm: Normal rate and regular rhythm.  Pulmonary:     Effort: Pulmonary effort is normal. No respiratory distress.     Breath sounds: Normal breath sounds. No wheezing or rales.  Abdominal:     General: Bowel sounds are normal. There is no distension.     Palpations: Abdomen is soft.     Tenderness: There is no abdominal tenderness. There is no rebound.  Musculoskeletal:     Cervical back: Normal range of motion.  Skin:    General: Skin is warm and dry.  Neurological:     Mental Status: She is alert and oriented to person, place, and time.     Coordination: Coordination normal.     Vitals:   08/25/19 0859  BP: (!) 144/92  Pulse: 84  Temp: 99.5 F (37.5 C)  TempSrc: Oral  SpO2: 98%  Weight: (!) 307 lb (139.3 kg)  Height: 5\' 3"  (1.6 m)    This visit occurred during the SARS-CoV-2 public health emergency.  Safety protocols were in place, including screening questions prior to the visit, additional usage of staff PPE, and extensive cleaning of exam room while observing appropriate contact time as indicated for disinfecting solutions.    Assessment & Plan:

## 2019-08-25 NOTE — Assessment & Plan Note (Signed)
BP mildly high, recheck normal. Taking telmisartan/hctz 80/25 mg daily. Checking CMP and adjust as needed.

## 2019-08-25 NOTE — Assessment & Plan Note (Signed)
Flu shot up to date. Shingrix complete. Tetanus up to date. Colonoscopy up to date. Mammogram up to date, pap smear up to date. Counseled about sun safety and mole surveillance. Counseled about the dangers of distracted driving. Given 10 year screening recommendations.   

## 2019-08-25 NOTE — Assessment & Plan Note (Signed)
Complicated by HTN. She is working on weight loss but without being able to go to gym this has been limited.

## 2019-09-28 ENCOUNTER — Other Ambulatory Visit: Payer: Self-pay | Admitting: Internal Medicine

## 2019-10-03 ENCOUNTER — Other Ambulatory Visit: Payer: Self-pay | Admitting: Internal Medicine

## 2019-10-17 ENCOUNTER — Telehealth: Payer: Self-pay

## 2019-10-17 NOTE — Telephone Encounter (Signed)
New message   The patient called wanted MD to know she tested positive for COVID on 3.28.21

## 2019-10-17 NOTE — Telephone Encounter (Signed)
Noted  

## 2019-12-26 ENCOUNTER — Other Ambulatory Visit: Payer: Self-pay | Admitting: Internal Medicine

## 2020-01-30 ENCOUNTER — Other Ambulatory Visit: Payer: Self-pay

## 2020-01-30 ENCOUNTER — Encounter: Payer: Self-pay | Admitting: Internal Medicine

## 2020-01-30 ENCOUNTER — Ambulatory Visit (INDEPENDENT_AMBULATORY_CARE_PROVIDER_SITE_OTHER): Payer: Medicare Other | Admitting: Internal Medicine

## 2020-01-30 VITALS — BP 134/86 | HR 76 | Temp 98.0°F | Ht 63.0 in | Wt 311.0 lb

## 2020-01-30 DIAGNOSIS — G8929 Other chronic pain: Secondary | ICD-10-CM | POA: Insufficient documentation

## 2020-01-30 DIAGNOSIS — M25512 Pain in left shoulder: Secondary | ICD-10-CM | POA: Diagnosis not present

## 2020-01-30 MED ORDER — METHYLPREDNISOLONE ACETATE 40 MG/ML IJ SUSP
40.0000 mg | Freq: Once | INTRAMUSCULAR | Status: AC
  Administered 2020-01-30: 40 mg via INTRAMUSCULAR

## 2020-01-30 MED ORDER — TRIAMCINOLONE ACETONIDE 0.1 % EX CREA: TOPICAL_CREAM | CUTANEOUS | 3 refills | Status: DC

## 2020-01-30 NOTE — Patient Instructions (Signed)
We have given you the steroid shot today and will get you in with sports medicine in a couple of weeks in case this does not help.   Shoulder Exercises Ask your health care provider which exercises are safe for you. Do exercises exactly as told by your health care provider and adjust them as directed. It is normal to feel mild stretching, pulling, tightness, or discomfort as you do these exercises. Stop right away if you feel sudden pain or your pain gets worse. Do not begin these exercises until told by your health care provider. Stretching exercises External rotation and abduction This exercise is sometimes called corner stretch. This exercise rotates your arm outward (external rotation) and moves your arm out from your body (abduction). 1. Stand in a doorway with one of your feet slightly in front of the other. This is called a staggered stance. If you cannot reach your forearms to the door frame, stand facing a corner of a room. 2. Choose one of the following positions as told by your health care provider: ? Place your hands and forearms on the door frame above your head. ? Place your hands and forearms on the door frame at the height of your head. ? Place your hands on the door frame at the height of your elbows. 3. Slowly move your weight onto your front foot until you feel a stretch across your chest and in the front of your shoulders. Keep your head and chest upright and keep your abdominal muscles tight. 4. Hold for __________ seconds. 5. To release the stretch, shift your weight to your back foot. Repeat __________ times. Complete this exercise __________ times a day. Extension, standing 1. Stand and hold a broomstick, a cane, or a similar object behind your back. ? Your hands should be a little wider than shoulder width apart. ? Your palms should face away from your back. 2. Keeping your elbows straight and your shoulder muscles relaxed, move the stick away from your body until you  feel a stretch in your shoulders (extension). ? Avoid shrugging your shoulders while you move the stick. Keep your shoulder blades tucked down toward the middle of your back. 3. Hold for __________ seconds. 4. Slowly return to the starting position. Repeat __________ times. Complete this exercise __________ times a day. Range-of-motion exercises Pendulum  1. Stand near a wall or a surface that you can hold onto for balance. 2. Bend at the waist and let your left / right arm hang straight down. Use your other arm to support you. Keep your back straight and do not lock your knees. 3. Relax your left / right arm and shoulder muscles, and move your hips and your trunk so your left / right arm swings freely. Your arm should swing because of the motion of your body, not because you are using your arm or shoulder muscles. 4. Keep moving your hips and trunk so your arm swings in the following directions, as told by your health care provider: ? Side to side. ? Forward and backward. ? In clockwise and counterclockwise circles. 5. Continue each motion for __________ seconds, or for as long as told by your health care provider. 6. Slowly return to the starting position. Repeat __________ times. Complete this exercise __________ times a day. Shoulder flexion, standing  1. Stand and hold a broomstick, a cane, or a similar object. Place your hands a little more than shoulder width apart on the object. Your left / right hand should be  palm up, and your other hand should be palm down. 2. Keep your elbow straight and your shoulder muscles relaxed. Push the stick up with your healthy arm to raise your left / right arm in front of your body, and then over your head until you feel a stretch in your shoulder (flexion). ? Avoid shrugging your shoulder while you raise your arm. Keep your shoulder blade tucked down toward the middle of your back. 3. Hold for __________ seconds. 4. Slowly return to the starting  position. Repeat __________ times. Complete this exercise __________ times a day. Shoulder abduction, standing 1. Stand and hold a broomstick, a cane, or a similar object. Place your hands a little more than shoulder width apart on the object. Your left / right hand should be palm up, and your other hand should be palm down. 2. Keep your elbow straight and your shoulder muscles relaxed. Push the object across your body toward your left / right side. Raise your left / right arm to the side of your body (abduction) until you feel a stretch in your shoulder. ? Do not raise your arm above shoulder height unless your health care provider tells you to do that. ? If directed, raise your arm over your head. ? Avoid shrugging your shoulder while you raise your arm. Keep your shoulder blade tucked down toward the middle of your back. 3. Hold for __________ seconds. 4. Slowly return to the starting position. Repeat __________ times. Complete this exercise __________ times a day. Internal rotation  1. Place your left / right hand behind your back, palm up. 2. Use your other hand to dangle an exercise band, a towel, or a similar object over your shoulder. Grasp the band with your left / right hand so you are holding on to both ends. 3. Gently pull up on the band until you feel a stretch in the front of your left / right shoulder. The movement of your arm toward the center of your body is called internal rotation. ? Avoid shrugging your shoulder while you raise your arm. Keep your shoulder blade tucked down toward the middle of your back. 4. Hold for __________ seconds. 5. Release the stretch by letting go of the band and lowering your hands. Repeat __________ times. Complete this exercise __________ times a day. Strengthening exercises External rotation  1. Sit in a stable chair without armrests. 2. Secure an exercise band to a stable object at elbow height on your left / right side. 3. Place a soft  object, such as a folded towel or a small pillow, between your left / right upper arm and your body to move your elbow about 4 inches (10 cm) away from your side. 4. Hold the end of the exercise band so it is tight and there is no slack. 5. Keeping your elbow pressed against the soft object, slowly move your forearm out, away from your abdomen (external rotation). Keep your body steady so only your forearm moves. 6. Hold for __________ seconds. 7. Slowly return to the starting position. Repeat __________ times. Complete this exercise __________ times a day. Shoulder abduction  1. Sit in a stable chair without armrests, or stand up. 2. Hold a __________ weight in your left / right hand, or hold an exercise band with both hands. 3. Start with your arms straight down and your left / right palm facing in, toward your body. 4. Slowly lift your left / right hand out to your side (abduction). Do not  lift your hand above shoulder height unless your health care provider tells you that this is safe. ? Keep your arms straight. ? Avoid shrugging your shoulder while you do this movement. Keep your shoulder blade tucked down toward the middle of your back. 5. Hold for __________ seconds. 6. Slowly lower your arm, and return to the starting position. Repeat __________ times. Complete this exercise __________ times a day. Shoulder extension 1. Sit in a stable chair without armrests, or stand up. 2. Secure an exercise band to a stable object in front of you so it is at shoulder height. 3. Hold one end of the exercise band in each hand. Your palms should face each other. 4. Straighten your elbows and lift your hands up to shoulder height. 5. Step back, away from the secured end of the exercise band, until the band is tight and there is no slack. 6. Squeeze your shoulder blades together as you pull your hands down to the sides of your thighs (extension). Stop when your hands are straight down by your sides. Do  not let your hands go behind your body. 7. Hold for __________ seconds. 8. Slowly return to the starting position. Repeat __________ times. Complete this exercise __________ times a day. Shoulder row 1. Sit in a stable chair without armrests, or stand up. 2. Secure an exercise band to a stable object in front of you so it is at waist height. 3. Hold one end of the exercise band in each hand. Position your palms so that your thumbs are facing the ceiling (neutral position). 4. Bend each of your elbows to a 90-degree angle (right angle) and keep your upper arms at your sides. 5. Step back until the band is tight and there is no slack. 6. Slowly pull your elbows back behind you. 7. Hold for __________ seconds. 8. Slowly return to the starting position. Repeat __________ times. Complete this exercise __________ times a day. Shoulder press-ups  1. Sit in a stable chair that has armrests. Sit upright, with your feet flat on the floor. 2. Put your hands on the armrests so your elbows are bent and your fingers are pointing forward. Your hands should be about even with the sides of your body. 3. Push down on the armrests and use your arms to lift yourself off the chair. Straighten your elbows and lift yourself up as much as you comfortably can. ? Move your shoulder blades down, and avoid letting your shoulders move up toward your ears. ? Keep your feet on the ground. As you get stronger, your feet should support less of your body weight as you lift yourself up. 4. Hold for __________ seconds. 5. Slowly lower yourself back into the chair. Repeat __________ times. Complete this exercise __________ times a day. Wall push-ups  1. Stand so you are facing a stable wall. Your feet should be about one arm-length away from the wall. 2. Lean forward and place your palms on the wall at shoulder height. 3. Keep your feet flat on the floor as you bend your elbows and lean forward toward the wall. 4. Hold for  __________ seconds. 5. Straighten your elbows to push yourself back to the starting position. Repeat __________ times. Complete this exercise __________ times a day. This information is not intended to replace advice given to you by your health care provider. Make sure you discuss any questions you have with your health care provider. Document Revised: 10/29/2018 Document Reviewed: 08/06/2018 Elsevier Patient Education  2020  [employer].

## 2020-01-30 NOTE — Assessment & Plan Note (Signed)
Can continue aleve for pain. Given depo-medrol 40 mg IM today and referral to sports medicine to be seen in 2-3 weeks in case no improvement.

## 2020-01-30 NOTE — Progress Notes (Signed)
   Subjective:   Patient ID: [name], female    DOB: 1960/08/12, 59 y.o.   MRN: [id number]  HPI The patient is a 59 YO female coming in for concerns about left upper arm pain and left shoulder pain. Started several months ago exact date unknown. No clear trigger or injury at onset. She was thinking it would go away but has worsened. Her ROM has gradually reduced over that time. Denies fevers or chills. Denies swelling in the shoulder but feels her arm is overall slightly swollen compared to right. Denies numbness or weakness. Cannot lift much due to pain. Taking aleve BID with good relief from that. Overall no significant change in daily activities.   Review of Systems  Constitutional: Negative.   HENT: Negative.   Eyes: Negative.   Respiratory: Negative for cough, chest tightness and shortness of breath.   Cardiovascular: Negative for chest pain, palpitations and leg swelling.  Gastrointestinal: Negative for abdominal distention, abdominal pain, constipation, diarrhea, nausea and vomiting.  Musculoskeletal: Positive for myalgias.  Skin: Negative.   Neurological: Negative.   Psychiatric/Behavioral: Negative.     Objective:  Physical Exam Constitutional:      Appearance: She is well-developed.  HENT:     Head: Normocephalic and atraumatic.  Cardiovascular:     Rate and Rhythm: Normal rate and regular rhythm.  Pulmonary:     Effort: Pulmonary effort is normal. No respiratory distress.     Breath sounds: Normal breath sounds. No wheezing or rales.  Abdominal:     General: Bowel sounds are normal. There is no distension.     Palpations: Abdomen is soft.     Tenderness: There is no abdominal tenderness. There is no rebound.  Musculoskeletal:        General: Tenderness present.     Cervical back: Normal range of motion.     Comments: Pain at left shoulder and upper arm, some reduction of ROM due to pain  Skin:    General: Skin is warm and dry.  Neurological:     Mental  Status: She is alert and oriented to person, place, and time.     Coordination: Coordination normal.     Vitals:   01/30/20 0949  BP: 134/86  Pulse: 76  Temp: 98 F (36.7 C)  TempSrc: Oral  SpO2: 99%  Weight: (!) 311 lb (141.1 kg)  Height: 5\' 3"  (1.6 m)    This visit occurred during the SARS-CoV-2 public health emergency.  Safety protocols were in place, including screening questions prior to the visit, additional usage of staff PPE, and extensive cleaning of exam room while observing appropriate contact time as indicated for disinfecting solutions.   Assessment & Plan:  Depo-medrol 40 mg IM given at visit

## 2020-02-20 ENCOUNTER — Ambulatory Visit: Payer: Self-pay

## 2020-02-20 ENCOUNTER — Other Ambulatory Visit: Payer: Self-pay

## 2020-02-20 ENCOUNTER — Encounter: Payer: Self-pay | Admitting: Family Medicine

## 2020-02-20 ENCOUNTER — Ambulatory Visit: Payer: Medicare Other | Admitting: Family Medicine

## 2020-02-20 VITALS — BP 178/120 | HR 98 | Ht 63.0 in | Wt 311.0 lb

## 2020-02-20 DIAGNOSIS — G8929 Other chronic pain: Secondary | ICD-10-CM

## 2020-02-20 DIAGNOSIS — M25512 Pain in left shoulder: Secondary | ICD-10-CM

## 2020-02-20 NOTE — Progress Notes (Signed)
Subjective:    CC: L shoulder pain  I, Molly Weber, LAT, ATC, am serving as scribe for Dr. [name].  HPI: Pt is a 59 y/o female presenting w/ c/o L shoulder pain x 2 years that has worsened over the last several months w/ no known MOI.  She locates her pain to her L upper arm.  She also reports L UE and L LE swelling.  Radiating pain: yes into her L forearm/wrist L shoulder mechanical symptoms: yes Neck pain: yes L UE numbness/tingling: yes in the forearm Aggravating factors: L shoulder AROM past about 60 deg;  Treatments tried: Aleve; IBU  Pertinent review of Systems: No fevers or chills  Relevant historical information: Hypertension obesity   Objective:    Vitals:   02/20/20 1041  BP: (!) 178/120  Pulse: 98  SpO2: 98%   General: Well Developed, well nourished, and in no acute distress.   MSK: C-spine normal-appearing normal motion.  Upper extremity strength is intact except listed below. Reflexes intact bilaterally. Sensation is intact throughout. Negative Spurling's test  Left shoulder normal-appearing nontender. Range of motion abduction limited 100 degrees.  Normal external and internal rotation. Strength 4/5 to abduction.  5/5 external and internal rotation. Positive Hawkins and Neer's test positive empty can test negative Yergason's and speeds test.  Contralateral right shoulder normal-appearing nontender normal motion normal strength negative impingement testing.  Lab and Radiology Results  Diagnostic Limited MSK Ultrasound of: Left shoulder.  Quality of ultrasound degraded by body habitus Biceps tendon intact in bicipital groove.  Hypoechoic fluid surrounding tendon and tendon sheath. Subscapularis tendon visible without obvious tear. Supraspinatus tendon is intact with hypoechoic change distal bursal surface.  Possible partial-thickness tear visible. Significant increase subacromial bursa thickness. Infraspinatus tendon is intact. AC joint  degenerative with effusion Impression: Subacromial bursitis.  Possible bursal surface partial supraspinatus tear.  Biceps tenosynovitis.  Of note quality of ultrasound imaging limited by body habitus  Procedure: Real-time Ultrasound Guided Injection of right subacromial bursa  Device: Philips Affiniti 50G Images permanently stored and available for review in the ultrasound unit. Verbal informed consent obtained.  Discussed risks and benefits of procedure. Warned about infection bleeding damage to structures skin hypopigmentation and fat atrophy among others. Patient expresses understanding and agreement Time-out conducted.   Noted no overlying erythema, induration, or other signs of local infection.   Skin prepped in a sterile fashion.   Local anesthesia: Topical Ethyl chloride.   With sterile technique and under real time ultrasound guidance:  40mg  Kenalog and 2 mL of Marcaine injected easily.   Completed without difficulty   Pain immediately resolved suggesting accurate placement of the medication.   Advised to call if fevers/chills, erythema, induration, drainage, or persistent bleeding.   Images permanently stored and available for review in the ultrasound unit.  Impression: Technically successful ultrasound guided injection.        Impression and Recommendations:    Assessment and Plan: 59 y.o. female with right shoulder pain failing initial conservative management.  Patient had significant improvement with subacromial injection.  Plan for physical therapy and injection as above.  Recheck in 6 weeks.  Patient may also have a component of C8 radiculopathy.  However Spurling's test was negative.  Main issue is bursitis as above.   Orders Placed This Encounter  Procedures  . [place] LIMITED JOINT SPACE STRUCTURES UP LEFT(NO LINKED CHARGES)    Order Specific Question:   Reason for Exam (SYMPTOM  OR DIAGNOSIS REQUIRED)  Answer:   L shoulder pain    Order Specific Question:    Preferred imaging location?    Answer:   [place]  . Ambulatory referral to Physical Therapy    Referral Priority:   Routine    Referral Type:   Physical Medicine    Referral Reason:   Specialty Services Required    Requested Specialty:   Physical Therapy   No orders of the defined types were placed in this encounter.   Discussed warning signs or symptoms. Please see discharge instructions. Patient expresses understanding.   The above documentation has been reviewed and is accurate and complete [name], M.D.

## 2020-02-20 NOTE — Patient Instructions (Signed)
Thank you for coming in today. Call or go to the ER if you develop a large red swollen joint with extreme pain or oozing puss.  Plan for PT.  Recheck in 6 weeks.  Return sooner if needed.    Shoulder Impingement Syndrome Rehab Ask your health care provider which exercises are safe for you. Do exercises exactly as told by your health care provider and adjust them as directed. It is normal to feel mild stretching, pulling, tightness, or discomfort as you do these exercises. Stop right away if you feel sudden pain or your pain gets worse. Do not begin these exercises until told by your health care provider. Stretching and range-of-motion exercise This exercise warms up your muscles and joints and improves the movement and flexibility of your shoulder. This exercise also helps to relieve pain and stiffness. Passive horizontal adduction In passive adduction, you use your other hand to move the injured arm toward your body. The injured arm does not move on its own. In this movement, your arm is moved across your body in the horizontal plane (horizontal adduction). 1. Sit or stand and pull your left / right elbow across your chest, toward your other shoulder. Stop when you feel a gentle stretch in the back of your shoulder and upper arm. ? Keep your arm at shoulder height. ? Keep your arm as close to your body as you comfortably can. 2. Hold for __________ seconds. 3. Slowly return to the starting position. Repeat __________ times. Complete this exercise __________ times a day. Strengthening exercises These exercises build strength and endurance in your shoulder. Endurance is the ability to use your muscles for a long time, even after they get tired. External rotation, isometric This is an exercise in which you press the back of your wrist against a door frame without moving your shoulder joint (isometric). 1. Stand or sit in a doorway, facing the door frame. 2. Bend your left / right elbow and  place the back of your wrist against the door frame. Only the back of your wrist should be touching the frame. Keep your upper arm at your side. 3. Gently press your wrist against the door frame, as if you are trying to push your arm away from your abdomen (external rotation). Press as hard as you are able without pain. ? Avoid shrugging your shoulder while you press your wrist against the door frame. Keep your shoulder blade tucked down toward the middle of your back. 4. Hold for __________ seconds. 5. Slowly release the tension, and relax your muscles completely before you repeat the exercise. Repeat __________ times. Complete this exercise __________ times a day. Internal rotation, isometric This is an exercise in which you press your palm against a door frame without moving your shoulder joint (isometric). 1. Stand or sit in a doorway, facing the door frame. 2. Bend your left / right elbow and place the palm of your hand against the door frame. Only your palm should be touching the frame. Keep your upper arm at your side. 3. Gently press your hand against the door frame, as if you are trying to push your arm toward your abdomen (internal rotation). Press as hard as you are able without pain. ? Avoid shrugging your shoulder while you press your hand against the door frame. Keep your shoulder blade tucked down toward the middle of your back. 4. Hold for __________ seconds. 5. Slowly release the tension, and relax your muscles completely before you repeat the exercise.  Repeat __________ times. Complete this exercise __________ times a day. Scapular protraction, supine  1. Lie on your back on a firm surface (supine position). Hold a __________ weight in your left / right hand. 2. Raise your left / right arm straight into the air so your hand is directly above your shoulder joint. 3. Push the weight into the air so your shoulder (scapula) lifts off the surface that you are lying on. The scapula  will push up or forward (protraction). Do not move your head, neck, or back. 4. Hold for __________ seconds. 5. Slowly return to the starting position. Let your muscles relax completely before you repeat this exercise. Repeat __________ times. Complete this exercise __________ times a day. Scapular retraction  1. Sit in a stable chair without armrests, or stand up. 2. Secure an exercise band to a stable object in front of you so the band is at shoulder height. 3. Hold one end of the exercise band in each hand. Your palms should face down. 4. Squeeze your shoulder blades together (retraction) and move your elbows slightly behind you. Do not shrug your shoulders upward while you do this. 5. Hold for __________ seconds. 6. Slowly return to the starting position. Repeat __________ times. Complete this exercise __________ times a day. Shoulder extension  1. Sit in a stable chair without armrests, or stand up. 2. Secure an exercise band to a stable object in front of you so the band is above shoulder height. 3. Hold one end of the exercise band in each hand. 4. Straighten your elbows and lift your hands up to shoulder height. 5. Squeeze your shoulder blades together and pull your hands down to the sides of your thighs (extension). Stop when your hands are straight down by your sides. Do not let your hands go behind your body. 6. Hold for __________ seconds. 7. Slowly return to the starting position. Repeat __________ times. Complete this exercise __________ times a day. This information is not intended to replace advice given to you by your health care provider. Make sure you discuss any questions you have with your health care provider. Document Revised: 10/29/2018 Document Reviewed: 08/02/2018 Elsevier Patient Education  [place].

## 2020-03-29 ENCOUNTER — Encounter: Payer: Self-pay | Admitting: Family Medicine

## 2020-03-29 ENCOUNTER — Ambulatory Visit: Payer: Medicare Other | Admitting: Family Medicine

## 2020-03-29 ENCOUNTER — Other Ambulatory Visit: Payer: Self-pay

## 2020-03-29 VITALS — BP 122/86 | HR 75 | Ht 63.0 in | Wt 306.8 lb

## 2020-03-29 DIAGNOSIS — G8929 Other chronic pain: Secondary | ICD-10-CM | POA: Diagnosis not present

## 2020-03-29 DIAGNOSIS — M25512 Pain in left shoulder: Secondary | ICD-10-CM | POA: Diagnosis not present

## 2020-03-29 NOTE — Progress Notes (Signed)
   I, [name], LAT, ATC, am serving as scribe for Dr. [name].  [name] is a 59 y.o. female who presents to [place] at [facility] today for f/u of L shoulder pain.  She was last seen by Dr. Georgina Snell on 02/20/20 and had a L subacromial injection.  She was referred to PT but has not completed any visits.  Since her last visit, pt reports that her L shoulder is feeling much better and reports approximately 90% improvement.  She no longer feels that she needs PT.   Pertinent review of systems: No fevers or chills  Relevant historical information: Hypertension, morbid obesity   Exam:  BP 122/86 (BP Location: Left Arm, Patient Position: Sitting, Cuff Size: Large)   Pulse 75   Ht 5\' 3"  (1.6 m)   Wt (!) 306 lb 12.8 oz (139.2 kg)   SpO2 96%   BMI 54.35 kg/m  General: Well Developed, well nourished, and in no acute distress.   MSK: Left shoulder normal-appearing normal motion.    Assessment and Plan: 59 y.o. female with left shoulder pain doing quite well with injection and home exercise program.  Doubtful need for physical therapy at this time.  Discussed future options.  If pain should return could proceed with repeat injection or formal physical therapy or even further advanced imaging.  Recheck as needed.   Discussed warning signs or symptoms. Please see discharge instructions. Patient expresses understanding.   The above documentation has been reviewed and is accurate and complete [name], M.D.  Total encounter time 20 minutes including face-to-face time with the patient and charting on the date of service.

## 2020-03-29 NOTE — Patient Instructions (Signed)
Thank you for coming in today. Continue your home exercises.  Recheck or re contact me as needed.  Could do another injection or actual PT or even MRI.

## 2020-04-02 ENCOUNTER — Ambulatory Visit: Payer: Medicare Other | Admitting: Family Medicine

## 2020-04-11 IMAGING — US US ABDOMEN COMPLETE
1 series · 13 of 25 positions shown · non-contrast
Comparison: None.

CLINICAL DATA: Abdominal pain radiating to the right flank since
last night.

EXAM:
ABDOMEN ULTRASOUND COMPLETE

[Series 1: us abdomen complete · 13 of 106 slices shown]
[im 1/106]
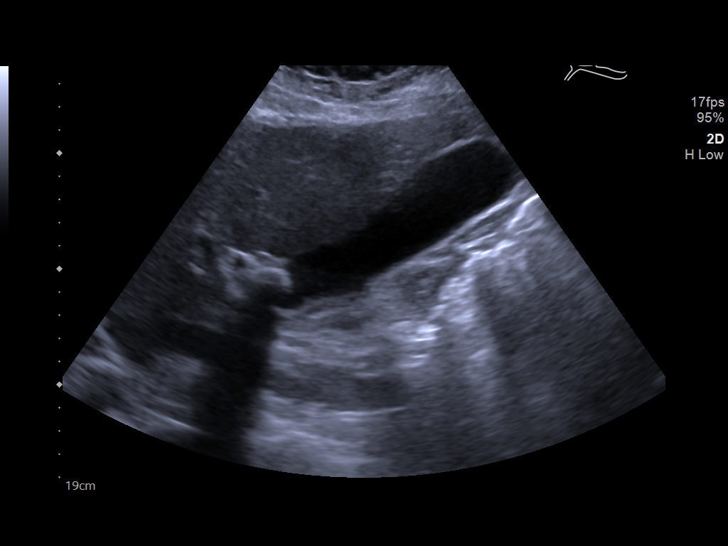
[im 9/106]
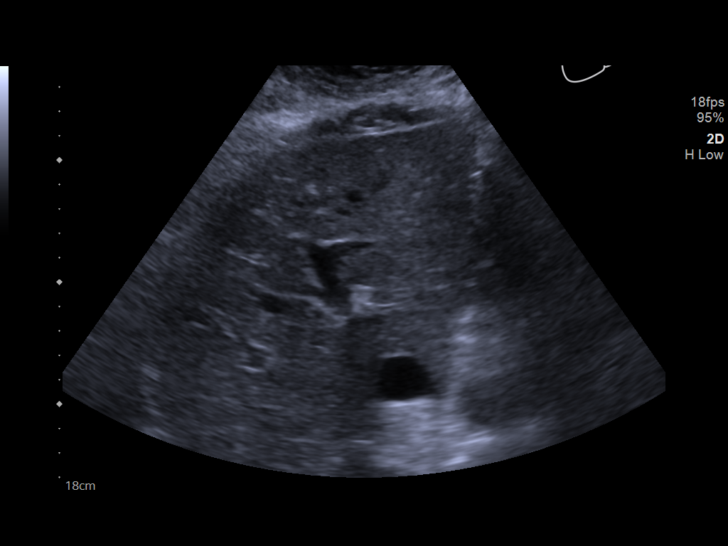
[im 18/106]
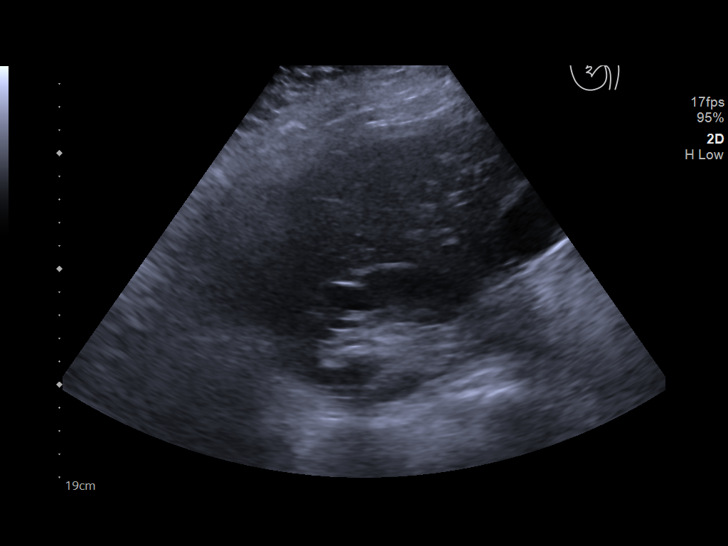
[im 27/106]
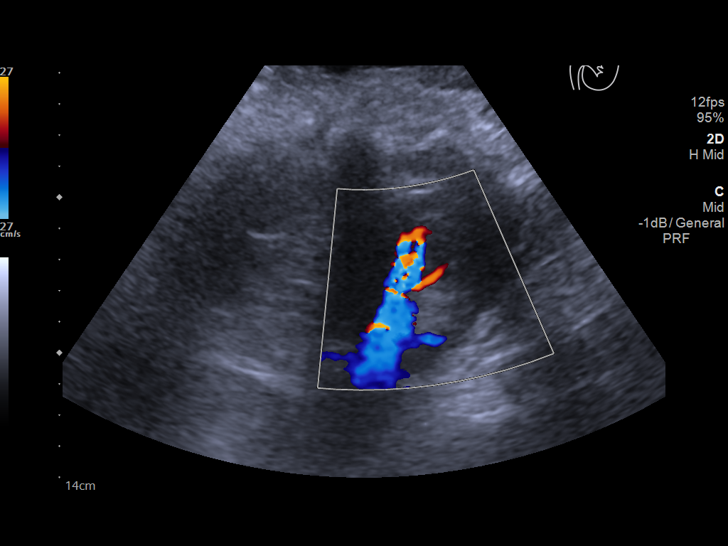
[im 36/106]
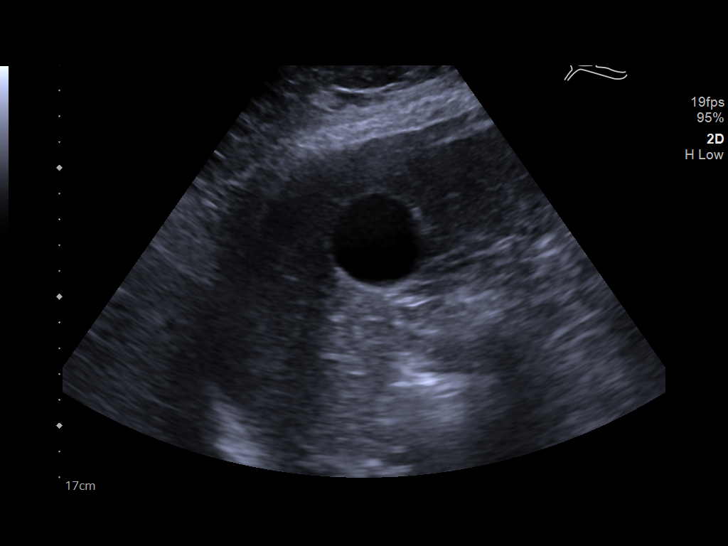
[im 44/106]
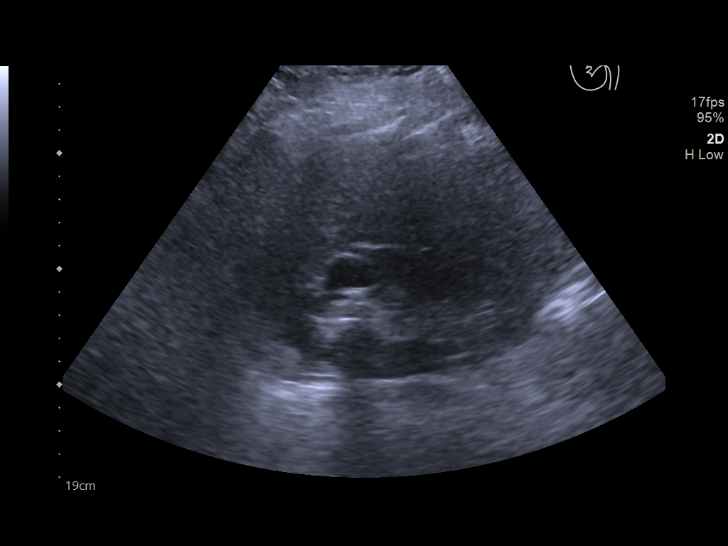
[im 53/106]
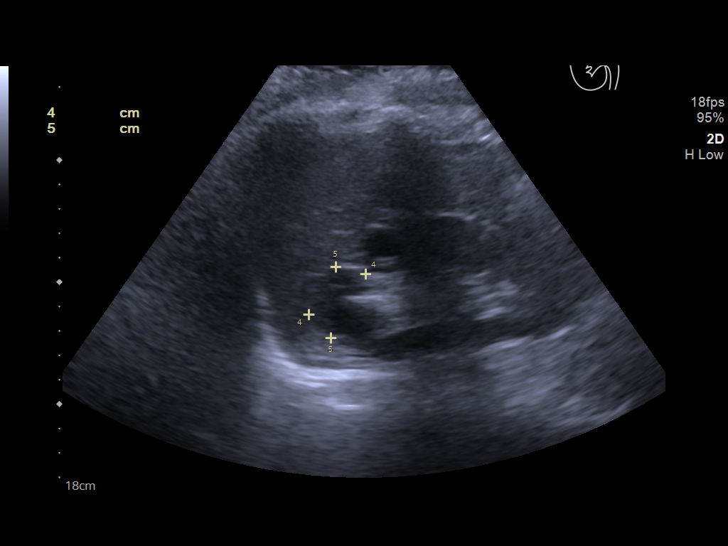
[im 62/106]
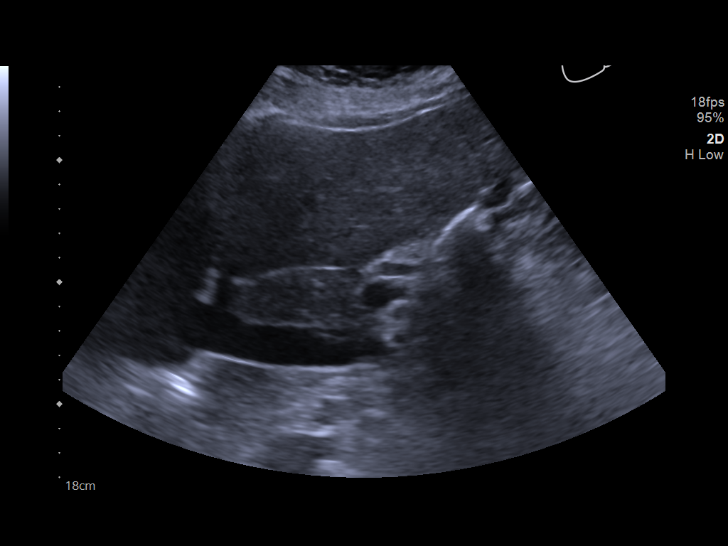
[im 71/106]
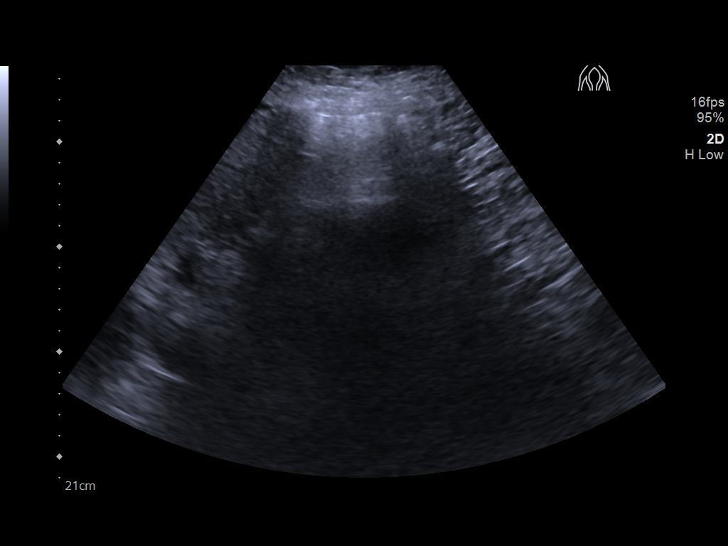
[im 79/106]
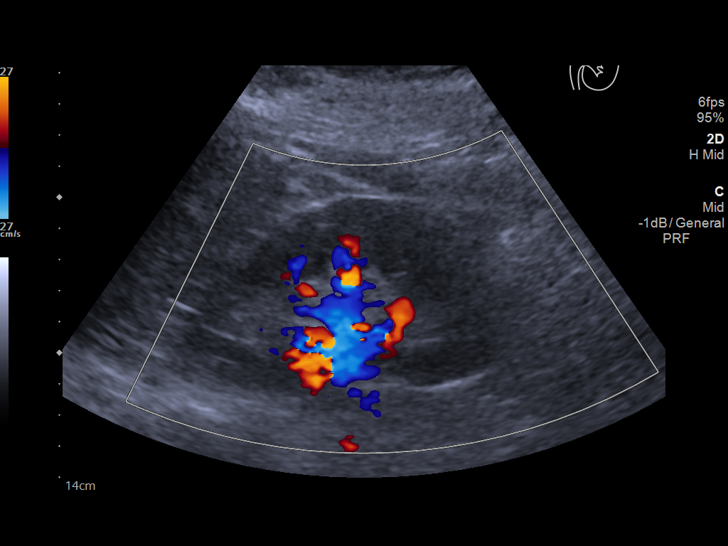
[im 88/106]
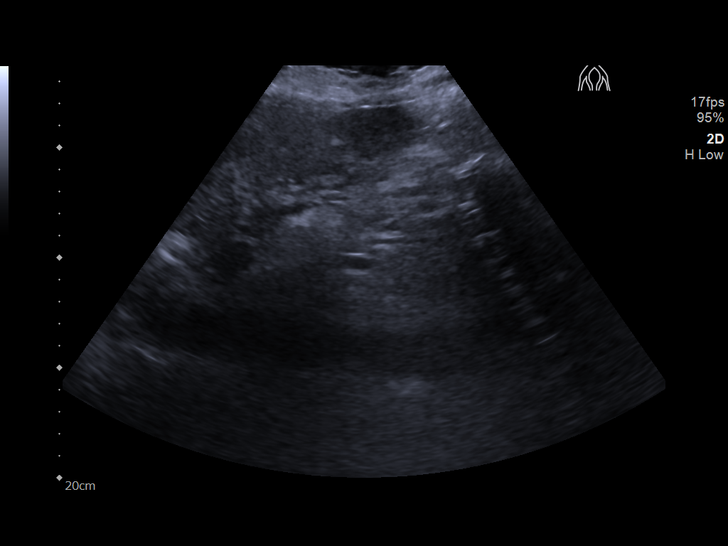
[im 97/106]
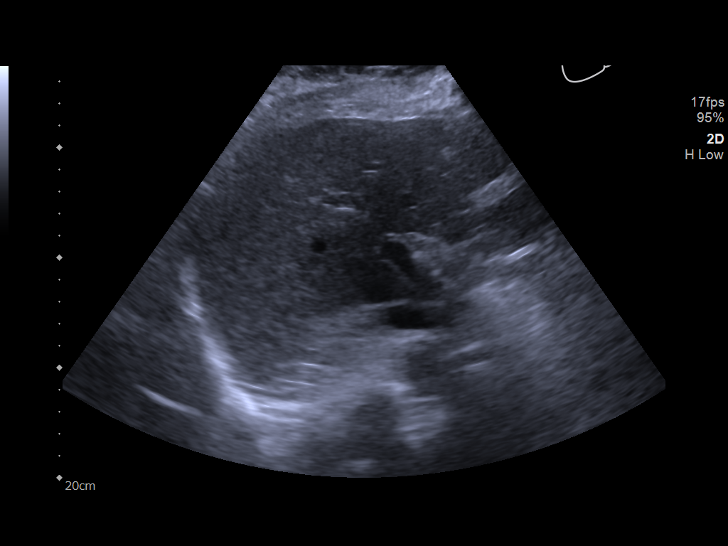
[im 106/106]
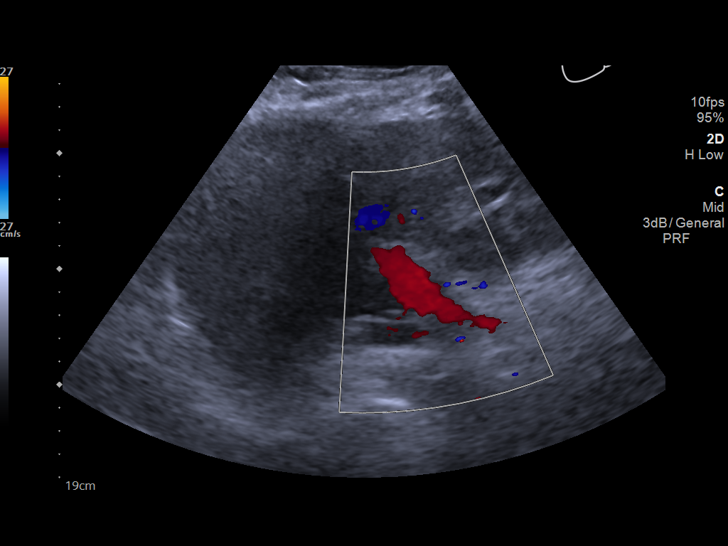

[13 of 25 positions shown; findings below may reference images not displayed]

FINDINGS: Examination limited by patient body habitus.

Gallbladder: There is a nonmobile 13 mm shadowing gallstone in the
gallbladder neck with sonographic Murphy's sign. No gallbladder wall
thickening or pericholecystic fluid. No gallbladder distention.

Common bile duct: Diameter: 5 mm on limited views

Liver: Liver parenchyma is diffusely mildly echogenic. No definite
liver surface irregularity. No liver mass demonstrated, noting
decreased sensitivity in the setting of an echogenic liver. Portal
vein is patent on color Doppler imaging with normal direction of
blood flow towards the liver.

IVC: No abnormality visualized.

Pancreas: Visualized portion unremarkable.

Spleen: Size and appearance within normal limits.

Right Kidney: Length: 11.0 cm. Normal right renal parenchymal
echogenicity and thickness. No right hydronephrosis. Poorly
visualized 2.9 x 2.5 x 3.5 cm upper right renal cyst with thin
internal septation and lobulated outer contour. Separate simple
x 1.5 x 2.1 cm upper right renal cyst. Questionable 6 mm shadowing
upper right renal stone.

Left Kidney: Length: 11.6 cm. Echogenicity within normal limits. No
mass or hydronephrosis visualized.

Abdominal aorta: No aneurysm visualized.

Other findings: None.
IMPRESSION: 1. Cholelithiasis with nonmobile gallstone in the gallbladder neck.
Sonographic Murphy sign is present. Findings suggest acute
cholecystitis.
2. No biliary ductal dilatation.
3. Mildly echogenic liver parenchyma, a nonspecific finding most
commonly due to diffuse hepatic steatosis.
4. Poorly visualized upper right renal cysts due to patient related
limitations. Suggest either attention on follow-up renal sonogram in
6 months or further evaluation with short-term outpatient renal mass
protocol MRI abdomen without and with IV contrast.
5. Questionable right nephrolithiasis.  No hydronephrosis.

## 2020-05-22 ENCOUNTER — Other Ambulatory Visit: Payer: Self-pay | Admitting: Internal Medicine

## 2020-05-22 DIAGNOSIS — Z1231 Encounter for screening mammogram for malignant neoplasm of breast: Secondary | ICD-10-CM

## 2020-07-03 ENCOUNTER — Other Ambulatory Visit: Payer: Self-pay

## 2020-07-03 ENCOUNTER — Ambulatory Visit
Admission: RE | Admit: 2020-07-03 | Discharge: 2020-07-03 | Disposition: A | Discharge status: Home / Self Care | Payer: Medicare Other | Source: Ambulatory Visit | Attending: Internal Medicine | Admitting: Internal Medicine

## 2020-07-03 DIAGNOSIS — Z1231 Encounter for screening mammogram for malignant neoplasm of breast: Secondary | ICD-10-CM | POA: Diagnosis not present

## 2020-09-11 ENCOUNTER — Other Ambulatory Visit: Payer: Self-pay | Admitting: Internal Medicine

## 2020-10-05 ENCOUNTER — Other Ambulatory Visit: Payer: Self-pay | Admitting: Internal Medicine

## 2020-10-11 ENCOUNTER — Encounter: Payer: Self-pay | Admitting: Internal Medicine

## 2020-10-11 ENCOUNTER — Ambulatory Visit (INDEPENDENT_AMBULATORY_CARE_PROVIDER_SITE_OTHER): Payer: Medicare Other | Admitting: Internal Medicine

## 2020-10-11 ENCOUNTER — Other Ambulatory Visit: Payer: Self-pay

## 2020-10-11 VITALS — BP 130/68 | HR 82 | Temp 98.8°F | Resp 18 | Ht 63.0 in | Wt 305.4 lb

## 2020-10-11 DIAGNOSIS — G4452 New daily persistent headache (NDPH): Secondary | ICD-10-CM | POA: Diagnosis not present

## 2020-10-11 DIAGNOSIS — Z0000 Encounter for general adult medical examination without abnormal findings: Secondary | ICD-10-CM | POA: Diagnosis not present

## 2020-10-11 DIAGNOSIS — K219 Gastro-esophageal reflux disease without esophagitis: Secondary | ICD-10-CM | POA: Diagnosis not present

## 2020-10-11 DIAGNOSIS — I10 Essential (primary) hypertension: Secondary | ICD-10-CM | POA: Diagnosis not present

## 2020-10-11 LAB — COMPREHENSIVE METABOLIC PANEL
ALT: 8 U/L (ref 0–35)
AST: 11 U/L (ref 0–37)
Albumin: 4.2 g/dL (ref 3.5–5.2)
Alkaline Phosphatase: 124 U/L — ABNORMAL HIGH (ref 39–117)
BUN: 13 mg/dL (ref 6–23)
CO2: 30 mEq/L (ref 19–32)
Calcium: 9.2 mg/dL (ref 8.4–10.5)
Chloride: 102 mEq/L (ref 96–112)
Creatinine, Ser: 0.78 mg/dL (ref 0.40–1.20)
GFR: 82.45 mL/min (ref >60.00)
Glucose, Bld: 85 mg/dL (ref 70–99)
Potassium: 3.3 mEq/L — ABNORMAL LOW (ref 3.5–5.1)
Sodium: 140 mEq/L (ref 135–145)
Total Bilirubin: 0.6 mg/dL (ref 0.2–1.2)
Total Protein: 8.1 g/dL (ref 6.0–8.3)

## 2020-10-11 LAB — LIPID PANEL
Cholesterol: 114 mg/dL (ref 0–200)
HDL: 38.6 mg/dL — ABNORMAL LOW (ref >39.00)
LDL Cholesterol: 52 mg/dL (ref 0–99)
NonHDL: 75.32
Total CHOL/HDL Ratio: 3
Triglycerides: 116 mg/dL (ref 0.0–149.0)
VLDL: 23.2 mg/dL (ref 0.0–40.0)

## 2020-10-11 LAB — CBC
HCT: 37.8 % (ref 36.0–46.0)
Hemoglobin: 12.5 g/dL (ref 12.0–15.0)
MCHC: 32.9 g/dL (ref 30.0–36.0)
MCV: 82.9 fl (ref 78.0–100.0)
Platelets: 254 10*3/uL (ref 150.0–400.0)
RBC: 4.56 Mil/uL (ref 3.87–5.11)
RDW: 14.1 % (ref 11.5–15.5)
WBC: 8 10*3/uL (ref 4.0–10.5)

## 2020-10-11 LAB — HEMOGLOBIN A1C: Hgb A1c MFr Bld: 5.9 % (ref 4.6–6.5)

## 2020-10-11 NOTE — Patient Instructions (Addendum)
We are checking the scan of the head and the labs today. You are up to date on your shots.   Health Maintenance, Female Adopting a healthy lifestyle and getting preventive care are important in promoting health and wellness. Ask your health care provider about:  The right schedule for you to have regular tests and exams.  Things you can do on your own to prevent diseases and keep yourself healthy. What should I know about diet, weight, and exercise? Eat a healthy diet  Eat a diet that includes plenty of vegetables, fruits, low-fat dairy products, and lean protein.  Do not eat a lot of foods that are high in solid fats, added sugars, or sodium.   Maintain a healthy weight Body mass index (BMI) is used to identify weight problems. It estimates body fat based on height and weight. Your health care provider can help determine your BMI and help you achieve or maintain a healthy weight. Get regular exercise Get regular exercise. This is one of the most important things you can do for your health. Most adults should:  Exercise for at least 150 minutes each week. The exercise should increase your heart rate and make you sweat (moderate-intensity exercise).  Do strengthening exercises at least twice a week. This is in addition to the moderate-intensity exercise.  Spend less time sitting. Even light physical activity can be beneficial. Watch cholesterol and blood lipids Have your blood tested for lipids and cholesterol at 61 years of age, then have this test every 5 years. Have your cholesterol levels checked more often if:  Your lipid or cholesterol levels are high.  You are older than 61 years of age.  You are at high risk for heart disease. What should I know about cancer screening? Depending on your health history and family history, you may need to have cancer screening at various ages. This may include screening for:  Breast cancer.  Cervical cancer.  Colorectal cancer.  Skin  cancer.  Lung cancer. What should I know about heart disease, diabetes, and high blood pressure? Blood pressure and heart disease  High blood pressure causes heart disease and increases the risk of stroke. This is more likely to develop in people who have high blood pressure readings, are of African descent, or are overweight.  Have your blood pressure checked: ? Every 3-5 years if you are 52-79 years of age. ? Every year if you are 1 years old or older. Diabetes Have regular diabetes screenings. This checks your fasting blood sugar level. Have the screening done:  Once every three years after age 65 if you are at a normal weight and have a low risk for diabetes.  More often and at a younger age if you are overweight or have a high risk for diabetes. What should I know about preventing infection? Hepatitis B If you have a higher risk for hepatitis B, you should be screened for this virus. Talk with your health care provider to find out if you are at risk for hepatitis B infection. Hepatitis C Testing is recommended for:  Everyone born from [birth year] through 1966.  Anyone with known risk factors for hepatitis C. Sexually transmitted infections (STIs)  Get screened for STIs, including gonorrhea and chlamydia, if: ? You are sexually active and are younger than 61 years of age. ? You are older than 61 years of age and your health care provider tells you that you are at risk for this type of infection. ? Your sexual activity  has changed since you were last screened, and you are at increased risk for chlamydia or gonorrhea. Ask your health care provider if you are at risk.  Ask your health care provider about whether you are at high risk for HIV. Your health care provider may recommend a prescription medicine to help prevent HIV infection. If you choose to take medicine to prevent HIV, you should first get tested for HIV. You should then be tested every 3 months for as long as you are taking  the medicine. Pregnancy  If you are about to stop having your period (premenopausal) and you may become pregnant, seek counseling before you get pregnant.  Take 400 to 800 micrograms (mcg) of folic acid every day if you become pregnant.  Ask for birth control (contraception) if you want to prevent pregnancy. Osteoporosis and menopause Osteoporosis is a disease in which the bones lose minerals and strength with aging. This can result in bone fractures. If you are 67 years old or older, or if you are at risk for osteoporosis and fractures, ask your health care provider if you should:  Be screened for bone loss.  Take a calcium or vitamin D supplement to lower your risk of fractures.  Be given hormone replacement therapy (HRT) to treat symptoms of menopause. Follow these instructions at home: Lifestyle  Do not use any products that contain nicotine or tobacco, such as cigarettes, e-cigarettes, and chewing tobacco. If you need help quitting, ask your health care provider.  Do not use street drugs.  Do not share needles.  Ask your health care provider for help if you need support or information about quitting drugs. Alcohol use  Do not drink alcohol if: ? Your health care provider tells you not to drink. ? You are pregnant, may be pregnant, or are planning to become pregnant.  If you drink alcohol: ? Limit how much you use to 0-1 drink a day. ? Limit intake if you are breastfeeding.  Be aware of how much alcohol is in your drink. In the [place] one drink equals one 12 oz bottle of beer (355 mL), one 5 oz glass of wine (148 mL), or one 1 oz glass of hard liquor (44 mL). General instructions  Schedule regular health, dental, and eye exams.  Stay current with your vaccines.  Tell your health care provider if: ? You often feel depressed. ? You have ever been abused or do not feel safe at home. Summary  Adopting a healthy lifestyle and getting preventive care are important in  promoting health and wellness.  Follow your health care provider's instructions about healthy diet, exercising, and getting tested or screened for diseases.  Follow your health care provider's instructions on monitoring your cholesterol and blood pressure. This information is not intended to replace advice given to you by your health care provider. Make sure you discuss any questions you have with your health care provider. Document Revised: 06/30/2018 Document Reviewed: 06/30/2018 Elsevier Patient Education  2021 [employer].

## 2020-10-11 NOTE — Progress Notes (Signed)
Subjective:   Patient ID: [name], female    DOB: 1961-06-28, 60 y.o.   MRN: [id number]  HPI Here for medicare wellness and physical, with new complaints. Please see A/P for status and treatment of chronic medical problems.   HPI #3: Here for new headaches. Started in the last weeks. Come and are very severe 10/10 pain. Last 5 minutes or less. Always in the same part of her head. Not associated with any sinus drainage or pressure. Some blurring of vision during this. Mother died brain cancer so she is concerned about this. Denies weight change recently. After headache gone vision is improved. The pain is located above the eyes.  Diet: heart healthy  Physical activity: sedentary Depression/mood screen: negative Hearing: intact to whispered voice Visual acuity: grossly normal with lens, performs annual eye exam  ADLs: capable Fall risk: none Home safety: good Cognitive evaluation: intact to orientation, naming, recall and repetition EOL planning: adv directives discussed  [place] Visit from 10/11/2020 in [place] [facility] Total Score 0      I have personally reviewed and have noted 1. The patient's medical and social history - reviewed today no changes 2. Their use of alcohol, tobacco or illicit drugs 3. Their current medications and supplements 4. The patient's functional ability including ADL's, fall risks, home safety risks and hearing or visual impairment. 5. Diet and physical activities 6. Evidence for depression or mood disorders 7. Care team reviewed and updated 8.  The patient is not on an opioid pain medication.   Patient Care Team: Hoyt Koch, MD as PCP - General (Internal Medicine) Past Medical History:  Diagnosis Date   Bronchitis    Complication of anesthesia    hard to wake up years ago   Environmental and seasonal allergies    dust mites, dust, cats,trees,   GERD (gastroesophageal reflux disease)     Headache    Heart murmur    Hypertension    Shortness of breath dyspnea    with exertion   Past Surgical History:  Procedure Laterality Date   ABDOMINAL HYSTERECTOMY     CHOLECYSTECTOMY N/A 09/27/2018   Procedure: LAPAROSCOPIC CHOLECYSTECTOMY;  Surgeon: Erroll Luna, MD;  Location: [facility];  Service: General;  Laterality: N/A;   CYST REMOVAL HAND     NASAL SINUS SURGERY     ORIF ANKLE FRACTURE Right 07/30/2015   Procedure: OPEN REDUCTION INTERNAL FIXATION (ORIF) RIGHT  ANKLE ;  Surgeon: Rod Can, MD;  Location: [facility];  Service: Orthopedics;  Laterality: Right;   History reviewed. No pertinent family history.  Review of Systems  Constitutional: Negative.   HENT: Negative.   Eyes: Negative.   Respiratory: Negative for cough, chest tightness and shortness of breath.   Cardiovascular: Negative for chest pain, palpitations and leg swelling.  Gastrointestinal: Negative for abdominal distention, abdominal pain, constipation, diarrhea, nausea and vomiting.  Musculoskeletal: Positive for arthralgias.  Skin: Negative.   Neurological: Positive for headaches.  Psychiatric/Behavioral: Negative.     Objective:  Physical Exam Constitutional:      Appearance: She is well-developed. She is obese.  HENT:     Head: Normocephalic and atraumatic.  Cardiovascular:     Rate and Rhythm: Normal rate and regular rhythm.  Pulmonary:     Effort: Pulmonary effort is normal. No respiratory distress.     Breath sounds: Normal breath sounds. No wheezing or rales.  Abdominal:     General: Bowel sounds are normal.  There is no distension.     Palpations: Abdomen is soft.     Tenderness: There is no abdominal tenderness. There is no rebound.  Musculoskeletal:        General: No tenderness.     Cervical back: Normal range of motion.  Skin:    General: Skin is warm and dry.  Neurological:     Mental Status: She is alert and oriented to person, place, and time.     Coordination:  Coordination normal.     Vitals:   10/11/20 1249  BP: 130/68  Pulse: 82  Resp: 18  Temp: 98.8 F (37.1 C)  TempSrc: Oral  SpO2: 98%  Weight: (!) 305 lb 6.4 oz (138.5 kg)  Height: 5\' 3"  (1.6 m)   This visit occurred during the SARS-CoV-2 public health emergency.  Safety protocols were in place, including screening questions prior to the visit, additional usage of staff PPE, and extensive cleaning of exam room while observing appropriate contact time as indicated for disinfecting solutions.   Assessment & Plan:

## 2020-10-12 DIAGNOSIS — G4452 New daily persistent headache (NDPH): Secondary | ICD-10-CM | POA: Insufficient documentation

## 2020-10-12 NOTE — Assessment & Plan Note (Signed)
Weight stable and advised to work on diet and exercise to help.

## 2020-10-12 NOTE — Assessment & Plan Note (Signed)
Checking CT head to rule out new stroke, mass, changes. Adjust as needed.

## 2020-10-12 NOTE — Assessment & Plan Note (Signed)
Stopped taking protonix and overall stable.

## 2020-10-12 NOTE — Assessment & Plan Note (Signed)
BP at goal on telmisartan/hctz. Checking CMP and adjust as needed.

## 2020-10-12 NOTE — Assessment & Plan Note (Signed)
Flu shot up to date. Covid-19 up to date including booster. Shingrix complete. Tetanus due 2024. Colonoscopy due 2024. Mammogram due 2023, pap smear not indicated and dexa indicated at age 60. Counseled about sun safety and mole surveillance. Counseled about the dangers of distracted driving. Given 10 year screening recommendations.

## 2020-10-18 ENCOUNTER — Encounter: Payer: Self-pay | Admitting: Internal Medicine

## 2020-10-30 ENCOUNTER — Other Ambulatory Visit: Payer: Self-pay | Admitting: Internal Medicine

## 2020-10-31 DIAGNOSIS — H25813 Combined forms of age-related cataract, bilateral: Secondary | ICD-10-CM | POA: Diagnosis not present

## 2020-10-31 DIAGNOSIS — G43109 Migraine with aura, not intractable, without status migrainosus: Secondary | ICD-10-CM | POA: Diagnosis not present

## 2020-10-31 DIAGNOSIS — H35033 Hypertensive retinopathy, bilateral: Secondary | ICD-10-CM | POA: Diagnosis not present

## 2020-11-06 ENCOUNTER — Ambulatory Visit
Admission: RE | Admit: 2020-11-06 | Discharge: 2020-11-06 | Disposition: A | Discharge status: Home / Self Care | Payer: Medicare Other | Source: Ambulatory Visit | Attending: Internal Medicine | Admitting: Internal Medicine

## 2020-11-06 DIAGNOSIS — G4452 New daily persistent headache (NDPH): Secondary | ICD-10-CM

## 2020-11-06 DIAGNOSIS — R519 Headache, unspecified: Secondary | ICD-10-CM | POA: Diagnosis not present

## 2020-11-08 DIAGNOSIS — Z0000 Encounter for general adult medical examination without abnormal findings: Secondary | ICD-10-CM | POA: Diagnosis not present

## 2020-11-08 DIAGNOSIS — Z1211 Encounter for screening for malignant neoplasm of colon: Secondary | ICD-10-CM | POA: Diagnosis not present

## 2020-11-21 ENCOUNTER — Other Ambulatory Visit: Payer: Self-pay | Admitting: Internal Medicine

## 2021-04-19 ENCOUNTER — Ambulatory Visit: Payer: Medicare (Managed Care) | Admitting: Nurse Practitioner

## 2021-04-26 ENCOUNTER — Other Ambulatory Visit: Payer: Self-pay

## 2021-04-26 ENCOUNTER — Ambulatory Visit (INDEPENDENT_AMBULATORY_CARE_PROVIDER_SITE_OTHER): Payer: Medicare (Managed Care) | Admitting: Nurse Practitioner

## 2021-04-26 ENCOUNTER — Encounter: Payer: Self-pay | Admitting: Nurse Practitioner

## 2021-04-26 VITALS — BP 128/84 | HR 83 | Temp 97.8°F | Ht 63.0 in | Wt 296.0 lb

## 2021-04-26 DIAGNOSIS — M79602 Pain in left arm: Secondary | ICD-10-CM | POA: Diagnosis not present

## 2021-04-26 NOTE — Progress Notes (Signed)
Subjective:  Patient ID: [name], female    DOB: 1960-11-28  Age: 60 y.o. MRN: [id number]  CC:  Chief Complaint  Patient presents with   Other    Left shoulder pain      HPI  This patient arrives today for the above.  She reports left shoulder pain that radiates down her left arm.  This is been a chronic issue that is been ongoing for about a year or 2.  She tells me that she will take Aleve with improvement in her symptoms but does not want to take pain medication on a daily basis.  She has been seen by sports medicine in the past and underwent some sort of injection which helped her symptoms.  She like to be referred to them for another injection.  She tells me lifting anything her left arm or really any movement of the left shoulder elicits pain.  She does try to keep it active and does swim on a daily basis.  She denies chest pain or shortness of breath.  Past Medical History:  Diagnosis Date   Bronchitis    Complication of anesthesia    hard to wake up years ago   Environmental and seasonal allergies    dust mites, dust, cats,trees,   GERD (gastroesophageal reflux disease)    Headache    Heart murmur    Hypertension    Shortness of breath dyspnea    with exertion      History reviewed. No pertinent family history.  Social History   Social History Narrative   Was in the [employer] x1 year   Social History   Tobacco Use   Smoking status: Former    Packs/day: 1.00    Years: 35.00    Pack years: 35.00    Types: Cigarettes    Quit date: 09/18/2013    Years since quitting: 7.6   Smokeless tobacco: Never  Substance Use Topics   Alcohol use: No     Current Meds  Medication Sig   acetaminophen (TYLENOL) 500 MG tablet Take 1,000 mg by mouth every 6 (six) hours as needed for mild pain or headache.   cromolyn (NASALCROM) 5.2 MG/ACT nasal spray Place 1 spray into both nostrils 4 (four) times daily.   fluticasone (FLONASE) 50 MCG/ACT nasal spray Place 2  sprays into both nostrils daily as needed for allergies.    ibuprofen (ADVIL,MOTRIN) 200 MG tablet Take 400 mg by mouth every 6 (six) hours as needed for moderate pain.   levocetirizine (XYZAL) 5 MG tablet Take 5 mg by mouth every evening. Reported on 11/12/2015   montelukast (SINGULAIR) 10 MG tablet Take 10 mg by mouth every evening.   telmisartan-hydrochlorothiazide (MICARDIS HCT) 80-25 MG tablet TAKE 1 TABLET BY MOUTH DAILY. PLEASE CALL OUR OFFICE TO SCHEDULE A FOLLOW UP   triamcinolone cream (KENALOG) 0.1 % APPLY TO AFFECTED AREA TWICE A DAY   TURMERIC CURCUMIN PO Take 500 mg by mouth daily.    [DISCONTINUED] pantoprazole (PROTONIX) 40 MG tablet TAKE 1 TABLET BY MOUTH EVERY DAY    ROS:  Review of Systems  Respiratory:  Negative for shortness of breath.   Cardiovascular:  Negative for chest pain.  Musculoskeletal:        (+) pain from left shoulder down to thumb  Neurological:  Negative for sensory change and weakness.    Objective:   Today's Vitals: BP 128/84 (BP Location: Left Arm, Patient Position: Sitting, Cuff Size: Large)  Pulse 83   Temp 97.8 F (36.6 C) (Oral)   Ht 5\' 3"  (1.6 m)   Wt 296 lb (134.3 kg)   SpO2 99%   BMI 52.43 kg/m  Vitals with BMI 04/26/2021 10/11/2020 03/29/2020  Height 5\' 3"  5\' 3"  5\' 3"   Weight 296 lbs 305 lbs 6 oz 306 lbs 13 oz  BMI 52.45 68.08 81.10  Systolic 315 945 859  Diastolic 84 68 86  Pulse 83 82 75     Physical Exam Vitals reviewed.  Constitutional:      General: She is not in acute distress.    Appearance: Normal appearance.  HENT:     Head: Normocephalic and atraumatic.  Neck:     Vascular: No carotid bruit.  Cardiovascular:     Rate and Rhythm: Normal rate and regular rhythm.     Pulses: Normal pulses.     Heart sounds: Normal heart sounds.  Pulmonary:     Effort: Pulmonary effort is normal.     Breath sounds: Normal breath sounds.  Musculoskeletal:     Left shoulder: Tenderness present. No swelling or deformity. Decreased  range of motion.  Skin:    General: Skin is warm and dry.  Neurological:     General: No focal deficit present.     Mental Status: She is alert and oriented to person, place, and time.  Psychiatric:        Mood and Affect: Mood normal.        Behavior: Behavior normal.        Judgment: Judgment normal.         Assessment and Plan   1. Left arm pain      Plan: 1.  Will refer her back to sports medicine.  She tells me she thinks she saw them last about 2 years ago, I encouraged her to call them and see if she is still an established patient as she may be able to get an appointment sooner than if we ordered new referral.  She tells me she understands.   Tests ordered Orders Placed This Encounter  Procedures   Ambulatory referral to Sports Medicine      No orders of the defined types were placed in this encounter.   Patient to follow-up as needed and when due for complete physical exam.  Ailene Ards, NP

## 2021-04-29 ENCOUNTER — Ambulatory Visit: Payer: Medicare (Managed Care) | Admitting: Family Medicine

## 2021-04-29 ENCOUNTER — Ambulatory Visit (INDEPENDENT_AMBULATORY_CARE_PROVIDER_SITE_OTHER): Payer: Medicare (Managed Care)

## 2021-04-29 ENCOUNTER — Ambulatory Visit: Payer: Self-pay

## 2021-04-29 ENCOUNTER — Other Ambulatory Visit: Payer: Self-pay

## 2021-04-29 ENCOUNTER — Encounter: Payer: Self-pay | Admitting: Family Medicine

## 2021-04-29 VITALS — BP 140/88 | HR 88 | Ht 63.0 in | Wt 299.4 lb

## 2021-04-29 DIAGNOSIS — M79642 Pain in left hand: Secondary | ICD-10-CM

## 2021-04-29 DIAGNOSIS — M79672 Pain in left foot: Secondary | ICD-10-CM | POA: Diagnosis not present

## 2021-04-29 DIAGNOSIS — M25512 Pain in left shoulder: Secondary | ICD-10-CM | POA: Diagnosis not present

## 2021-04-29 DIAGNOSIS — G8929 Other chronic pain: Secondary | ICD-10-CM | POA: Diagnosis not present

## 2021-04-29 NOTE — Progress Notes (Signed)
I, [name], LAT, ATC, am serving as scribe for Dr. [name].  [name] is a 60 y.o. female who presents to [place] at [facility] today for f/u of chronic L shoulder pain.  She was last seen by Dr. Georgina Snell on 03/29/20 and noted 90% improvement in her symptoms after having a L subacromial steroid injection on 02/20/20.  She was also referred to PT but never went as her symptoms improved w/ the injection.  Today, pt reports pain resumed over the past 1-2 months. Pt locates pain to L trap w/ radiating pain to the L thumb. No numbness/tingling. No neck pain.   Pertinent review of systems: No fevers or chills.  Denies any thumb triggering.  Relevant historical information: History left trigger thumb in the past.   Exam:  BP 140/88   Pulse 88   Ht 5\' 3"  (1.6 m)   Wt 299 lb 6.4 oz (135.8 kg)   SpO2 98%   BMI 53.04 kg/m  General: Well Developed, well nourished, and in no acute distress.   MSK: C-spine normal-appearing Normal cervical motion.  Negative Spurling's test. Left shoulder normal-appearing Nontender. Decreased range of motion to abduction and internal rotation and external rotation. Positive Hawkins and Neer's test.  Negative Yergason's and speeds test. Strength 4/5 abduction and external rotation 5/5 internal rotation.  Left thumb normal-appearing No triggering present. Tender palpation first [place].    Lab and Radiology Results  Procedure: Real-time Ultrasound Guided Injection of left shoulder subacromial bursa Device: Philips Affiniti 50G Images permanently stored and available for review in PACS Verbal informed consent obtained.  Discussed risks and benefits of procedure. Warned about infection bleeding damage to structures skin hypopigmentation and fat atrophy among others. Patient expresses understanding and agreement Time-out conducted.   Noted no overlying erythema, induration, or other signs of local infection.   Skin prepped in a  sterile fashion.   Local anesthesia: Topical Ethyl chloride.   With sterile technique and under real time ultrasound guidance: 40 mg of Kenalog and 2 mL of Marcaine injected into subacromial bursa. Fluid seen entering the bursa.   Completed without difficulty   Pain immediately resolved suggesting accurate placement of the medication.   Advised to call if fevers/chills, erythema, induration, drainage, or persistent bleeding.   Images permanently stored and available for review in the ultrasound unit.  Impression: Technically successful ultrasound guided injection.   X-ray images left shoulder and left hand obtained today personally and independently interpreted  Left shoulder: Mild glenohumeral DJD.  No acute fractures.  Left hand: Mild DJD first CMC.  No acute fractures.  Await formal radiology review      Assessment and Plan: 61 y.o. female with left shoulder pain due to subacromial bursitis.  Plan for subacromial injection and exercise program previously taught. Reassess in 1 month.  Can proceed with left thumb injection at that time if needed.   PDMP not reviewed this encounter. Orders Placed This Encounter  Procedures   [place] LIMITED JOINT SPACE STRUCTURES UP LEFT(NO LINKED CHARGES)    Order Specific Question:   Reason for Exam (SYMPTOM  OR DIAGNOSIS REQUIRED)    Answer:   L shoulder pain    Order Specific Question:   Preferred imaging location?    Answer:   [facility]   DG Shoulder Left    Standing Status:   Future    Number of Occurrences:   1    Standing Expiration Date:   05/30/2021  Order Specific Question:   Reason for Exam (SYMPTOM  OR DIAGNOSIS REQUIRED)    Answer:   L shoulder pain    Order Specific Question:   Preferred imaging location?    Answer:   [facility]   DG Hand Complete Left    Standing Status:   Future    Number of Occurrences:   1    Standing Expiration Date:   04/29/2022    Order Specific Question:   Reason  for Exam (SYMPTOM  OR DIAGNOSIS REQUIRED)    Answer:   left hand pain    Order Specific Question:   Preferred imaging location?    Answer:   [facility]   No orders of the defined types were placed in this encounter.    Discussed warning signs or symptoms. Please see discharge instructions. Patient expresses understanding.   The above documentation has been reviewed and is accurate and complete [name], M.D.

## 2021-04-29 NOTE — Patient Instructions (Addendum)
Thank you for coming in today.   Please get an Xray today before you leave.  You had a R shoulder injection today.  Call or go to the ER if you develop a large red swollen joint with extreme pain or oozing puss.   Follow-up in one month.

## 2021-05-01 NOTE — Progress Notes (Signed)
Left shoulder x-ray shows mild arthritis changes.

## 2021-05-01 NOTE — Progress Notes (Signed)
Left hand x-ray does show a possible wrist injury in the past does not show an obvious thumb injury.  We can talk about this in more detail on November 10 on your follow-up visit.  If you are hurting recommend using a thumb spica wrist brace

## 2021-05-24 ENCOUNTER — Other Ambulatory Visit: Payer: Self-pay | Admitting: Internal Medicine

## 2021-05-24 DIAGNOSIS — Z1231 Encounter for screening mammogram for malignant neoplasm of breast: Secondary | ICD-10-CM

## 2021-05-29 NOTE — Progress Notes (Signed)
   I, [name], LAT, ATC, am serving as scribe for Dr. [name].  [name] is a 60 y.o. female who presents to [place] at [facility] today for  f/u of chronic L shoulder, L hand, and L foot pain.  She was last seen by Dr. Georgina Snell on 04/29/21 and was given a L subacromial steroid injection and was advised to cont working on HEP. Today, pt reports that her L shoulder is doing ok.  She states that she never actually got a HEP.  She has been swimming and working on overhead ROM and also doing some active assisted ROM in the pool.  Her L hand and foot are improving.  Dx imaging: 04/29/21 L hand & L shoulder XR  Pertinent review of systems: No fevers or chills  Relevant historical information: Hypertension.  Obesity.   Exam:  BP 128/78 (BP Location: Left Arm, Patient Position: Sitting, Cuff Size: Large)   Pulse 90   Ht 5\' 3"  (1.6 m)   Wt 294 lb 9.6 oz (133.6 kg)   SpO2 97%   BMI 52.19 kg/m  General: Well Developed, well nourished, and in no acute distress.   MSK: Left shoulder normal-appearing normal motion.  Pain with abduction.    Lab and Radiology Results  EXAM: LEFT SHOULDER - 2+ VIEW   COMPARISON:  None.   FINDINGS: Three view radiograph left shoulder demonstrates normal alignment. No fracture or dislocation. Acromioclavicular joint space is not well profiled. Least mild glenohumeral degenerative arthritis with osteophyte formation noted. Limited evaluation of the left hemithorax is unremarkable.   IMPRESSION: Mild degenerative change.  No acute fracture or dislocation.     Electronically Signed   By: Fidela Salisbury M.D.   On: 05/01/2021 01:41 I, [name], personally (independently) visualized and performed the interpretation of the images attached in this note.      Assessment and Plan: 61 y.o. female with left shoulder pain.  Patient had subacromial bursa injection 1 month ago with no lasting improvement.  We discussed treatment  options.  Patient would prefer not to go to physical therapy.  Home exercise program taught in clinic today by ATC.  She previously has been given a handout on the exercise program but was not doing much. We discussed additionally other potential next steps including MRI for potential surgical or other injection planning or if the pain is not that bad watchful waiting. She would like to try the home exercises on her own and let me know how it goes.  Check back as needed.     Discussed warning signs or symptoms. Please see discharge instructions. Patient expresses understanding.   The above documentation has been reviewed and is accurate and complete [name], M.D.  Total encounter time 20 minutes including face-to-face time with the patient and, reviewing past medical record, and charting on the date of service.   Treatment plan and options.

## 2021-05-30 ENCOUNTER — Encounter: Payer: Self-pay | Admitting: Family Medicine

## 2021-05-30 ENCOUNTER — Other Ambulatory Visit: Payer: Self-pay

## 2021-05-30 ENCOUNTER — Ambulatory Visit: Payer: Medicare (Managed Care) | Admitting: Family Medicine

## 2021-05-30 VITALS — BP 128/78 | HR 90 | Ht 63.0 in | Wt 294.6 lb

## 2021-05-30 DIAGNOSIS — M25512 Pain in left shoulder: Secondary | ICD-10-CM

## 2021-05-30 DIAGNOSIS — G8929 Other chronic pain: Secondary | ICD-10-CM | POA: Diagnosis not present

## 2021-05-30 NOTE — Patient Instructions (Addendum)
Good to see you today.  Please perform the exercise program that we have prepared for you and gone over in detail on a daily basis.  In addition to the handout you were provided you can access your program through: [email or web address]   Your unique program code is:   [email or web address]  Follow-up: as needed

## 2021-07-04 ENCOUNTER — Ambulatory Visit
Admission: RE | Admit: 2021-07-04 | Discharge: 2021-07-04 | Disposition: A | Discharge status: Home / Self Care | Payer: Medicare (Managed Care) | Source: Ambulatory Visit | Attending: Internal Medicine | Admitting: Internal Medicine

## 2021-07-04 DIAGNOSIS — Z1231 Encounter for screening mammogram for malignant neoplasm of breast: Secondary | ICD-10-CM

## 2021-09-23 DIAGNOSIS — Z0000 Encounter for general adult medical examination without abnormal findings: Secondary | ICD-10-CM | POA: Diagnosis not present

## 2021-09-23 DIAGNOSIS — R011 Cardiac murmur, unspecified: Secondary | ICD-10-CM | POA: Diagnosis not present

## 2021-09-23 DIAGNOSIS — Z6841 Body Mass Index (BMI) 40.0 and over, adult: Secondary | ICD-10-CM | POA: Diagnosis not present

## 2021-10-14 ENCOUNTER — Encounter: Payer: Self-pay | Admitting: Internal Medicine

## 2021-10-14 ENCOUNTER — Other Ambulatory Visit: Payer: Self-pay

## 2021-10-14 ENCOUNTER — Ambulatory Visit (INDEPENDENT_AMBULATORY_CARE_PROVIDER_SITE_OTHER): Payer: Medicare (Managed Care) | Admitting: Internal Medicine

## 2021-10-14 VITALS — BP 124/80 | HR 81 | Resp 18 | Ht 63.0 in | Wt 298.2 lb

## 2021-10-14 DIAGNOSIS — J3089 Other allergic rhinitis: Secondary | ICD-10-CM

## 2021-10-14 DIAGNOSIS — Z0000 Encounter for general adult medical examination without abnormal findings: Secondary | ICD-10-CM

## 2021-10-14 DIAGNOSIS — I10 Essential (primary) hypertension: Secondary | ICD-10-CM

## 2021-10-14 DIAGNOSIS — R7303 Prediabetes: Secondary | ICD-10-CM | POA: Diagnosis not present

## 2021-10-14 LAB — COMPREHENSIVE METABOLIC PANEL WITH GFR
ALT: 11 U/L (ref 0–35)
AST: 15 U/L (ref 0–37)
Albumin: 4.1 g/dL (ref 3.5–5.2)
Alkaline Phosphatase: 123 U/L — ABNORMAL HIGH (ref 39–117)
BUN: 12 mg/dL (ref 6–23)
CO2: 31 meq/L (ref 19–32)
Calcium: 9.7 mg/dL (ref 8.4–10.5)
Chloride: 99 meq/L (ref 96–112)
Creatinine, Ser: 0.83 mg/dL (ref 0.40–1.20)
GFR: 75.99 mL/min
Glucose, Bld: 100 mg/dL — ABNORMAL HIGH (ref 70–99)
Potassium: 3.5 meq/L (ref 3.5–5.1)
Sodium: 137 meq/L (ref 135–145)
Total Bilirubin: 0.5 mg/dL (ref 0.2–1.2)
Total Protein: 8.1 g/dL (ref 6.0–8.3)

## 2021-10-14 LAB — LIPID PANEL
Cholesterol: 105 mg/dL (ref 0–200)
HDL: 40.2 mg/dL (ref >39.00)
LDL Cholesterol: 46 mg/dL (ref 0–99)
NonHDL: 64.99
Total CHOL/HDL Ratio: 3
Triglycerides: 95 mg/dL (ref 0.0–149.0)
VLDL: 19 mg/dL (ref 0.0–40.0)

## 2021-10-14 LAB — CBC
HCT: 37.6 % (ref 36.0–46.0)
Hemoglobin: 12.3 g/dL (ref 12.0–15.0)
MCHC: 32.8 g/dL (ref 30.0–36.0)
MCV: 85.4 fl (ref 78.0–100.0)
Platelets: 252 10*3/uL (ref 150.0–400.0)
RBC: 4.4 Mil/uL (ref 3.87–5.11)
RDW: 13.4 % (ref 11.5–15.5)
WBC: 8.8 10*3/uL (ref 4.0–10.5)

## 2021-10-14 LAB — HEMOGLOBIN A1C: Hgb A1c MFr Bld: 5.9 % (ref 4.6–6.5)

## 2021-10-14 NOTE — Patient Instructions (Signed)
We will check the labs today. 

## 2021-10-14 NOTE — Progress Notes (Signed)
? ?Subjective:  ? ?Patient ID: [name], female    DOB: [birth year], 61 y.o.   MRN: [id number] ? ?HPI ?Here for medicare wellness and physical, no new complaints. Please see A/P for status and treatment of chronic medical problems.  ? ?Diet: heart healthy ?Physical activity: gym 3-5 times a week ?Depression/mood screen: negative ?Hearing: intact to whispered voice ?Visual acuity: grossly normal with lens, performs annual eye exam  ?ADLs: capable ?Fall risk: none ?Home safety: good ?Cognitive evaluation: intact to orientation, naming, recall and repetition ?EOL planning: adv directives discussed ? ?[facility] [place] Office Visit from 10/14/2021 in [place] [facility] [place]  ?PHQ-2 Total Score 0  ? ?  ?  ? ? ?  09/26/2018  ?  6:28 AM 09/26/2018  ? 11:00 PM 09/27/2018  ? 12:39 AM 09/27/2018  ?  7:45 AM 09/27/2018  ?  8:15 PM  ?Fall Risk  ?Patient Fall Risk Level Low fall risk Low fall risk Low fall risk Moderate fall risk Low fall risk  ? ? ?I have personally reviewed and have noted ?1. The patient's medical and social history - reviewed today no changes ?2. Their use of alcohol, tobacco or illicit drugs ?3. Their current medications and supplements ?4. The patient's functional ability including ADL's, fall risks, home safety risks and hearing or visual impairment. ?5. Diet and physical activities ?6. Evidence for depression or mood disorders ?7. Care team reviewed and updated ?8.  The patient is not on an opioid pain medication. ? ?Patient Care Team: ?Hoyt Koch, MD as PCP - General (Internal Medicine) ?Past Medical History:  ?Diagnosis Date  ? Bronchitis   ? Complication of anesthesia   ? hard to wake up years ago  ? Environmental and seasonal allergies   ? dust mites, dust, cats,trees,  ? GERD (gastroesophageal reflux disease)   ? Headache   ? Heart murmur   ? Hypertension   ? Shortness of breath dyspnea   ? with exertion  ? ?Past Surgical History:  ?Procedure Laterality Date  ? ABDOMINAL HYSTERECTOMY    ?  CHOLECYSTECTOMY N/A 09/27/2018  ? Procedure: LAPAROSCOPIC CHOLECYSTECTOMY;  Surgeon: Erroll Luna, MD;  Location: [facility];  Service: General;  Laterality: N/A;  ? CYST REMOVAL HAND    ? NASAL SINUS SURGERY    ? ORIF ANKLE FRACTURE Right 07/30/2015  ? Procedure: OPEN REDUCTION INTERNAL FIXATION (ORIF) RIGHT  ANKLE ;  Surgeon: Rod Can, MD;  Location: [facility];  Service: Orthopedics;  Laterality: Right;  ? ?History reviewed. No pertinent family history. ? ? ?Review of Systems  ?Constitutional: Negative.   ?HENT: Negative.    ?Eyes: Negative.   ?Respiratory:  Negative for cough, chest tightness and shortness of breath.   ?Cardiovascular:  Negative for chest pain, palpitations and leg swelling.  ?Gastrointestinal:  Negative for abdominal distention, abdominal pain, constipation, diarrhea, nausea and vomiting.  ?Musculoskeletal: Negative.   ?Skin: Negative.   ?Neurological: Negative.   ?Psychiatric/Behavioral: Negative.    ? ?Objective:  ?Physical Exam ?Constitutional:   ?   Appearance: She is well-developed. She is obese.  ?HENT:  ?   Head: Normocephalic and atraumatic.  ?Cardiovascular:  ?   Rate and Rhythm: Normal rate and regular rhythm.  ?Pulmonary:  ?   Effort: Pulmonary effort is normal. No respiratory distress.  ?   Breath sounds: Normal breath sounds. No wheezing or rales.  ?Abdominal:  ?   General: Bowel sounds are normal. There is no distension.  ?   Palpations:  Abdomen is soft.  ?   Tenderness: There is no abdominal tenderness. There is no rebound.  ?Musculoskeletal:  ?   Cervical back: Normal range of motion.  ?Skin: ?   General: Skin is warm and dry.  ?Neurological:  ?   Mental Status: She is alert and oriented to person, place, and time.  ?   Coordination: Coordination normal.  ? ? ?Vitals:  ? 10/14/21 1257  ?BP: 124/80  ?Pulse: 81  ?Resp: 18  ?SpO2: 100%  ?Weight: 298 lb 3.2 oz (135.3 kg)  ?Height: '5\' 3"'$  (1.6 m)  ? ? ?This visit occurred during the SARS-CoV-2 public health emergency.  Safety protocols  were in place, including screening questions prior to the visit, additional usage of staff PPE, and extensive cleaning of exam room while observing appropriate contact time as indicated for disinfecting solutions.  ? ?Assessment & Plan:  ? ?

## 2021-10-16 ENCOUNTER — Encounter: Payer: Self-pay | Admitting: Internal Medicine

## 2021-10-16 NOTE — Assessment & Plan Note (Signed)
BP at goal on telmisartan/hctz 80/25 mg daily. Checking CMP and adjust as needed otherwise continue. ?

## 2021-10-16 NOTE — Assessment & Plan Note (Signed)
Flu shot up to date. Covid-19 up to date. Pneumonia complete. Shingrix complete. Tetanus due 2024. Colonoscopy due 2024. Mammogram due 2024, pap smear complete and dexa complete. Counseled about sun safety and mole surveillance. Counseled about the dangers of distracted driving. Given 10 year screening recommendations.  ? ?

## 2021-10-16 NOTE — Assessment & Plan Note (Signed)
Counseled about weight and diet/exercise.  ?

## 2021-10-16 NOTE — Assessment & Plan Note (Signed)
Severe and using montelukast and flonase and cromolyn and xyzal for allergy season with decent results.  ?

## 2021-11-18 ENCOUNTER — Other Ambulatory Visit: Payer: Self-pay | Admitting: Internal Medicine

## 2022-04-18 DIAGNOSIS — Z0000 Encounter for general adult medical examination without abnormal findings: Secondary | ICD-10-CM | POA: Diagnosis not present

## 2022-05-26 ENCOUNTER — Other Ambulatory Visit: Payer: Self-pay | Admitting: Internal Medicine

## 2022-05-26 DIAGNOSIS — Z1231 Encounter for screening mammogram for malignant neoplasm of breast: Secondary | ICD-10-CM

## 2022-07-25 ENCOUNTER — Ambulatory Visit
Admission: RE | Admit: 2022-07-25 | Discharge: 2022-07-25 | Disposition: A | Discharge status: Home / Self Care | Payer: Medicare (Managed Care) | Source: Ambulatory Visit | Attending: Internal Medicine | Admitting: Internal Medicine

## 2022-07-25 DIAGNOSIS — Z1231 Encounter for screening mammogram for malignant neoplasm of breast: Secondary | ICD-10-CM

## 2022-07-25 LAB — HM MAMMOGRAPHY

## 2022-10-08 ENCOUNTER — Telehealth: Payer: Self-pay

## 2022-10-08 NOTE — Telephone Encounter (Signed)
Called patient to schedule Medicare Annual Wellness Visit (AWV). Unable to reach patient.  Last date of AWV: 10/14/21  Please schedule an appointment at any time with [facility].   Norton Blizzard, [place] ([facility])  [place] [facility] [phone]

## 2022-10-09 ENCOUNTER — Telehealth: Payer: Self-pay

## 2022-10-09 NOTE — Telephone Encounter (Signed)
Contacted [name] to schedule their annual wellness visit. Appointment made for 10/16/22.  Norton Blizzard, [place] ([facility])  [place] [facility] [phone]

## 2022-10-16 ENCOUNTER — Ambulatory Visit (INDEPENDENT_AMBULATORY_CARE_PROVIDER_SITE_OTHER): Payer: Medicare (Managed Care)

## 2022-10-16 VITALS — Ht 63.0 in | Wt 298.0 lb

## 2022-10-16 DIAGNOSIS — Z0000 Encounter for general adult medical examination without abnormal findings: Secondary | ICD-10-CM

## 2022-10-16 NOTE — Progress Notes (Signed)
I connected with  [name] on 10/16/22 by a audio enabled telemedicine application and verified that I am speaking with the correct person using two identifiers.  Patient Location: Home  Provider Location: Office/Clinic  I discussed the limitations of evaluation and management by telemedicine. The patient expressed understanding and agreed to proceed.   Subjective:   [name] is a 62 y.o. female who presents for Medicare Annual (Subsequent) preventive examination.  Review of Systems     Cardiac Risk Factors include: advanced age (>7men, >73 women);hypertension;obesity (BMI >30kg/m2)     Objective:    Today's Vitals   10/16/22 1302  Weight: 298 lb (135.2 kg)  Height: 5\' 3"  (1.6 m)  PainSc: 6   PainLoc: Shoulder   Body mass index is 52.79 kg/m.     10/16/2022    1:04 PM 09/27/2018   12:42 AM 07/30/2015    9:15 PM 07/27/2015    2:03 PM 07/15/2015   12:00 PM 04/05/2014    7:08 PM  Advanced Directives  Does Patient Have a Medical Advance Directive? No No No No No No  Would patient like information on creating a medical advance directive? No - Patient declined No - Patient declined No - patient declined information No - patient declined information No - patient declined information No - patient declined information    Current Medications (verified) Outpatient Encounter Medications as of 10/16/2022  Medication Sig   acetaminophen (TYLENOL) 500 MG tablet Take 1,000 mg by mouth every 6 (six) hours as needed for mild pain or headache.   cromolyn (NASALCROM) 5.2 MG/ACT nasal spray Place 1 spray into both nostrils 4 (four) times daily.   fluticasone (FLONASE) 50 MCG/ACT nasal spray Place 2 sprays into both nostrils daily as needed for allergies.    ibuprofen (ADVIL,MOTRIN) 200 MG tablet Take 400 mg by mouth every 6 (six) hours as needed for moderate pain.   levocetirizine (XYZAL) 5 MG tablet Take 5 mg by mouth every evening. Reported on 11/12/2015   montelukast (SINGULAIR) 10  MG tablet Take 10 mg by mouth every evening.   telmisartan-hydrochlorothiazide (MICARDIS HCT) 80-25 MG tablet TAKE 1 TABLET BY MOUTH DAILY. PLEASE CALL OUR OFFICE TO SCHEDULE A FOLLOW UP   triamcinolone cream (KENALOG) 0.1 % APPLY TO AFFECTED AREA TWICE A DAY   TURMERIC CURCUMIN PO Take 500 mg by mouth daily.    No facility-administered encounter medications on file as of 10/16/2022.    Allergies (verified) Penicillins   History: Past Medical History:  Diagnosis Date   Bronchitis    Complication of anesthesia    hard to wake up years ago   Environmental and seasonal allergies    dust mites, dust, cats,trees,   GERD (gastroesophageal reflux disease)    Headache    Heart murmur    Hypertension    Shortness of breath dyspnea    with exertion   Past Surgical History:  Procedure Laterality Date   ABDOMINAL HYSTERECTOMY     CHOLECYSTECTOMY N/A 09/27/2018   Procedure: LAPAROSCOPIC CHOLECYSTECTOMY;  Surgeon: Erroll Luna, MD;  Location: [place];  Service: General;  Laterality: N/A;   CYST REMOVAL HAND     NASAL SINUS SURGERY     ORIF ANKLE FRACTURE Right 07/30/2015   Procedure: OPEN REDUCTION INTERNAL FIXATION (ORIF) RIGHT  ANKLE ;  Surgeon: Rod Can, MD;  Location: [facility];  Service: Orthopedics;  Laterality: Right;   No family history on file. Social History   Socioeconomic History   Marital  status: Married    Spouse name: Not on file   Number of children: Not on file   Years of education: Not on file   Highest education level: Not on file  Occupational History   Occupation: Housewife     Employer: [employer]  Tobacco Use   Smoking status: Former    Packs/day: 1.00    Years: 35.00    Additional pack years: 0.00    Total pack years: 35.00    Types: Cigarettes    Quit date: 09/18/2013    Years since quitting: 9.0   Smokeless tobacco: Never  Substance and Sexual Activity   Alcohol use: No   Drug use: No   Sexual activity: Not on file  Other Topics Concern    Not on file  Social History Narrative   Was in the [employer] x1 year   Social Determinants of Health   Financial Resource Strain: Low Risk  (10/16/2022)   Overall Financial Resource Strain (CARDIA)    Difficulty of Paying Living Expenses: Not hard at all  Food Insecurity: No Food Insecurity (10/16/2022)   Hunger Vital Sign    Worried About Running Out of Food in the Last Year: Never true    [place] in the Last Year: Never true  Transportation Needs: No Transportation Needs (10/16/2022)   PRAPARE - Hydrologist (Medical): No    Lack of Transportation (Non-Medical): No  Physical Activity: Sufficiently Active (10/16/2022)   Exercise Vital Sign    Days of Exercise per Week: 5 days    Minutes of Exercise per Session: 60 min  Stress: No Stress Concern Present (10/16/2022)   [place]    Feeling of Stress : Not at all  Social Connections: [place] (10/16/2022)   Social Connection and Isolation Panel [NHANES]    Frequency of Communication with Friends and Family: More than three times a week    Frequency of Social Gatherings with Friends and Family: More than three times a week    Attends Religious Services: More than 4 times per year    Active Member of [employer] or Organizations: Yes    Attends Music therapist: More than 4 times per year    Marital Status: Married    Tobacco Counseling Counseling given: Not Answered   Clinical Intake:  Pre-visit preparation completed: Yes  Pain : No/denies pain Pain Score: 6      BMI - recorded: 52.79 Nutritional Status: BMI > 30  Obese Nutritional Risks: None Diabetes: No  How often do you need to have someone help you when you read instructions, pamphlets, or other written materials from your doctor or pharmacy?: 1 - Never What is the last grade level you completed in school?: HSG  Diabetic? No  Interpreter  Needed?: No  Information entered by :: Lisette Abu, LPN.   Activities of Daily Living    10/16/2022    1:07 PM  In your present state of health, do you have any difficulty performing the following activities:  Hearing? 0  Vision? 0  Difficulty concentrating or making decisions? 0  Walking or climbing stairs? 0  Dressing or bathing? 0  Doing errands, shopping? 0  Preparing Food and eating ? N  Using the Toilet? N  In the past six months, have you accidently leaked urine? N  Do you have problems with loss of bowel control? N  Managing your Medications? N  Managing your Finances? N  Housekeeping or managing your Housekeeping? N    Patient Care Team: Hoyt Koch, MD as PCP - General (Internal Medicine)  Indicate any recent Medical Services you may have received from other than [facility] providers in the past year (date may be approximate).     Assessment:   This is a routine wellness examination for [name].  Hearing/Vision screen Hearing Screening - Comments:: Denies hearing difficulties   Vision Screening - Comments:: Wears rx glasses - up to date with routine eye exams with [facility]   Dietary issues and exercise activities discussed: Current Exercise Habits: Home exercise routine, Type of exercise: walking;strength training/weights;Other - see comments (water aerobics), Time (Minutes): > 60 (2 hours for 5 days a week), Frequency (Times/Week): 5, Weekly Exercise (Minutes/Week): 0, Intensity: Moderate, Exercise limited by: None identified   Goals Addressed             This Visit's Progress    My goal for 2024 is to lose weight.  My ideal weight goal is to be 190 pounds.        Depression Screen    10/16/2022    1:07 PM 10/14/2021    1:02 PM 10/11/2020   12:54 PM 08/25/2019    8:59 AM 08/02/2018    1:52 PM 07/27/2017    9:06 AM  PHQ 2/9 Scores  PHQ - 2 Score 0 0 0 0 0 0  PHQ- 9 Score 0         Fall Risk    10/16/2022    1:06 PM  [place] in the past year? 0  Number falls in past yr: 0  Injury with Fall? 0  Risk for fall due to : No Fall Risks  Follow up Falls prevention discussed    [place]:  Any stairs in or around the home? No  If so, are there any without handrails? No  Home free of loose throw rugs in walkways, pet beds, electrical cords, etc? Yes  Adequate lighting in your home to reduce risk of falls? Yes   ASSISTIVE DEVICES UTILIZED TO PREVENT FALLS:  Life alert? No  Use of a cane, walker or w/c? No  Grab bars in the bathroom? No  Shower chair or bench in shower? Yes  Elevated toilet seat or a handicapped toilet? Yes   TIMED UP AND GO:  Was the test performed? No . Telephonic Visit   Cognitive Function:        10/16/2022    1:08 PM  6CIT Screen  What Year? 0 points  What month? 0 points  What time? 0 points  Count back from 20 0 points  Months in reverse 0 points  Repeat phrase 0 points  Total Score 0 points    Immunizations Immunization History  Administered Date(s) Administered   Influenza Inj Mdck Quad Pf 03/25/2020   Influenza,inj,Quad PF,6+ Mos 07/01/2018, 05/08/2019, 04/26/2021   PFIZER(Purple Top)SARS-COV-2 Vaccination 01/31/2020, 03/20/2020, 07/06/2020   Pfizer Covid-19 Vaccine Bivalent Booster 79yrs & up 04/26/2021   Tdap 07/21/2012   Zoster Recombinat (Shingrix) 10/19/2017, 02/22/2018    TDAP status: Due, Education has been provided regarding the importance of this vaccine. Advised may receive this vaccine at local pharmacy or Health Dept. Aware to provide a copy of the vaccination record if obtained from local pharmacy or Health Dept. Verbalized acceptance and understanding.  Flu Vaccine status: Due, Education has been provided regarding the importance of  this vaccine. Advised may receive this vaccine at local pharmacy or Health Dept. Aware to provide a copy of the vaccination record if obtained from local pharmacy or Health Dept.  Verbalized acceptance and understanding.  Pneumococcal vaccine status: Due, Education has been provided regarding the importance of this vaccine. Advised may receive this vaccine at local pharmacy or Health Dept. Aware to provide a copy of the vaccination record if obtained from local pharmacy or Health Dept. Verbalized acceptance and understanding.  Covid-19 vaccine status: Completed vaccines  Qualifies for Shingles Vaccine? Yes   Zostavax completed No   Shingrix Completed?: Yes  Screening Tests Health Maintenance  Topic Date Due   Lung Cancer Screening  Never done   INFLUENZA VACCINE  02/18/2022   COVID-19 Vaccine (5 - 2023-24 season) 03/21/2022   DTaP/Tdap/Td (2 - Td or Tdap) 07/21/2022   COLONOSCOPY (Pts 45-68yrs Insurance coverage will need to be confirmed)  07/21/2022   Medicare Annual Wellness (AWV)  10/16/2023   MAMMOGRAM  07/25/2024   Hepatitis C Screening  Completed   HIV Screening  Completed   Zoster Vaccines- Shingrix  Completed   HPV VACCINES  Aged Out    Health Maintenance  Health Maintenance Due  Topic Date Due   Lung Cancer Screening  Never done   INFLUENZA VACCINE  02/18/2022   COVID-19 Vaccine (5 - 2023-24 season) 03/21/2022   DTaP/Tdap/Td (2 - Td or Tdap) 07/21/2022   COLONOSCOPY (Pts 45-65yrs Insurance coverage will need to be confirmed)  07/21/2022    Colorectal cancer screening: Type of screening: Colonoscopy. Completed 07/21/2012. Repeat every 10 years  Mammogram status: Completed 07/25/2022. Repeat every year  Bone Density status: Completed 12/18/2015. Results reflect: Bone density results: NORMAL. Repeat every 5 years.  Lung Cancer Screening: (Low Dose CT Chest recommended if Age 66-80 years, 30 pack-year currently smoking OR have quit w/in 15years.) does qualify.   Lung Cancer Screening Referral: no  Additional Screening:  Hepatitis C Screening: does qualify; Completed 05/14/2016  Vision Screening: Recommended annual ophthalmology exams for  early detection of glaucoma and other disorders of the eye. Is the patient up to date with their annual eye exam?  Yes  Who is the provider or what is the name of the office in which the patient attends annual eye exams? [facility] If pt is not established with a provider, would they like to be referred to a provider to establish care? No .   Dental Screening: Recommended annual dental exams for proper oral hygiene  Community Resource Referral / Chronic Care Management: CRR required this visit?  No   CCM required this visit?  No      Plan:     I have personally reviewed and noted the following in the patient's chart:   Medical and social history Use of alcohol, tobacco or illicit drugs  Current medications and supplements including opioid prescriptions. Patient is not currently taking opioid prescriptions. Functional ability and status Nutritional status Physical activity Advanced directives List of other physicians Hospitalizations, surgeries, and ER visits in previous 12 months Vitals Screenings to include cognitive, depression, and falls Referrals and appointments  In addition, I have reviewed and discussed with patient certain preventive protocols, quality metrics, and best practice recommendations. A written personalized care plan for preventive services as well as general preventive health recommendations were provided to patient.     Sheral Flow, LPN   [id number]   Nurse Notes:  Normal cognitive status assessed by direct observation by this Nurse Health  Advisor. No abnormalities found.

## 2022-10-16 NOTE — Patient Instructions (Addendum)
[name] , Thank you for taking time to come for your Medicare Wellness Visit. I appreciate your ongoing commitment to your health goals. Please review the following plan we discussed and let me know if I can assist you in the future.   These are the goals we discussed:  Goals      My goal for 2024 is to lose weight.  My ideal weight goal is to be 190 pounds.        This is a list of the screening recommended for you and due dates:  Health Maintenance  Topic Date Due   Screening for Lung Cancer  Never done   Flu Shot  02/18/2022   COVID-19 Vaccine (5 - 2023-24 season) 03/21/2022   DTaP/Tdap/Td vaccine (2 - Td or Tdap) 07/21/2022   Colon Cancer Screening  07/21/2022   Medicare Annual Wellness Visit  10/16/2023   Mammogram  07/25/2024   Hepatitis C Screening: USPSTF Recommendation to screen - Ages 18-79 yo.  Completed   HIV Screening  Completed   Zoster (Shingles) Vaccine  Completed   HPV Vaccine  Aged Out    Advanced directives: No  Conditions/risks identified: Yes  Next appointment: Follow up in one year for your annual wellness visit.   Preventive Care 40-64 Years, Female Preventive care refers to lifestyle choices and visits with your health care provider that can promote health and wellness. What does preventive care include? A yearly physical exam. This is also called an annual well check. Dental exams once or twice a year. Routine eye exams. Ask your health care provider how often you should have your eyes checked. Personal lifestyle choices, including: Daily care of your teeth and gums. Regular physical activity. Eating a healthy diet. Avoiding tobacco and drug use. Limiting alcohol use. Practicing safe sex. Taking low-dose aspirin daily starting at age 10. Taking vitamin and mineral supplements as recommended by your health care provider. What happens during an annual well check? The services and screenings done by your health care provider during your annual  well check will depend on your age, overall health, lifestyle risk factors, and family history of disease. Counseling  Your health care provider may ask you questions about your: Alcohol use. Tobacco use. Drug use. Emotional well-being. Home and relationship well-being. Sexual activity. Eating habits. Work and work Statistician. Method of birth control. Menstrual cycle. Pregnancy history. Screening  You may have the following tests or measurements: Height, weight, and BMI. Blood pressure. Lipid and cholesterol levels. These may be checked every 5 years, or more frequently if you are over 64 years old. Skin check. Lung cancer screening. You may have this screening every year starting at age 35 if you have a 30-pack-year history of smoking and currently smoke or have quit within the past 15 years. Fecal occult blood test (FOBT) of the stool. You may have this test every year starting at age 25. Flexible sigmoidoscopy or colonoscopy. You may have a sigmoidoscopy every 5 years or a colonoscopy every 10 years starting at age 66. Hepatitis C blood test. Hepatitis B blood test. Sexually transmitted disease (STD) testing. Diabetes screening. This is done by checking your blood sugar (glucose) after you have not eaten for a while (fasting). You may have this done every 1-3 years. Mammogram. This may be done every 1-2 years. Talk to your health care provider about when you should start having regular mammograms. This may depend on whether you have a family history of breast cancer. BRCA-related cancer screening.  This may be done if you have a family history of breast, ovarian, tubal, or peritoneal cancers. Pelvic exam and Pap test. This may be done every 3 years starting at age 20. Starting at age 21, this may be done every 5 years if you have a Pap test in combination with an HPV test. Bone density scan. This is done to screen for osteoporosis. You may have this scan if you are at high risk for  osteoporosis. Discuss your test results, treatment options, and if necessary, the need for more tests with your health care provider. Vaccines  Your health care provider may recommend certain vaccines, such as: Influenza vaccine. This is recommended every year. Tetanus, diphtheria, and acellular pertussis (Tdap, Td) vaccine. You may need a Td booster every 10 years. Zoster vaccine. You may need this after age 33. Pneumococcal 13-valent conjugate (PCV13) vaccine. You may need this if you have certain conditions and were not previously vaccinated. Pneumococcal polysaccharide (PPSV23) vaccine. You may need one or two doses if you smoke cigarettes or if you have certain conditions. Talk to your health care provider about which screenings and vaccines you need and how often you need them. This information is not intended to replace advice given to you by your health care provider. Make sure you discuss any questions you have with your health care provider. Document Released: 08/03/2015 Document Revised: 03/26/2016 Document Reviewed: 05/08/2015 Elsevier Interactive Patient Education  2017 [place] Prevention in the Home Falls can cause injuries. They can happen to people of all ages. There are many things you can do to make your home safe and to help prevent falls. What can I do on the outside of my home? Regularly fix the edges of walkways and driveways and fix any cracks. Remove anything that might make you trip as you walk through a door, such as a raised step or threshold. Trim any bushes or trees on the path to your home. Use bright outdoor lighting. Clear any walking paths of anything that might make someone trip, such as rocks or tools. Regularly check to see if handrails are loose or broken. Make sure that both sides of any steps have handrails. Any raised decks and porches should have guardrails on the edges. Have any leaves, snow, or ice cleared regularly. Use sand or  salt on walking paths during winter. Clean up any spills in your garage right away. This includes oil or grease spills. What can I do in the bathroom? Use night lights. Install grab bars by the toilet and in the tub and shower. Do not use towel bars as grab bars. Use non-skid mats or decals in the tub or shower. If you need to sit down in the shower, use a plastic, non-slip stool. Keep the floor dry. Clean up any water that spills on the floor as soon as it happens. Remove soap buildup in the tub or shower regularly. Attach bath mats securely with double-sided non-slip rug tape. Do not have throw rugs and other things on the floor that can make you trip. What can I do in the bedroom? Use night lights. Make sure that you have a light by your bed that is easy to reach. Do not use any sheets or blankets that are too big for your bed. They should not hang down onto the floor. Have a firm chair that has side arms. You can use this for support while you get dressed. Do not have throw rugs and  other things on the floor that can make you trip. What can I do in the kitchen? Clean up any spills right away. Avoid walking on wet floors. Keep items that you use a lot in easy-to-reach places. If you need to reach something above you, use a strong step stool that has a grab bar. Keep electrical cords out of the way. Do not use floor polish or wax that makes floors slippery. If you must use wax, use non-skid floor wax. Do not have throw rugs and other things on the floor that can make you trip. What can I do with my stairs? Do not leave any items on the stairs. Make sure that there are handrails on both sides of the stairs and use them. Fix handrails that are broken or loose. Make sure that handrails are as long as the stairways. Check any carpeting to make sure that it is firmly attached to the stairs. Fix any carpet that is loose or worn. Avoid having throw rugs at the top or bottom of the stairs. If  you do have throw rugs, attach them to the floor with carpet tape. Make sure that you have a light switch at the top of the stairs and the bottom of the stairs. If you do not have them, ask someone to add them for you. What else can I do to help prevent falls? Wear shoes that: Do not have high heels. Have rubber bottoms. Are comfortable and fit you well. Are closed at the toe. Do not wear sandals. If you use a stepladder: Make sure that it is fully opened. Do not climb a closed stepladder. Make sure that both sides of the stepladder are locked into place. Ask someone to hold it for you, if possible. Clearly mark and make sure that you can see: Any grab bars or handrails. First and last steps. Where the edge of each step is. Use tools that help you move around (mobility aids) if they are needed. These include: Canes. Walkers. Scooters. Crutches. Turn on the lights when you go into a dark area. Replace any light bulbs as soon as they burn out. Set up your furniture so you have a clear path. Avoid moving your furniture around. If any of your floors are uneven, fix them. If there are any pets around you, be aware of where they are. Review your medicines with your doctor. Some medicines can make you feel dizzy. This can increase your chance of falling. Ask your doctor what other things that you can do to help prevent falls. This information is not intended to replace advice given to you by your health care provider. Make sure you discuss any questions you have with your health care provider. Document Released: 05/03/2009 Document Revised: 12/13/2015 Document Reviewed: 08/11/2014 Elsevier Interactive Patient Education  2017 [employer].

## 2022-10-20 ENCOUNTER — Ambulatory Visit (INDEPENDENT_AMBULATORY_CARE_PROVIDER_SITE_OTHER): Payer: Medicare (Managed Care) | Admitting: Internal Medicine

## 2022-10-20 ENCOUNTER — Encounter: Payer: Self-pay | Admitting: Internal Medicine

## 2022-10-20 VITALS — BP 160/120 | HR 103 | Temp 98.3°F | Ht 63.0 in | Wt 292.0 lb

## 2022-10-20 DIAGNOSIS — I10 Essential (primary) hypertension: Secondary | ICD-10-CM | POA: Diagnosis not present

## 2022-10-20 DIAGNOSIS — Z0000 Encounter for general adult medical examination without abnormal findings: Secondary | ICD-10-CM

## 2022-10-20 DIAGNOSIS — R7303 Prediabetes: Secondary | ICD-10-CM

## 2022-10-20 DIAGNOSIS — G8929 Other chronic pain: Secondary | ICD-10-CM

## 2022-10-20 DIAGNOSIS — M25512 Pain in left shoulder: Secondary | ICD-10-CM

## 2022-10-20 DIAGNOSIS — Z87891 Personal history of nicotine dependence: Secondary | ICD-10-CM | POA: Diagnosis not present

## 2022-10-20 DIAGNOSIS — Z1211 Encounter for screening for malignant neoplasm of colon: Secondary | ICD-10-CM

## 2022-10-20 LAB — CBC
HCT: 37.6 % (ref 36.0–46.0)
Hemoglobin: 12.5 g/dL (ref 12.0–15.0)
MCHC: 33.2 g/dL (ref 30.0–36.0)
MCV: 85.8 fl (ref 78.0–100.0)
Platelets: 287 10*3/uL (ref 150.0–400.0)
RBC: 4.39 Mil/uL (ref 3.87–5.11)
RDW: 13.3 % (ref 11.5–15.5)
WBC: 9.4 10*3/uL (ref 4.0–10.5)

## 2022-10-20 LAB — COMPREHENSIVE METABOLIC PANEL
ALT: 12 U/L (ref 0–35)
AST: 15 U/L (ref 0–37)
Albumin: 4 g/dL (ref 3.5–5.2)
Alkaline Phosphatase: 132 U/L — ABNORMAL HIGH (ref 39–117)
BUN: 10 mg/dL (ref 6–23)
CO2: 29 mEq/L (ref 19–32)
Calcium: 9.6 mg/dL (ref 8.4–10.5)
Chloride: 103 mEq/L (ref 96–112)
Creatinine, Ser: 0.83 mg/dL (ref 0.40–1.20)
GFR: 75.45 mL/min (ref >60.00)
Glucose, Bld: 88 mg/dL (ref 70–99)
Potassium: 3.7 mEq/L (ref 3.5–5.1)
Sodium: 137 mEq/L (ref 135–145)
Total Bilirubin: 0.4 mg/dL (ref 0.2–1.2)
Total Protein: 8.3 g/dL (ref 6.0–8.3)

## 2022-10-20 LAB — LIPID PANEL
Cholesterol: 105 mg/dL (ref 0–200)
HDL: 35.6 mg/dL — ABNORMAL LOW (ref >39.00)
LDL Cholesterol: 37 mg/dL (ref 0–99)
NonHDL: 69.35
Total CHOL/HDL Ratio: 3
Triglycerides: 160 mg/dL — ABNORMAL HIGH (ref 0.0–149.0)
VLDL: 32 mg/dL (ref 0.0–40.0)

## 2022-10-20 LAB — HEMOGLOBIN A1C: Hgb A1c MFr Bld: 5.9 % (ref 4.6–6.5)

## 2022-10-20 MED ORDER — TELMISARTAN-HCTZ 80-25 MG PO TABS: ORAL_TABLET | ORAL | 3 refills | Status: DC

## 2022-10-20 NOTE — Assessment & Plan Note (Signed)
Checking lipid panel and HgA1c. Weight down 5-6 pounds in the last year and encouraged continued changes to keep with gradual weight loss.

## 2022-10-20 NOTE — Assessment & Plan Note (Signed)
Checking HgA1c and adjust as needed.  

## 2022-10-20 NOTE — Assessment & Plan Note (Addendum)
BP elevated today initially but typically at goal. She has taken cold medication for allergies and asked to monitor and let us know BP.Taking telmisartan/hctz 80/25 mg daily. Checking CMP and CBC and lipid panel and adjust as needed.

## 2022-10-20 NOTE — Assessment & Plan Note (Signed)
Flu shot due fall. Covid-19 counseled. Shingrix complete. Tetanus due at pharmacy. Colonoscopy due referral placed. Lung cancer screening referral placed. Mammogram up to date, pap smear not indicated. Counseled about sun safety and mole surveillance. Counseled about the dangers of distracted driving. Given 10 year screening recommendations.

## 2022-10-20 NOTE — Assessment & Plan Note (Signed)
Qualifies for lung cancer screening and desires this. Referral placed.

## 2022-10-20 NOTE — Progress Notes (Signed)
   Subjective:   Patient ID: [name], female    DOB: Nov 16, 1960, 62 y.o.   MRN: [id number]  HPI The patient is here for physical.  PMH, [facility], social history reviewed and updated  Review of Systems  Constitutional: Negative.   HENT: Negative.    Eyes: Negative.   Respiratory:  Negative for cough, chest tightness and shortness of breath.   Cardiovascular:  Negative for chest pain, palpitations and leg swelling.  Gastrointestinal:  Negative for abdominal distention, abdominal pain, constipation, diarrhea, nausea and vomiting.  Musculoskeletal:  Positive for arthralgias and myalgias.  Skin: Negative.   Neurological: Negative.   Psychiatric/Behavioral: Negative.      Objective:  Physical Exam Constitutional:      Appearance: She is well-developed. She is obese.  HENT:     Head: Normocephalic and atraumatic.  Cardiovascular:     Rate and Rhythm: Normal rate and regular rhythm.  Pulmonary:     Effort: Pulmonary effort is normal. No respiratory distress.     Breath sounds: Normal breath sounds. No wheezing or rales.  Abdominal:     General: Bowel sounds are normal. There is no distension.     Palpations: Abdomen is soft.     Tenderness: There is no abdominal tenderness. There is no rebound.  Musculoskeletal:        General: Tenderness present.     Cervical back: Normal range of motion.  Skin:    General: Skin is warm and dry.  Neurological:     Mental Status: She is alert and oriented to person, place, and time.     Coordination: Coordination normal.     Vitals:   10/20/22 1300 10/20/22 1303 10/20/22 1328  BP: (!) 142/98 (!) 142/98 (!) 160/120  Pulse: (!) 103    Temp: 98.3 F (36.8 C)    TempSrc: Oral    SpO2: 98%    Weight: 292 lb (132.5 kg)    Height: 5\' 3"  (1.6 m)      Assessment & Plan:

## 2022-10-20 NOTE — Assessment & Plan Note (Signed)
Still hurting and rarely takes aleve. Mostly just deals with the pain. Tried exercises and they made it hurt worse.

## 2022-11-18 ENCOUNTER — Encounter: Payer: Self-pay | Admitting: Internal Medicine

## 2022-11-24 ENCOUNTER — Ambulatory Visit (INDEPENDENT_AMBULATORY_CARE_PROVIDER_SITE_OTHER): Payer: Medicare (Managed Care) | Admitting: Internal Medicine

## 2022-11-24 ENCOUNTER — Encounter: Payer: Self-pay | Admitting: Internal Medicine

## 2022-11-24 VITALS — BP 160/120 | HR 96 | Temp 98.2°F | Ht 63.0 in

## 2022-11-24 DIAGNOSIS — M79671 Pain in right foot: Secondary | ICD-10-CM

## 2022-11-24 DIAGNOSIS — M79672 Pain in left foot: Secondary | ICD-10-CM | POA: Insufficient documentation

## 2022-11-24 MED ORDER — PREDNISONE 20 MG PO TABS: 40.0000 mg | ORAL_TABLET | Freq: Every day | ORAL | 0 refills | Status: DC

## 2022-11-24 MED ORDER — KETOROLAC TROMETHAMINE 30 MG/ML IJ SOLN
30.0000 mg | Freq: Once | INTRAMUSCULAR | Status: AC
  Administered 2022-11-24: 30 mg via INTRAMUSCULAR

## 2022-11-24 NOTE — Assessment & Plan Note (Signed)
Suspect gout or psuedogout. Given toradol 30 mg IM at visit and rx 5 day course of prednisone. Will check uric acid in 1-2 months.

## 2022-11-24 NOTE — Patient Instructions (Addendum)
We have sent in prednisone to take 2 pills daily for 5 days.  We have given you a toradol shot to help the pain today.

## 2022-11-24 NOTE — Progress Notes (Signed)
   Subjective:   Patient ID: [name], female    DOB: Jul 06, 1961, 62 y.o.   MRN: [id number]  HPI The patient is a 62 YO female coming in for severe pain right foot without injury 2 days.  Review of Systems  Constitutional: Negative.   HENT: Negative.    Eyes: Negative.   Respiratory:  Negative for cough, chest tightness and shortness of breath.   Cardiovascular:  Negative for chest pain, palpitations and leg swelling.  Gastrointestinal:  Negative for abdominal distention, abdominal pain, constipation, diarrhea, nausea and vomiting.  Musculoskeletal:  Positive for arthralgias and myalgias.  Skin: Negative.   Neurological: Negative.   Psychiatric/Behavioral: Negative.      Objective:  Physical Exam Constitutional:      Appearance: She is well-developed. She is obese.  HENT:     Head: Normocephalic and atraumatic.  Cardiovascular:     Rate and Rhythm: Normal rate and regular rhythm.  Pulmonary:     Effort: Pulmonary effort is normal. No respiratory distress.     Breath sounds: Normal breath sounds. No wheezing or rales.  Abdominal:     General: Bowel sounds are normal. There is no distension.     Palpations: Abdomen is soft.     Tenderness: There is no abdominal tenderness. There is no rebound.  Musculoskeletal:        General: Tenderness present.     Cervical back: Normal range of motion.  Skin:    General: Skin is warm and dry.  Neurological:     Mental Status: She is alert and oriented to person, place, and time.     Coordination: Coordination normal.     Vitals:   11/24/22 1026 11/24/22 1029  BP: (!) 160/120 (!) 160/120  Pulse: 96   Temp: 98.2 F (36.8 C)   TempSrc: Oral   SpO2: 99%   Height: 5\' 3"  (1.6 m)     Assessment & Plan:  Toradol 30 mg IM given at visit

## 2022-11-26 ENCOUNTER — Encounter: Payer: Self-pay | Admitting: Internal Medicine

## 2022-12-01 ENCOUNTER — Ambulatory Visit (INDEPENDENT_AMBULATORY_CARE_PROVIDER_SITE_OTHER): Payer: Self-pay

## 2022-12-01 ENCOUNTER — Telehealth: Payer: Self-pay | Admitting: Podiatry

## 2022-12-01 ENCOUNTER — Other Ambulatory Visit: Payer: Self-pay | Admitting: Podiatry

## 2022-12-01 ENCOUNTER — Telehealth: Payer: Self-pay

## 2022-12-01 ENCOUNTER — Ambulatory Visit (INDEPENDENT_AMBULATORY_CARE_PROVIDER_SITE_OTHER): Payer: Medicare (Managed Care) | Admitting: Podiatry

## 2022-12-01 ENCOUNTER — Encounter: Payer: Self-pay | Admitting: Podiatry

## 2022-12-01 DIAGNOSIS — M7671 Peroneal tendinitis, right leg: Secondary | ICD-10-CM

## 2022-12-01 DIAGNOSIS — M779 Enthesopathy, unspecified: Secondary | ICD-10-CM

## 2022-12-01 DIAGNOSIS — M79671 Pain in right foot: Secondary | ICD-10-CM

## 2022-12-01 DIAGNOSIS — M7751 Other enthesopathy of right foot: Secondary | ICD-10-CM

## 2022-12-01 DIAGNOSIS — M79672 Pain in left foot: Secondary | ICD-10-CM

## 2022-12-01 MED ORDER — TRIAMCINOLONE ACETONIDE 10 MG/ML IJ SUSP
10.0000 mg | Freq: Once | INTRAMUSCULAR | Status: AC
Start: 2022-12-01 — End: 2022-12-01
  Administered 2022-12-01: 10 mg

## 2022-12-01 MED ORDER — DICLOFENAC SODIUM 75 MG PO TBEC: 75.0000 mg | DELAYED_RELEASE_TABLET | Freq: Two times a day (BID) | ORAL | 2 refills | Status: DC

## 2022-12-01 MED ORDER — DICLOFENAC SODIUM 75 MG PO TBEC
75.0000 mg | DELAYED_RELEASE_TABLET | Freq: Two times a day (BID) | ORAL | 2 refills | Status: DC
Start: 2022-12-01 — End: 2022-12-01

## 2022-12-01 NOTE — Telephone Encounter (Signed)
done

## 2022-12-01 NOTE — Telephone Encounter (Signed)
Dr. Marina Goodell, This patient is currently scheduled for a Pre Visit on 12/10/2022 and a direct colon with you on 12/29/2022.  However, during chart prep for Pre Visit, it was found that the patient's BMI (measures last month) was 51.73.   Please advise if you (or APP)  would like to see this patient for an OV or if this patient can be a direct colon at [facility]? Please/Thank you Bre

## 2022-12-01 NOTE — Progress Notes (Signed)
Subjective:   Patient ID: [name], female   DOB: 62 y.o.   MRN: [id number]   HPI Patient presents with a lot of pain in the right ankle with moderate pain also in the left that is just been recent.  States the right is been present for a longer period of time worse when she tries to move her foot but it has been very tender and patient does not currently smoke likes to be active   Review of Systems  All other systems reviewed and are negative.       Objective:  Physical Exam Vitals and nursing note reviewed.  Constitutional:      Appearance: She is well-developed.  Pulmonary:     Effort: Pulmonary effort is normal.  Musculoskeletal:        General: Normal range of motion.  Skin:    General: Skin is warm.  Neurological:     Mental Status: She is alert.     Neurovascular status intact muscle strength adequate range of motion adequate exquisite discomfort medial foot but more into the sinus tarsi right with inflammation into the peroneal groove also noted left shows mild discomfort not to the same degree     Assessment:  Acute capsulitis ankle right with peroneal tendinitis of a mild to moderate nature right over left H&P x-ray     Plan:  Right reviewed sterile prep injected the sinus tarsi 3 mg Kenalog 5 mg Xylocaine and placed a small amount into the peroneal tendon groove.  Advised this patient on Tuesday activity and dispensed ankle compression plantar fascial devices over the foot and reduced inversion eversion.  Reappoint to recheck  X-rays were negative for signs of fracture did indicate moderate arthritis moderate collapse medial longitudinal arch no other pathology noted

## 2022-12-01 NOTE — Telephone Encounter (Signed)
Pt stated she uses pharmacy on [address]. Please advise

## 2022-12-01 NOTE — Telephone Encounter (Signed)
[name],  Please call this patient and have her reschedule for an OV with either Dr. Marina Goodell or one of the APP's as she has not been seen here before and her BMI is greater than 50.  Thank you  Please cancel hr PV and her procedure if she is not able to work in an OV at least 5 days prior to her procedure.  Thank you Bre

## 2022-12-01 NOTE — Telephone Encounter (Signed)
She is not established with this practice. Given her high risk status, she should see me, or one of the advanced practitioners, for evaluation and to discuss colon cancer screening options.

## 2022-12-02 ENCOUNTER — Other Ambulatory Visit: Payer: Self-pay | Admitting: Podiatry

## 2022-12-02 DIAGNOSIS — M79672 Pain in left foot: Secondary | ICD-10-CM

## 2022-12-02 DIAGNOSIS — M779 Enthesopathy, unspecified: Secondary | ICD-10-CM

## 2022-12-02 DIAGNOSIS — M7751 Other enthesopathy of right foot: Secondary | ICD-10-CM

## 2022-12-02 DIAGNOSIS — M7671 Peroneal tendinitis, right leg: Secondary | ICD-10-CM

## 2022-12-02 DIAGNOSIS — M79671 Pain in right foot: Secondary | ICD-10-CM

## 2022-12-02 NOTE — Telephone Encounter (Signed)
Pt scheduled for 5/22 at 10:30 with Amy

## 2022-12-02 NOTE — Telephone Encounter (Signed)
[name], have you been able to get in touch with this patient? Bre

## 2022-12-10 ENCOUNTER — Encounter: Payer: Self-pay | Admitting: Physician Assistant

## 2022-12-10 ENCOUNTER — Ambulatory Visit (INDEPENDENT_AMBULATORY_CARE_PROVIDER_SITE_OTHER): Payer: Medicare (Managed Care) | Admitting: Physician Assistant

## 2022-12-10 DIAGNOSIS — Z1211 Encounter for screening for malignant neoplasm of colon: Secondary | ICD-10-CM

## 2022-12-10 MED ORDER — NA SULFATE-K SULFATE-MG SULF 17.5-3.13-1.6 GM/177ML PO SOLN: 1.0000 | ORAL | 0 refills | Status: DC

## 2022-12-10 NOTE — Progress Notes (Signed)
Subjective:    Patient ID: [name], female    DOB: 01-25-1961, 62 y.o.   MRN: [id number]  HPI  [name] is a pleasant 62 year old African-American female, new to GI today referred by [name] for screening colonoscopy.  Patient says that she did undergo a colonoscopy 10 years ago, in [place] but does not remember the physician's name.  She says that was a negative exam and she was told to follow-up in 10 years.  She does not have any current GI problems, denies any issues with abdominal pain, changes in bowel habits constipation, diarrhea melena or hematochezia. Family history is negative for colon cancer or advanced polyps as far she is aware. She does have history of morbid obesity with current BMI of 51.37 Also with hypertension, she is status post cholecystectomy, abdominal hysterectomy, and has seasonal allergies. She does not have any known cardiac disease, no sleep apnea or CPAP use or oxygen. She is not on any blood thinners.  Review of Systems Pertinent positive and negative review of systems were noted in the above HPI section.  All other review of systems was otherwise negative.   Outpatient Encounter Medications as of 12/10/2022  Medication Sig   acetaminophen (TYLENOL) 500 MG tablet Take 1,000 mg by mouth every 6 (six) hours as needed for mild pain or headache.   cromolyn (NASALCROM) 5.2 MG/ACT nasal spray Place 1 spray into both nostrils 4 (four) times daily.   diclofenac (VOLTAREN) 75 MG EC tablet Take 1 tablet (75 mg total) by mouth 2 (two) times daily.   fluticasone (FLONASE) 50 MCG/ACT nasal spray Place 2 sprays into both nostrils daily as needed for allergies.    ibuprofen (ADVIL,MOTRIN) 200 MG tablet Take 400 mg by mouth every 6 (six) hours as needed for moderate pain.   levocetirizine (XYZAL) 5 MG tablet Take 5 mg by mouth every evening. Reported on 11/12/2015   montelukast (SINGULAIR) 10 MG tablet Take 10 mg by mouth every evening.   Na Sulfate-K Sulfate-Mg  Sulf (SUPREP BOWEL PREP KIT) 17.5-3.13-1.6 GM/177ML SOLN Take 1 kit by mouth as directed.   telmisartan-hydrochlorothiazide (MICARDIS HCT) 80-25 MG tablet TAKE 1 TABLET BY MOUTH DAILY. PLEASE CALL OUR OFFICE TO SCHEDULE A FOLLOW UP   triamcinolone cream (KENALOG) 0.1 % APPLY TO AFFECTED AREA TWICE A DAY   TURMERIC CURCUMIN PO Take 500 mg by mouth daily.    No facility-administered encounter medications on file as of 12/10/2022.   Allergies  Allergen Reactions   Penicillins Hives    Has patient had a PCN reaction causing immediate rash, facial/tongue/throat swelling, SOB or lightheadedness with hypotension: Yes Has patient had a PCN reaction causing severe rash involving mucus membranes or skin necrosis: Yes Has patient had a PCN reaction that required hospitalization: Yes Has patient had a PCN reaction occurring within the last 10 years: Yes If all of the above answers are "NO", then may proceed with Cephalosporin use.    Patient Active Problem List   Diagnosis Date Noted   Right foot pain 11/24/2022   Pre-diabetes 10/20/2022   Former smoker 10/20/2022   Chronic left shoulder pain 01/30/2020   Chronic pain of both ankles 08/27/2018   Allergic rhinitis 05/11/2015   Severe obesity (BMI >= 40) (HCC) 05/04/2014   Routine general medical examination at a health care facility 05/04/2014   Essential hypertension, benign 01/19/2014   Social History   Socioeconomic History   Marital status: Married    Spouse name: Not on file  Number of children: 2   Years of education: Not on file   Highest education level: Not on file  Occupational History   Occupation: Housewife     Employer: [employer]  Tobacco Use   Smoking status: Former    Packs/day: 1.00    Years: 35.00    Additional pack years: 0.00    Total pack years: 35.00    Types: Cigarettes    Quit date: 09/18/2013    Years since quitting: 9.2   Smokeless tobacco: Never  Vaping Use   Vaping Use: Never used  Substance and  Sexual Activity   Alcohol use: No   Drug use: No   Sexual activity: Not on file  Other Topics Concern   Not on file  Social History Narrative   Was in the [employer] x1 year   Social Determinants of Health   Financial Resource Strain: Low Risk  (10/16/2022)   Overall Financial Resource Strain (CARDIA)    Difficulty of Paying Living Expenses: Not hard at all  Food Insecurity: No Food Insecurity (10/16/2022)   Hunger Vital Sign    Worried About Running Out of Food in the Last Year: Never true    Ran Out of Food in the Last Year: Never true  Transportation Needs: No Transportation Needs (10/16/2022)   PRAPARE - Administrator, Civil Service (Medical): No    Lack of Transportation (Non-Medical): No  Physical Activity: Sufficiently Active (10/16/2022)   Exercise Vital Sign    Days of Exercise per Week: 5 days    Minutes of Exercise per Session: 60 min  Stress: No Stress Concern Present (10/16/2022)   [name] of Occupational Health - Occupational Stress Questionnaire    Feeling of Stress : Not at all  Social Connections: Socially Integrated (10/16/2022)   Social Connection and Isolation Panel [NHANES]    Frequency of Communication with Friends and Family: More than three times a week    Frequency of Social Gatherings with Friends and Family: More than three times a week    Attends Religious Services: More than 4 times per year    Active Member of [employer] or Organizations: Yes    Attends Banker Meetings: More than 4 times per year    Marital Status: Married  Catering manager Violence: Not At Risk (10/16/2022)   Humiliation, Afraid, Rape, and Kick questionnaire    Fear of Current or Ex-Partner: No    Emotionally Abused: No    Physically Abused: No    Sexually Abused: No    [name]'s family history includes Aneurysm in her brother.      Objective:    Vitals:   12/10/22 1023  BP: (!) 140/68  Pulse: 71    Physical Exam Well-developed  well-nourished  older AA female in no acute distress.  Height, Weight,290  BMI 51.37  HEENT; nontraumatic normocephalic, EOMI, PE R LA, sclera anicteric. Oropharynx; not examined today  Neck; supple, no JVD Cardiovascular; regular rate and rhythm with S1-S2, no murmur rub or gallop Pulmonary; Clear bilaterally Abdomen; soft,obese  nontender, nondistended, no palpable mass or hepatosplenomegaly, bowel sounds are active Rectal;not done Skin; benign exam, no jaundice rash or appreciable lesions Extremities; no clubbing cyanosis or edema skin warm and dry Neuro/Psych; alert and oriented x4, grossly nonfocal mood and affect appropriate        Assessment & Plan:   #52 62 year old African-American female referred for 10-year interval follow-up colonoscopy.  She had 1 prior colonoscopy 10  years ago elsewhere in [place] and per patient report this was a negative exam and was told to follow-up in 10 years. Patient is currently asymptomatic Family history negative for colon cancer or advanced polyps  #2 morbid obesity with BMI 51.37 #3 hypertension #4.  Status post cholecystectomy and hysterectomy #5.  Seasonal allergies  Patient will be scheduled for colonoscopy at the hospital with Dr. Rhea Belton.  Procedure was discussed in detail with the patient including indications risk and benefits and she is agreeable to proceed. She does take NSAIDs on a regular basis was advised to stop for 5 days before and 5 days after the procedure. Further recommendations pending results of colonoscopy.  Efstathios Sawin Oswald Hillock PA-C 12/10/2022   Cc: Myrlene Broker, *

## 2022-12-10 NOTE — Patient Instructions (Addendum)
_______________________________________________________  If your blood pressure at your visit was 140/90 or greater, please contact your primary care physician to follow up on this.  If you are age 63 or younger, your body mass index should be between 19-25. Your Body mass index is 51.37 kg/m. If this is out of the aformentioned range listed, please consider follow up with your Primary Care Provider.  ________________________________________________________  The [facility] providers would like to encourage you to use [facility] to communicate with providers for non-urgent requests or questions.  Due to long hold times on the telephone, sending your provider a message by [facility] may be a faster and more efficient way to get a response.  Please allow 48 business hours for a response.  Please remember that this is for non-urgent requests.  _______________________________________________________  Please hold NSAIDS-Voltaren (diclofenac) and Ibuprofen for 5 days prior to procedure and 5 days after the procedure.  You have been scheduled for a colonoscopy. Please follow written instructions given to you at your visit today.  Please pick up your prep supplies at the pharmacy within the next 1-3 days. If you use inhalers (even only as needed), please bring them with you on the day of your procedure.  Due to recent changes in healthcare laws, you may see the results of your imaging and laboratory studies on MyChart before your provider has had a chance to review them.  We understand that in some cases there may be results that are confusing or concerning to you. Not all laboratory results come back in the same time frame and the provider may be waiting for multiple results in order to interpret others.  Please give [place] 48 hours in order for your provider to thoroughly review all the results before contacting the office for clarification of your results.   Thank you for entrusting me with your care and choosing  [facility].  Amy Esterwood, PA-c

## 2022-12-11 NOTE — Progress Notes (Signed)
Addendum: Reviewed and agree with assessment and management plan. Concetta Guion M, MD  

## 2022-12-22 ENCOUNTER — Encounter: Payer: Self-pay | Admitting: Podiatry

## 2022-12-22 ENCOUNTER — Ambulatory Visit (INDEPENDENT_AMBULATORY_CARE_PROVIDER_SITE_OTHER): Payer: Medicare (Managed Care) | Admitting: Podiatry

## 2022-12-22 DIAGNOSIS — M7661 Achilles tendinitis, right leg: Secondary | ICD-10-CM

## 2022-12-22 DIAGNOSIS — M7751 Other enthesopathy of right foot: Secondary | ICD-10-CM | POA: Diagnosis not present

## 2022-12-22 DIAGNOSIS — M7671 Peroneal tendinitis, right leg: Secondary | ICD-10-CM | POA: Diagnosis not present

## 2022-12-22 NOTE — Progress Notes (Signed)
Subjective:   Patient ID: [name], female   DOB: 62 y.o.   MRN: [id number]   HPI Patient states she is improving but still having some pain that seems to be in different areas now   ROS      Objective:  Physical Exam  Neurovascular status intact inflammation pain of the posterior compartment around the Achilles mild medial and minimal discomfort in the peroneal that we had worked on     Assessment:  Probability for compensatory changes in the Achilles and medial ankle right after having permanent peroneal tendinitis right with obesity is complicating factor     Plan:  H&P reviewed discussed all conditions do not recommend treatment except for range of motion exercises ice therapy that I explained today and patient will be seen back as needed

## 2022-12-24 ENCOUNTER — Encounter (HOSPITAL_COMMUNITY): Payer: Self-pay | Admitting: Internal Medicine

## 2022-12-29 ENCOUNTER — Encounter: Payer: Medicare (Managed Care) | Admitting: Internal Medicine

## 2022-12-31 ENCOUNTER — Encounter (HOSPITAL_COMMUNITY): Payer: Self-pay | Admitting: Certified Registered Nurse Anesthetist

## 2022-12-31 ENCOUNTER — Telehealth: Payer: Self-pay

## 2022-12-31 NOTE — Telephone Encounter (Signed)
Per Dr. Terri Piedra pt was scheduled in a blocked slot at [facility] for 01/01/23 at 9:45am, he will not be able to do this procedure. Appt cancelled and pt kinows we will contact her to reschedule the procedure for an alternate date.

## 2023-01-01 ENCOUNTER — Ambulatory Visit (HOSPITAL_COMMUNITY)
Admission: RE | Admit: 2023-01-01 | Payer: Medicare (Managed Care) | Source: Home / Self Care | Admitting: Internal Medicine

## 2023-01-01 SURGERY — COLONOSCOPY WITH PROPOFOL
Anesthesia: Monitor Anesthesia Care

## 2023-01-08 ENCOUNTER — Other Ambulatory Visit: Payer: Self-pay

## 2023-01-08 DIAGNOSIS — Z1211 Encounter for screening for malignant neoplasm of colon: Secondary | ICD-10-CM

## 2023-01-08 NOTE — Telephone Encounter (Signed)
Pts colon rescheduled at [facility] 04/13/23 at 7:30am, pt to arrive there at 6am. New amb ref entered in epic, instructions mailed to pt. Case [id number]. Pt aware of appt.

## 2023-01-28 ENCOUNTER — Ambulatory Visit: Payer: Medicare (Managed Care) | Admitting: Family Medicine

## 2023-01-28 ENCOUNTER — Other Ambulatory Visit: Payer: Self-pay

## 2023-01-28 ENCOUNTER — Encounter: Payer: Self-pay | Admitting: Family Medicine

## 2023-01-28 ENCOUNTER — Ambulatory Visit (INDEPENDENT_AMBULATORY_CARE_PROVIDER_SITE_OTHER): Payer: Medicare (Managed Care)

## 2023-01-28 VITALS — BP 132/76 | HR 86 | Ht 63.0 in | Wt 291.0 lb

## 2023-01-28 DIAGNOSIS — M79662 Pain in left lower leg: Secondary | ICD-10-CM

## 2023-01-28 DIAGNOSIS — M25562 Pain in left knee: Secondary | ICD-10-CM

## 2023-01-28 DIAGNOSIS — M1712 Unilateral primary osteoarthritis, left knee: Secondary | ICD-10-CM | POA: Diagnosis not present

## 2023-01-28 NOTE — Patient Instructions (Addendum)
Thank you for coming in today.   You received an injection today. Seek immediate medical attention if the joint becomes red, extremely painful, or is oozing fluid.   Please get an Xray today before you leave   Let me know how your knee is feeling.

## 2023-01-28 NOTE — Progress Notes (Signed)
I, Stevenson Clinch, CMA acting as a scribe for [name], MD.   [name] is a 62 y.o. female who presents to [facility] today for L leg pain. Pt was previously seen by Dr. Denyse Amass in 2021-22 for L shoulder pain.  Today, pt c/o L leg pain x 3 weeks. Pt locates pain to posterior aspect of the left knee and calf. Sx started while stretching in the pool. Did leg lifts the following days which exacerbated sx. Sx worse with ambulation. Leg feels weak at times, like it will give out. As tried Aleve prn as well as pain patches. No prior injuries. No recent diagnostic imaging.   Low back pain: no Radiating pain: lower leg LE numbness/tingling: no LE weakness: yes Aggravates: ambulation Treatments tried: Aleve, pain patch  Dx imaging: 12/01/22 L ankle XR  Pertinent review of systems: No fevers or chills  Relevant historical information: Hypertension   Exam:  BP 132/76   Pulse 86   Ht 5\' 3"  (1.6 m)   Wt 291 lb (132 kg)   SpO2 98%   BMI 51.55 kg/m  General: Well Developed, well nourished, and in no acute distress.   MSK: Left knee obese but no obvious effusion is present. Knee and calf is nontender to palpation. Stable ligamentous exam. Able to reproduce pain with resisted knee extension and flexion strength testing. Negative Murray's test.  Intact strength.    Lab and Radiology Results  Procedure: Real-time Ultrasound Guided Injection of left knee joint anterior medial approach Device: Philips Affiniti 50G/GE Logiq Images permanently stored and available for review in PACS Verbal informed consent obtained.  Discussed risks and benefits of procedure. Warned about infection, bleeding, hyperglycemia damage to structures among others. Patient expresses understanding and agreement Time-out conducted.   Noted no overlying erythema, induration, or other signs of local infection.   Skin prepped in a sterile fashion.   Local anesthesia: Topical  Ethyl chloride.   With sterile technique and under real time ultrasound guidance: 40 mg of Kenalog and 2 mL of Marcaine injected into knee joint. Fluid seen entering the joint capsule.   Completed without difficulty   Pain immediately resolved suggesting accurate placement of the medication.   Advised to call if fevers/chills, erythema, induration, drainage, or persistent bleeding.   Images permanently stored and available for review in the ultrasound unit.  Impression: Technically successful ultrasound guided injection.   X-ray images left knee obtained today personally and independently interpreted Moderate medial and patellofemoral DJD.  No acute fractures Await formal radiology review   Assessment and Plan: 62 y.o. female with left knee and posterior leg pain.  Etiology is somewhat unclear.  Her pain is most consistent with knee pain.  Lumbar radiculopathy or even claudication could be a possibility.  Plan for trial of knee injection today.  If that is not helpful we will proceed to ABI and lumbar spine x-ray and further evaluation lumbar radiculopathy.   PDMP not reviewed this encounter. Orders Placed This Encounter  Procedures   [place] LIMITED JOINT SPACE STRUCTURES LOW LEFT(NO LINKED CHARGES)    Order Specific Question:   Reason for Exam (SYMPTOM  OR DIAGNOSIS REQUIRED)    Answer:   left knee/lower leg pain and swelling    Order Specific Question:   Preferred imaging location?    Answer:   [facility] Knee AP/LAT W/Sunrise Left    Standing Status:   Future    Number  of Occurrences:   1    Standing Expiration Date:   02/28/2023    Order Specific Question:   Reason for Exam (SYMPTOM  OR DIAGNOSIS REQUIRED)    Answer:   left knee pain    Order Specific Question:   Preferred imaging location?    Answer:   [facility]   No orders of the defined types were placed in this encounter.    Discussed warning signs or symptoms. Please see discharge  instructions. Patient expresses understanding.   The above documentation has been reviewed and is accurate and complete [name], M.D.

## 2023-01-30 ENCOUNTER — Other Ambulatory Visit: Payer: Self-pay | Admitting: *Deleted

## 2023-01-30 DIAGNOSIS — Z87891 Personal history of nicotine dependence: Secondary | ICD-10-CM

## 2023-01-30 DIAGNOSIS — Z122 Encounter for screening for malignant neoplasm of respiratory organs: Secondary | ICD-10-CM

## 2023-02-03 NOTE — Progress Notes (Signed)
 Left knee x-ray shows mild arthritis.

## 2023-03-10 ENCOUNTER — Other Ambulatory Visit: Payer: Self-pay

## 2023-03-10 ENCOUNTER — Ambulatory Visit: Payer: Medicare (Managed Care) | Admitting: Family Medicine

## 2023-03-10 VITALS — BP 164/90 | HR 99 | Ht 63.0 in | Wt 297.0 lb

## 2023-03-10 DIAGNOSIS — M25562 Pain in left knee: Secondary | ICD-10-CM

## 2023-03-10 NOTE — Patient Instructions (Addendum)
Thank you for coming in today.   You should hear from MRI scheduling within 1 week. If you do not hear please let me know.    Once we get the results either return or I will send you to a surgeon.

## 2023-03-10 NOTE — Progress Notes (Unsigned)
   [name], PhD, LAT, ATC acting as a scribe for [name], MD.  [name] is a 62 y.o. female who presents to [facility] at [facility] today for 6-wk f/u L knee and posterior leg pain. Pt was last seen by Dr. Denyse Amass on 01/28/23 and was given a L knee steroid injection.  Today, pt reports L knee is very painful. She suffered a fall about 3-wks ago. She was walking through the doorway and the screen door clipped that back of her heel, causing her to fall forward, landing on her R knee. Pt locates pain to the anterior aspect of her L knee.   Dx imaging: 01/28/23 L knee XR  Pertinent review of systems: No fevers or chills  Relevant historical information: Hypertension and obesity   Exam:  BP (!) 164/90   Pulse 99   Ht 5\' 3"  (1.6 m)   Wt 297 lb (134.7 kg)   SpO2 99%   BMI 52.61 kg/m  General: Well Developed, well nourished, and in no acute distress.   MSK: Left knee largely normal appearing. Tender palpation anterior medial knee. Decreased range of motion lacks full flexion beyond 100 degrees. Guarding with ligament exam testing.  No obvious laxity but nondiagnostic test. Positive medial McMurray's test. Decreased strength to extension impaired by pain. Antalgic gait    Lab and Radiology Results  Diagnostic Limited MSK Ultrasound of: Left knee Joint effusion is present.  Intact quad and patellar tendon.  Degenerative appearing medial joint line. Impression: Joint effusion      Assessment and Plan: 62 y.o. female with left knee pain after a fall.  She was seen for the left knee pain about 6 weeks ago where she had a steroid injection.  She was doing well but then fell injuring her left knee.  I am concerned for a ligament or meniscus injury.  She is significantly worsened.  Plan for MRI to dictate future treatments. In the meantime recommend reduced weightbearing with a cane or walker.  PDMP reviewed during this encounter. Orders Placed This  Encounter  Procedures   [place] LIMITED JOINT SPACE STRUCTURES LOW LEFT(NO LINKED CHARGES)    Order Specific Question:   Reason for Exam (SYMPTOM  OR DIAGNOSIS REQUIRED)    Answer:   left knee pain    Order Specific Question:   Preferred imaging location?    Answer:   [facility]   MR Knee Left  Wo Contrast    Standing Status:   Future    Standing Expiration Date:   03/09/2024    Order Specific Question:   What is the patient's sedation requirement?    Answer:   No Sedation    Order Specific Question:   Does the patient have a pacemaker or implanted devices?    Answer:   No    Order Specific Question:   Preferred imaging location?    Answer:   [facility] (table limit-550lbs)   No orders of the defined types were placed in this encounter.    Discussed warning signs or symptoms. Please see discharge instructions. Patient expresses understanding.   The above documentation has been reviewed and is accurate and complete [name], M.D.

## 2023-03-11 ENCOUNTER — Encounter: Payer: Self-pay | Admitting: Primary Care

## 2023-03-11 ENCOUNTER — Ambulatory Visit (INDEPENDENT_AMBULATORY_CARE_PROVIDER_SITE_OTHER): Payer: Medicare (Managed Care) | Admitting: Primary Care

## 2023-03-11 DIAGNOSIS — Z87891 Personal history of nicotine dependence: Secondary | ICD-10-CM | POA: Diagnosis not present

## 2023-03-11 NOTE — Patient Instructions (Signed)

## 2023-03-11 NOTE — Progress Notes (Signed)
Virtual Visit via Telephone Note  I connected with [name] on 03/11/23 at  4:00 PM EDT by telephone and verified that I am speaking with the correct person using two identifiers.  Location: Patient: Home Provider: Office   I discussed the limitations, risks, security and privacy concerns of performing an evaluation and management service by telephone and the availability of in person appointments. I also discussed with the patient that there may be a patient responsible charge related to this service. The patient expressed understanding and agreed to proceed.   Shared Decision Making Visit Lung Cancer Screening Program [phone])   Eligibility: Age 62 y.o. Pack Years Smoking History Calculation 60 (# packs/per year x # years smoked) Recent History of coughing up blood  no Unexplained weight loss? no ( >Than 15 pounds within the last 6 months ) Prior History Lung / other cancer no (Diagnosis within the last 5 years already requiring surveillance chest CT Scans). Smoking Status Former Smoker Former Smokers: Years since quit: 8 years  Quit Date: 2016  Visit Components: Discussion included one or more decision making aids. yes Discussion included risk/benefits of screening. yes Discussion included potential follow up diagnostic testing for abnormal scans. yes Discussion included meaning and risk of over diagnosis. yes Discussion included meaning and risk of False Positives. yes Discussion included meaning of total radiation exposure. yes  Counseling Included: Importance of adherence to annual lung cancer LDCT screening. yes Impact of comorbidities on ability to participate in the program. yes Ability and willingness to under diagnostic treatment. yes  Smoking Cessation Counseling: Current Smokers:  Discussed importance of smoking cessation. yes Information about tobacco cessation classes and interventions provided to patient. yes Patient provided with "ticket" for LDCT  Scan. NA Symptomatic Patient. no  Counseling(Intermediate counseling: > three minutes) 99406 Diagnosis Code: Tobacco Use Z72.0 Asymptomatic Patient yes  Counseling (Intermediate counseling: > three minutes counseling) U0454 Former Smokers:  Discussed the importance of maintaining cigarette abstinence. yes Diagnosis Code: Personal History of Nicotine Dependence. U98.119 Information about tobacco cessation classes and interventions provided to patient. Yes Patient provided with "ticket" for LDCT Scan. NA Written Order for Lung Cancer Screening with LDCT placed in Epic. Yes (CT Chest Lung Cancer Screening Low Dose W/O CM) [id number] Z12.2-Screening of respiratory organs Z87.891-Personal history of nicotine dependence  I have spent 25 minutes of face to face/ virtual visit   time with Ms Hice discussing the risks and benefits of lung cancer screening. We viewed / discussed a power point together that explained in detail the above noted topics. We paused at intervals to allow for questions to be asked and answered to ensure understanding.We discussed that the single most powerful action that she can take to decrease her risk of developing lung cancer is to quit smoking. We discussed whether or not she is ready to commit to setting a quit date. We discussed options for tools to aid in quitting smoking including nicotine replacement therapy, non-nicotine medications, support groups, Quit Smart classes, and behavior modification. We discussed that often times setting smaller, more achievable goals, such as eliminating 1 cigarette a day for a week and then 2 cigarettes a day for a week can be helpful in slowly decreasing the number of cigarettes smoked. This allows for a sense of accomplishment as well as providing a clinical benefit. I provided  her  with smoking cessation  information  with contact information for community resources, classes, free nicotine replacement therapy, and access to mobile apps,  text messaging,  and on-line smoking cessation help. I have also provided  her  the office contact information in the event she needs to contact me, or the screening staff. We discussed the time and location of the scan, and that either Abigail Miyamoto RN, Karlton Lemon, RN  or I will call / send a letter with the results within 24-72 hours of receiving them. The patient verbalized understanding of all of  the above and had no further questions upon leaving the office. They have my contact information in the event they have any further questions.  I spent 3-5 minutes counseling on smoking cessation and the health risks of continued tobacco abuse.  I explained to the patient that there has been a high incidence of coronary artery disease noted on these exams. I explained that this is a non-gated exam therefore degree or severity cannot be determined. This patient is not on statin therapy. I have asked the patient to follow-up with their PCP regarding any incidental finding of coronary artery disease and management with diet or medication as their PCP  feels is clinically indicated. The patient verbalized understanding of the above and had no further questions upon completion of the visit.   Glenford Bayley, NP

## 2023-03-13 ENCOUNTER — Ambulatory Visit
Admission: RE | Admit: 2023-03-13 | Discharge: 2023-03-13 | Disposition: A | Discharge status: Home / Self Care | Payer: Medicare (Managed Care) | Source: Ambulatory Visit | Attending: Acute Care | Admitting: Acute Care

## 2023-03-13 DIAGNOSIS — Z87891 Personal history of nicotine dependence: Secondary | ICD-10-CM | POA: Diagnosis not present

## 2023-03-13 DIAGNOSIS — Z122 Encounter for screening for malignant neoplasm of respiratory organs: Secondary | ICD-10-CM

## 2023-03-24 ENCOUNTER — Other Ambulatory Visit: Payer: Self-pay

## 2023-03-24 DIAGNOSIS — Z87891 Personal history of nicotine dependence: Secondary | ICD-10-CM

## 2023-03-24 DIAGNOSIS — Z122 Encounter for screening for malignant neoplasm of respiratory organs: Secondary | ICD-10-CM

## 2023-03-31 ENCOUNTER — Encounter: Payer: Self-pay | Admitting: Family Medicine

## 2023-04-03 ENCOUNTER — Other Ambulatory Visit: Payer: Self-pay | Admitting: Physician Assistant

## 2023-04-06 ENCOUNTER — Other Ambulatory Visit: Payer: Self-pay

## 2023-04-06 ENCOUNTER — Telehealth: Payer: Self-pay | Admitting: Internal Medicine

## 2023-04-06 ENCOUNTER — Encounter (HOSPITAL_COMMUNITY): Payer: Self-pay | Admitting: Internal Medicine

## 2023-04-06 NOTE — Telephone Encounter (Signed)
This has been updated.

## 2023-04-06 NOTE — Telephone Encounter (Signed)
Pt is requesting her pharmacy to be changed to [facility]. For her blood pressure meds and eczema creme.

## 2023-04-07 ENCOUNTER — Ambulatory Visit
Admission: RE | Admit: 2023-04-07 | Discharge: 2023-04-07 | Disposition: A | Discharge status: Home / Self Care | Payer: Medicare (Managed Care) | Source: Ambulatory Visit | Attending: Family Medicine | Admitting: Family Medicine

## 2023-04-07 ENCOUNTER — Other Ambulatory Visit: Payer: Self-pay | Admitting: Physician Assistant

## 2023-04-07 DIAGNOSIS — M25562 Pain in left knee: Secondary | ICD-10-CM

## 2023-04-07 DIAGNOSIS — M23322 Other meniscus derangements, posterior horn of medial meniscus, left knee: Secondary | ICD-10-CM | POA: Diagnosis not present

## 2023-04-07 DIAGNOSIS — M948X6 Other specified disorders of cartilage, lower leg: Secondary | ICD-10-CM | POA: Diagnosis not present

## 2023-04-07 DIAGNOSIS — M84364A Stress fracture, left fibula, initial encounter for fracture: Secondary | ICD-10-CM | POA: Diagnosis not present

## 2023-04-11 NOTE — Anesthesia Preprocedure Evaluation (Signed)
Anesthesia Evaluation  Patient identified by MRN, date of birth, ID band Patient awake    Reviewed: Allergy & Precautions, H&P , NPO status , Patient's Chart, lab work & pertinent test results  Airway Mallampati: II  TM Distance: >3 FB Neck ROM: Full    Dental no notable dental hx. (+) Teeth Intact, Dental Advisory Given   Pulmonary shortness of breath, former smoker   Pulmonary exam normal breath sounds clear to auscultation       Cardiovascular Exercise Tolerance: Good hypertension, Pt. on medications  Rhythm:Regular Rate:Normal     Neuro/Psych  Headaches  negative psych ROS   GI/Hepatic Neg liver ROS,GERD  ,,  Endo/Other    Morbid obesity  Renal/GU negative Renal ROS  negative genitourinary   Musculoskeletal  (+) Arthritis , Osteoarthritis,    Abdominal   Peds  Hematology negative hematology ROS (+)   Anesthesia Other Findings   Reproductive/Obstetrics negative OB ROS                             Anesthesia Physical Anesthesia Plan  ASA: 3  Anesthesia Plan: MAC   Post-op Pain Management: Minimal or no pain anticipated   Induction: Intravenous  PONV Risk Score and Plan: 2 and Propofol infusion and Treatment may vary due to age or medical condition  Airway Management Planned: Natural Airway and Simple Face Mask  Additional Equipment:   Intra-op Plan:   Post-operative Plan:   Informed Consent: I have reviewed the patients History and Physical, chart, labs and discussed the procedure including the risks, benefits and alternatives for the proposed anesthesia with the patient or authorized representative who has indicated his/her understanding and acceptance.     Dental advisory given  Plan Discussed with: CRNA  Anesthesia Plan Comments:        Anesthesia Quick Evaluation

## 2023-04-13 ENCOUNTER — Ambulatory Visit (HOSPITAL_COMMUNITY): Payer: Self-pay | Admitting: Anesthesiology

## 2023-04-13 ENCOUNTER — Other Ambulatory Visit: Payer: Self-pay

## 2023-04-13 ENCOUNTER — Encounter (HOSPITAL_COMMUNITY): Payer: Self-pay | Admitting: Internal Medicine

## 2023-04-13 ENCOUNTER — Ambulatory Visit (HOSPITAL_COMMUNITY)
Admission: RE | Admit: 2023-04-13 | Discharge: 2023-04-13 | Disposition: A | Discharge status: Home / Self Care | Payer: Medicare (Managed Care) | Attending: Internal Medicine | Admitting: Internal Medicine

## 2023-04-13 ENCOUNTER — Ambulatory Visit (HOSPITAL_BASED_OUTPATIENT_CLINIC_OR_DEPARTMENT_OTHER): Payer: Medicare (Managed Care) | Admitting: Anesthesiology

## 2023-04-13 ENCOUNTER — Encounter (HOSPITAL_COMMUNITY)
Admission: RE | Disposition: A | Discharge status: Home / Self Care | Payer: Self-pay | Source: Home / Self Care | Attending: Internal Medicine

## 2023-04-13 DIAGNOSIS — I10 Essential (primary) hypertension: Secondary | ICD-10-CM | POA: Diagnosis not present

## 2023-04-13 DIAGNOSIS — Z1211 Encounter for screening for malignant neoplasm of colon: Secondary | ICD-10-CM | POA: Insufficient documentation

## 2023-04-13 DIAGNOSIS — Z87891 Personal history of nicotine dependence: Secondary | ICD-10-CM

## 2023-04-13 DIAGNOSIS — M1990 Unspecified osteoarthritis, unspecified site: Secondary | ICD-10-CM | POA: Diagnosis not present

## 2023-04-13 DIAGNOSIS — K5730 Diverticulosis of large intestine without perforation or abscess without bleeding: Secondary | ICD-10-CM | POA: Insufficient documentation

## 2023-04-13 DIAGNOSIS — Z6841 Body Mass Index (BMI) 40.0 and over, adult: Secondary | ICD-10-CM | POA: Insufficient documentation

## 2023-04-13 DIAGNOSIS — K648 Other hemorrhoids: Secondary | ICD-10-CM | POA: Diagnosis not present

## 2023-04-13 DIAGNOSIS — Z79899 Other long term (current) drug therapy: Secondary | ICD-10-CM | POA: Insufficient documentation

## 2023-04-13 HISTORY — PX: COLONOSCOPY WITH PROPOFOL: SHX5780

## 2023-04-13 SURGERY — COLONOSCOPY WITH PROPOFOL
Anesthesia: Monitor Anesthesia Care

## 2023-04-13 MED ORDER — PROPOFOL 10 MG/ML IV BOLUS
INTRAVENOUS | Status: DC | PRN
  Administered 2023-04-13 (×2): 30 mg via INTRAVENOUS
  Administered 2023-04-13: 20 mg via INTRAVENOUS

## 2023-04-13 MED ORDER — DEXMEDETOMIDINE HCL IN NACL 80 MCG/20ML IV SOLN
INTRAVENOUS | Status: AC
  Filled 2023-04-13: qty 20

## 2023-04-13 MED ORDER — DEXMEDETOMIDINE HCL IN NACL 80 MCG/20ML IV SOLN
INTRAVENOUS | Status: DC | PRN
  Administered 2023-04-13: 8 ug via INTRAVENOUS

## 2023-04-13 MED ORDER — PROPOFOL 500 MG/50ML IV EMUL
INTRAVENOUS | Status: AC
  Filled 2023-04-13: qty 100

## 2023-04-13 MED ORDER — PROPOFOL 500 MG/50ML IV EMUL
INTRAVENOUS | Status: DC | PRN
  Administered 2023-04-13: 125 ug/kg/min via INTRAVENOUS

## 2023-04-13 MED ORDER — LACTATED RINGERS IV SOLN
INTRAVENOUS | Status: DC | PRN
  Administered 2023-04-13: 1000 mL via INTRAVENOUS

## 2023-04-13 SURGICAL SUPPLY — 22 items
ELECT REM PT RETURN 9FT ADLT (ELECTROSURGICAL)
ELECTRODE REM PT RTRN 9FT ADLT (ELECTROSURGICAL) IMPLANT
FCP BXJMBJMB 240X2.8X (CUTTING FORCEPS)
FLOOR PAD 36X40 (MISCELLANEOUS) ×1
FORCEPS BIOP RAD 4 LRG CAP 4 (CUTTING FORCEPS) IMPLANT
FORCEPS BIOP RJ4 240 W/NDL (CUTTING FORCEPS)
FORCEPS BXJMBJMB 240X2.8X (CUTTING FORCEPS) IMPLANT
INJECTOR/SNARE I SNARE (MISCELLANEOUS) IMPLANT
LUBRICANT JELLY 4.5OZ STERILE (MISCELLANEOUS) IMPLANT
MANIFOLD NEPTUNE II (INSTRUMENTS) IMPLANT
NDL SCLEROTHERAPY 25GX240 (NEEDLE) IMPLANT
NEEDLE SCLEROTHERAPY 25GX240 (NEEDLE)
PAD FLOOR 36X40 (MISCELLANEOUS) ×1 IMPLANT
PROBE APC STR FIRE (PROBE) IMPLANT
PROBE INJECTION GOLD (MISCELLANEOUS)
PROBE INJECTION GOLD 7FR (MISCELLANEOUS) IMPLANT
SNARE ROTATE MED OVAL 20MM (MISCELLANEOUS) IMPLANT
SYR 50ML LL SCALE MARK (SYRINGE) IMPLANT
TRAP SPECIMEN MUCOUS 40CC (MISCELLANEOUS) IMPLANT
TUBING ENDO SMARTCAP PENTAX (MISCELLANEOUS) IMPLANT
TUBING IRRIGATION ENDOGATOR (MISCELLANEOUS) ×1 IMPLANT
WATER STERILE IRR 1000ML POUR (IV SOLUTION) IMPLANT

## 2023-04-13 NOTE — Anesthesia Postprocedure Evaluation (Signed)
Anesthesia Post Note  Patient: [name]  Procedure(s) Performed: COLONOSCOPY WITH PROPOFOL     Patient location during evaluation: Endoscopy Anesthesia Type: MAC Level of consciousness: awake and alert Pain management: pain level controlled Vital Signs Assessment: post-procedure vital signs reviewed and stable Respiratory status: spontaneous breathing, nonlabored ventilation and respiratory function stable Cardiovascular status: stable and blood pressure returned to baseline Postop Assessment: no apparent nausea or vomiting Anesthetic complications: no  No notable events documented.  Last Vitals:  Vitals:   04/13/23 0810 04/13/23 0814  BP: (!) 97/49 (!) 108/53  Pulse: 89 90  Resp: (!) 22 20  Temp:    SpO2: 97% 97%    Last Pain:  Vitals:   04/13/23 0814  TempSrc:   PainSc: 0-No pain                 Alayzha An,W. EDMOND

## 2023-04-13 NOTE — Anesthesia Procedure Notes (Signed)
Procedure Name: MAC Date/Time: 04/13/2023 7:30 AM  Performed by: Vanessa Honeoye, CRNAPre-anesthesia Checklist: Patient identified, Emergency Drugs available, Suction available and Patient being monitored Patient Re-evaluated:Patient Re-evaluated prior to induction Oxygen Delivery Method: Simple face mask

## 2023-04-13 NOTE — Transfer of Care (Signed)
Immediate Anesthesia Transfer of Care Note  Patient: [name]  Procedure(s) Performed: COLONOSCOPY WITH PROPOFOL  Patient Location: Endoscopy Unit  Anesthesia Type:MAC  Level of Consciousness: drowsy  Airway & Oxygen Therapy: Patient Spontanous Breathing and Patient connected to face mask  Post-op Assessment: Report given to RN and Post -op Vital signs reviewed and stable  Post vital signs: Reviewed and stable  Last Vitals:  Vitals Value Taken Time  BP 97/40 04/13/23 0758  Temp    Pulse 88 04/13/23 0758  Resp 22 04/13/23 0758  SpO2 100 % 04/13/23 0758    Last Pain:  Vitals:   04/13/23 0758  TempSrc:   PainSc: Asleep         Complications: No notable events documented.

## 2023-04-13 NOTE — Discharge Instructions (Signed)

## 2023-04-13 NOTE — H&P (Signed)
GASTROENTEROLOGY PROCEDURE H&P NOTE   Primary Care Physician: Myrlene Broker, MD    Reason for Procedure:  Colon cancer screening  Plan:    Colonoscopy  Patient is appropriate for endoscopic procedure(s) in the outpatient hospital setting.  The nature of the procedure, as well as the risks, benefits, and alternatives were carefully and thoroughly reviewed with the patient. Ample time for discussion and questions allowed. The patient understood, was satisfied, and agreed to proceed.     HPI: [name] is a 62 y.o. female who presents for colonoscopy.  Medical history as below.  Tolerated the prep.  No recent chest pain or shortness of breath.  No abdominal pain today.  Past Medical History:  Diagnosis Date   Arthritis    Bronchitis    Complication of anesthesia    hard to wake up years ago   Environmental and seasonal allergies    dust mites, dust, cats,trees,   GERD (gastroesophageal reflux disease)    Headache    Heart murmur    Hypertension    Shortness of breath dyspnea    with exertion    Past Surgical History:  Procedure Laterality Date   ABDOMINAL HYSTERECTOMY     CHOLECYSTECTOMY N/A 09/27/2018   Procedure: LAPAROSCOPIC CHOLECYSTECTOMY;  Surgeon: Harriette Bouillon, MD;  Location: [facility] OR;  Service: General;  Laterality: N/A;   CYST REMOVAL HAND     NASAL SINUS SURGERY     ORIF ANKLE FRACTURE Right 07/30/2015   Procedure: OPEN REDUCTION INTERNAL FIXATION (ORIF) RIGHT  ANKLE ;  Surgeon: Samson Frederic, MD;  Location: [facility]S;  Service: Orthopedics;  Laterality: Right;    Prior to Admission medications   Medication Sig Start Date End Date Taking? Authorizing Provider  acetaminophen (TYLENOL) 650 MG CR tablet Take 1,300 mg by mouth every 8 (eight) hours as needed for pain.   Yes [provider]  cromolyn (NASALCROM) 5.2 MG/ACT nasal spray Place 1 spray into both nostrils 4 (four) times daily. Patient taking differently: Place 1 spray into both  nostrils 4 (four) times daily as needed for allergies. 09/17/17  Yes Linus Mako B, NP  diclofenac (VOLTAREN) 75 MG EC tablet Take 1 tablet (75 mg total) by mouth 2 (two) times daily. 12/01/22  Yes Regal, Kirstie Peri, DPM  fluticasone (FLONASE) 50 MCG/ACT nasal spray Place 2 sprays into both nostrils daily as needed for allergies.  12/13/13  Yes [provider]  ibuprofen (ADVIL,MOTRIN) 200 MG tablet Take 200 mg by mouth every 8 (eight) hours as needed (pain.).   Yes [provider]  loratadine (CLARITIN) 10 MG tablet Take 10 mg by mouth daily as needed for allergies.   Yes [provider]  Multiple Vitamin (MULTIVITAMIN WITH MINERALS) TABS tablet Take 1 tablet by mouth daily.   Yes [provider]  telmisartan-hydrochlorothiazide (MICARDIS HCT) 80-25 MG tablet TAKE 1 TABLET BY MOUTH DAILY. PLEASE CALL OUR OFFICE TO SCHEDULE A FOLLOW UP 10/20/22  Yes Myrlene Broker, MD  triamcinolone cream (KENALOG) 0.1 % APPLY TO AFFECTED AREA TWICE A DAY Patient taking differently: Apply 1 Application topically 2 (two) times daily as needed (skin irritation.). 01/30/20   Myrlene Broker, MD  TURMERIC CURCUMIN PO Take 500 mg by mouth in the morning.    [provider]    No current facility-administered medications for this encounter.   Facility-Administered Medications Ordered in Other Encounters  Medication Dose Route Frequency Provider Last Rate Last Admin   lactated ringers infusion  Intravenous Continuous PRN Vanessa Backus, CRNA   1,000 mL at 04/13/23 0710    Allergies as of 01/08/2023 - Review Complete 12/25/2022  Allergen Reaction Noted   Penicillins Hives 01/15/2014    Family History  Problem Relation Age of Onset   Aneurysm Brother    Colon cancer Neg Hx    Liver disease Neg Hx    Esophageal cancer Neg Hx     Social History   Socioeconomic History   Marital status: Married    Spouse name: Not on file   Number of children: 2    Years of education: Not on file   Highest education level: Not on file  Occupational History   Occupation: Housewife     Employer: [employer]  Tobacco Use   Smoking status: Former    Current packs/day: 0.00    Average packs/day: 1 pack/day for 35.0 years (35.0 ttl pk-yrs)    Types: Cigarettes    Start date: 09/19/1978    Quit date: 09/18/2013    Years since quitting: 9.5   Smokeless tobacco: Never  Vaping Use   Vaping status: Never Used  Substance and Sexual Activity   Alcohol use: No   Drug use: No   Sexual activity: Not on file  Other Topics Concern   Not on file  Social History Narrative   Was in the [employer] x1 year   Social Determinants of Health   Financial Resource Strain: Low Risk  (10/16/2022)   Overall Financial Resource Strain (CARDIA)    Difficulty of Paying Living Expenses: Not hard at all  Food Insecurity: No Food Insecurity (10/16/2022)   Hunger Vital Sign    Worried About Running Out of Food in the Last Year: Never true    Ran Out of Food in the Last Year: Never true  Transportation Needs: No Transportation Needs (10/16/2022)   PRAPARE - Administrator, Civil Service (Medical): No    Lack of Transportation (Non-Medical): No  Physical Activity: Sufficiently Active (10/16/2022)   Exercise Vital Sign    Days of Exercise per Week: 5 days    Minutes of Exercise per Session: 60 min  Stress: No Stress Concern Present (10/16/2022)   [name] of Occupational Health - Occupational Stress Questionnaire    Feeling of Stress : Not at all  Social Connections: Socially Integrated (10/16/2022)   Social Connection and Isolation Panel [NHANES]    Frequency of Communication with Friends and Family: More than three times a week    Frequency of Social Gatherings with Friends and Family: More than three times a week    Attends Religious Services: More than 4 times per year    Active Member of [employer] or Organizations: Yes    Attends Hospital doctor: More than 4 times per year    Marital Status: Married  Catering manager Violence: Not At Risk (10/16/2022)   Humiliation, Afraid, Rape, and Kick questionnaire    Fear of Current or Ex-Partner: No    Emotionally Abused: No    Physically Abused: No    Sexually Abused: No    Physical Exam: Vital signs in last 24 hours: @Pulse  96   Temp 97.9 F (36.6 C) (Tympanic)   Resp 10   Ht 5\' 3"  (1.6 m)   Wt 127 kg   SpO2 97%   BMI 49.60 kg/m  GEN: NAD EYE: Sclerae anicteric ENT: MMM CV: Non-tachycardic Pulm: CTA b/l GI: Soft, NT/ND NEURO:  Alert & Oriented  x 3   Erick Blinks, MD [facility]  04/13/2023 7:21 AM

## 2023-04-13 NOTE — Op Note (Signed)
[facility] Patient Name: [name] Procedure Date: 04/13/2023 MRN: [id number] Attending MD: Beverley Fiedler , MD, [id number] Date of Birth: Apr 28, 1961 CSN: [id number] Age: 62 Admit Type: Outpatient Procedure:                Colonoscopy Indications:              Screening for colorectal malignant neoplasm, Last                            colonoscopy 10 years ago Providers:                Carie Caddy. Rhea Belton, MD, Stephens Shire RN, RN, Adin Hector, RN, Melany Guernsey, Technician Referring MD:             Austin Miles. Okey Dupre, MD Medicines:                Monitored Anesthesia Care Complications:            No immediate complications. Estimated Blood Loss:     Estimated blood loss: none. Procedure:                Pre-Anesthesia Assessment:                           - Prior to the procedure, a History and Physical                            was performed, and patient medications and                            allergies were reviewed. The patient's tolerance of                            previous anesthesia was also reviewed. The risks                            and benefits of the procedure and the sedation                            options and risks were discussed with the patient.                            All questions were answered, and informed consent                            was obtained. Prior Anticoagulants: The patient has                            taken no anticoagulant or antiplatelet agents. ASA                            Grade Assessment: III - A patient with severe  systemic disease. After reviewing the risks and                            benefits, the patient was deemed in satisfactory                            condition to undergo the procedure.                           After obtaining informed consent, the colonoscope                            was passed under direct vision. Throughout the                             procedure, the patient's blood pressure, pulse, and                            oxygen saturations were monitored continuously. The                            CF-HQ190L ([id number]) Olympus colonoscope was                            introduced through the anus and advanced to the                            cecum, identified by appendiceal orifice and                            ileocecal valve. The colonoscopy was performed                            without difficulty. The patient tolerated the                            procedure well. The quality of the bowel                            preparation was good. The ileocecal valve,                            appendiceal orifice, and rectum were photographed. Scope In: 7:40:25 AM Scope Out: 7:53:10 AM Scope Withdrawal Time: 0 hours 10 minutes 14 seconds  Total Procedure Duration: 0 hours 12 minutes 45 seconds  Findings:      The digital rectal exam was normal.      Multiple small-mouthed diverticula were found in the sigmoid colon and       distal descending colon.      Internal hemorrhoids were found during retroflexion. The hemorrhoids       were small.      The exam was otherwise without abnormality. Impression:               - Mild diverticulosis in the sigmoid colon and in  the distal descending colon.                           - Small internal hemorrhoids.                           - The examination was otherwise normal.                           - No specimens collected. Moderate Sedation:      N/A Recommendation:           - Patient has a contact number available for                            emergencies. The signs and symptoms of potential                            delayed complications were discussed with the                            patient. Return to normal activities tomorrow.                            Written discharge instructions were provided to the                             patient.                           - Resume previous diet.                           - Continue present medications.                           - Repeat colonoscopy in 10 years for screening                            purposes. Procedure Code(s):        --- Professional ---                           G2542, Colorectal cancer screening; colonoscopy on                            individual not meeting criteria for high risk Diagnosis Code(s):        --- Professional ---                           Z12.11, Encounter for screening for malignant                            neoplasm of colon                           K64.8, Other hemorrhoids  K57.30, Diverticulosis of large intestine without                            perforation or abscess without bleeding CPT copyright 2022 American Medical Association. All rights reserved. The codes documented in this report are preliminary and upon coder review may  be revised to meet current compliance requirements. Beverley Fiedler, MD 04/13/2023 8:03:21 AM This report has been signed electronically. Number of Addenda: 0

## 2023-04-14 ENCOUNTER — Encounter (HOSPITAL_COMMUNITY): Payer: Self-pay | Admitting: Internal Medicine

## 2023-04-20 NOTE — Progress Notes (Signed)
MRI shows a fracture in the knee and a lot of arthritis. The treatment for the fracture is to keep the weight off of it.  Use a walker and limit weight bearing.  Return to clinic to go over the results in full detail.

## 2023-04-22 ENCOUNTER — Ambulatory Visit: Payer: Medicare (Managed Care) | Admitting: Family Medicine

## 2023-04-22 VITALS — BP 164/90 | HR 99 | Ht 63.0 in

## 2023-04-22 DIAGNOSIS — M1712 Unilateral primary osteoarthritis, left knee: Secondary | ICD-10-CM

## 2023-04-22 DIAGNOSIS — M25562 Pain in left knee: Secondary | ICD-10-CM | POA: Diagnosis not present

## 2023-04-22 DIAGNOSIS — M84452A Pathological fracture, left femur, initial encounter for fracture: Secondary | ICD-10-CM

## 2023-04-22 DIAGNOSIS — G8929 Other chronic pain: Secondary | ICD-10-CM | POA: Diagnosis not present

## 2023-04-22 NOTE — Patient Instructions (Addendum)
Thank you for coming in today.   We will work to authorize gel shots

## 2023-04-22 NOTE — Progress Notes (Addendum)
[name], PhD, LAT, ATC acting as a scribe for Anita Graham, MD.  [name] is a 62 y.o. female who presents to [facility] at [facility] today for f/u L knee pain w/ MRI review. Pt was last seen by Dr. Denyse Amass on 03/10/23 and a MRI was ordered and she was advised to reduce weightbearing. Last L knee steroid injection was on 01/28/23.  Today, pt reports she has been trying to stay off her knee as much as possible. Not using any assistive device. L knee is still very painful.   Dx imaging: 04/07/23 L knee MRI 01/28/23 L knee XR   Pertinent review of systems: No fevers or chills  Relevant historical information: Hypertension   Exam:  BP (!) 164/90   Pulse 99   Ht 5\' 3"  (1.6 m)   SpO2 99%   BMI 49.60 kg/m  General: Well Developed, well nourished, and in no acute distress.   MSK: Left knee large thigh and calf.  Mild knee effusion.  Normal motion.  Tender palpation medial joint line. Some laxity with MCL stress test. Intact strength.    Lab and Radiology Results  EXAM: MRI OF THE LEFT KNEE WITHOUT CONTRAST   TECHNIQUE: Multiplanar, multisequence MR imaging of the knee was performed. No intravenous contrast was administered.   COMPARISON:  None Available.   FINDINGS: MENISCI   Medial: Radial tear of the posterior horn of the medial meniscus with peripheral meniscal extrusion.   Lateral: Intact.   LIGAMENTS   Cruciates: ACL and PCL are intact.   Collaterals: Medial collateral ligament is intact. Lateral collateral ligament complex is intact.   CARTILAGE   Patellofemoral: Partial-thickness cartilage loss of the medial trochlea with areas of full-thickness cartilage loss. High-grade partial-thickness cartilage loss of the medial patellar facet with mild subchondral reactive marrow edema.   Medial: High-grade partial-thickness cartilage loss with areas of full-thickness cartilage loss of the medial femorotibial compartment.    Lateral:  No chondral defect.   JOINT: Moderate joint effusion. Normal Hoffa's fat-pad. No plical thickening.   POPLITEAL FOSSA: Popliteus tendon is intact. Small Baker's cyst.   EXTENSOR MECHANISM: Intact quadriceps tendon. Intact patellar tendon. Intact lateral patellar retinaculum. Intact medial patellar retinaculum. Intact MPFL.   BONES: No aggressive osseous lesion. Subchondral linear low signal area in the weight-bearing surface of the medial femoral condyle with severe surrounding bone marrow edema consistent with a nondisplaced, nondepressed subchondral insufficiency fracture.   Other: No fluid collection or hematoma. Muscles are normal.   IMPRESSION: 1. Radial tear of the posterior horn of the medial meniscus with peripheral meniscal extrusion. 2. Nondepressed, nondisplaced subchondral insufficiency fracture of the weight-bearing surface of the medial femoral condyle with surrounding bone marrow edema. 3. High-grade partial-thickness cartilage loss with areas of full-thickness cartilage loss of the medial femorotibial compartment. 4. Partial-thickness cartilage loss of the medial trochlea with areas of full-thickness cartilage loss. High-grade partial-thickness cartilage loss of the medial patellar facet with mild subchondral reactive marrow edema.     Electronically Signed   By: Elige Ko M.D.   On: 04/20/2023 10:06   I, [name], personally (independently) visualized and performed the interpretation of the images attached in this note.      Assessment and Plan: 62 y.o. female with left knee pain.  Multifactorial.  Recent MRI shows predominantly a nondepressed subchondral insufficiency fracture medial femoral condyle weightbearing surface is associated with full-thickness areas of cartilage loss at the same medial weightbearing surfaces and a  posterior medial meniscus tear.  All of these together are causing increased weightbearing on the knee resulting in  the insufficiency fracture.  Treatment should be primarily offloading.  Recommend using a walker or crutches temporarily.  Will try using a offloading knee brace to help with the instability and arthritis pain that she is having.  Due to quad calf ration custom brace is needed.  Additionally work on authorization for hyaluronic acid injections which we could do.  Weight loss will be helpful as well.  We talked about weight loss strategies.  Ultimately she may require a knee replacement.  Currently BMI of around 50 is too high for knee replacement.  It has to be under 40.   PDMP not reviewed this encounter. No orders of the defined types were placed in this encounter.  No orders of the defined types were placed in this encounter.    Discussed warning signs or symptoms. Please see discharge instructions. Patient expresses understanding.   The above documentation has been reviewed and is accurate and complete [name], M.D.

## 2023-04-23 ENCOUNTER — Telehealth: Payer: Self-pay

## 2023-04-23 NOTE — Telephone Encounter (Signed)
-----   Message from Adron Bene sent at 04/22/2023  2:55 PM EDT ----- Regarding: visco Please auth visco L knee

## 2023-04-23 NOTE — Telephone Encounter (Signed)
VOB initiated for Gelsyn for LEFT knee OA 

## 2023-04-24 NOTE — Telephone Encounter (Signed)
Prior Auth REQUIRED for [employer]

## 2023-04-28 NOTE — Telephone Encounter (Signed)
Prior Auth initiated via [employer] Prior Auth (EOC) ID:  [id number]

## 2023-05-06 ENCOUNTER — Ambulatory Visit (INDEPENDENT_AMBULATORY_CARE_PROVIDER_SITE_OTHER): Payer: Medicare (Managed Care) | Admitting: Internal Medicine

## 2023-05-06 ENCOUNTER — Encounter: Payer: Self-pay | Admitting: Internal Medicine

## 2023-05-06 VITALS — BP 132/84 | HR 75 | Temp 98.2°F | Ht 63.0 in | Wt 298.0 lb

## 2023-05-06 DIAGNOSIS — R0683 Snoring: Secondary | ICD-10-CM

## 2023-05-06 NOTE — Patient Instructions (Signed)
We will do the home sleep test to get checked.

## 2023-05-06 NOTE — Progress Notes (Signed)
   Subjective:   Patient ID: [name], female    DOB: [birth year], 62 y.o.   MRN: [id number]  HPI The patient is a 62 YO female coming in for concerns about sleep apnea. Snoring loudly and tired during the day.   Review of Systems  Constitutional:  Positive for fatigue.  HENT: Negative.    Eyes: Negative.   Respiratory:  Negative for cough, chest tightness and shortness of breath.   Cardiovascular:  Negative for chest pain, palpitations and leg swelling.  Gastrointestinal:  Negative for abdominal distention, abdominal pain, constipation, diarrhea, nausea and vomiting.  Musculoskeletal: Negative.   Skin: Negative.   Neurological: Negative.   Psychiatric/Behavioral: Negative.      Objective:  Physical Exam Constitutional:      Appearance: She is well-developed. She is obese.  HENT:     Head: Normocephalic and atraumatic.  Cardiovascular:     Rate and Rhythm: Normal rate and regular rhythm.  Pulmonary:     Effort: Pulmonary effort is normal. No respiratory distress.     Breath sounds: Normal breath sounds. No wheezing or rales.  Abdominal:     General: Bowel sounds are normal. There is no distension.     Palpations: Abdomen is soft.     Tenderness: There is no abdominal tenderness. There is no rebound.  Musculoskeletal:     Cervical back: Normal range of motion.  Skin:    General: Skin is warm and dry.  Neurological:     Mental Status: She is alert and oriented to person, place, and time.     Coordination: Coordination normal.     Vitals:   05/06/23 1024 05/06/23 1027 05/06/23 1035  BP: (!) 140/100 (!) 140/100 132/84  Pulse: 75    Temp: 98.2 F (36.8 C)    TempSrc: Oral    SpO2: 98%    Weight: 298 lb (135.2 kg)    Height: 5\' 3"  (1.6 m)      Assessment & Plan:

## 2023-05-07 NOTE — Telephone Encounter (Signed)
Prior auth initiated via FAX form.

## 2023-05-07 NOTE — Telephone Encounter (Signed)
Response received  "We received your request but was unable to process as ESI reviews Part D requests".   This was run for part B benefits, not part D.   Will attempt prior auth via fax form.

## 2023-05-08 DIAGNOSIS — R0683 Snoring: Secondary | ICD-10-CM | POA: Insufficient documentation

## 2023-05-08 NOTE — Assessment & Plan Note (Signed)
Ordered home sleep test and given BMI and snoring and fatigue likelihood is high for OSA.

## 2023-05-11 NOTE — Telephone Encounter (Signed)
[name] called asking for the xray and mri report to be faxed to them at [phone] with reference # [id number].

## 2023-05-11 NOTE — Telephone Encounter (Signed)
Fax received stating:  "Misdirected to [facility] coverage review department, customer has Medicare account".   Prior Auth form re-faxed to [facility] at number provider in provider manual [phone].

## 2023-05-12 NOTE — Telephone Encounter (Signed)
XR and MRI reports printed and placed at the front desk for faxing.

## 2023-05-13 NOTE — Telephone Encounter (Signed)
GELSYN for LEFT knee OA   Primary Insurance: [employer]vantage Co-pay: $30 Co-insurance: 20% Deductible: does not apply  Prior Auth: APPROVED PA# [id number] Valid: 05/11/23-11/07/23 504 units    Knee Injection History 01/28/23 - Kenalog LEFT

## 2023-05-13 NOTE — Telephone Encounter (Signed)
Prior Auth for [facility] for LEFT knee OA APPROVED PA# [id number] Valid: 05/11/23-11/07/23 504 units

## 2023-05-19 ENCOUNTER — Telehealth: Payer: Self-pay | Admitting: Internal Medicine

## 2023-05-19 ENCOUNTER — Other Ambulatory Visit: Payer: Self-pay

## 2023-05-19 MED ORDER — TRIAMCINOLONE ACETONIDE 0.1 % EX CREA: TOPICAL_CREAM | CUTANEOUS | 3 refills | Status: DC

## 2023-05-19 MED ORDER — TELMISARTAN-HCTZ 80-25 MG PO TABS: ORAL_TABLET | ORAL | 3 refills | Status: DC

## 2023-05-19 NOTE — Telephone Encounter (Signed)
Prescription Request  05/19/2023  LOV: 05/06/2023  What is the name of the medication or equipment? telmisartan-hydrochlorothiazide (MICARDIS HCT) 80-25 MG tablet  triamcinolone cream (KENALOG) 0.1 %   Have you contacted your pharmacy to request a refill? No   Which pharmacy would you like this sent to?    [id number] [place], [place] [facility] - [place], [place] - [address] [place] [address] Phone: [phone] Fax: [phone]  Patient notified that their request is being sent to the clinical staff for review and that they should receive a response within 2 business days.   Please advise at Mobile [phone] (mobile)

## 2023-05-20 ENCOUNTER — Other Ambulatory Visit: Payer: Self-pay

## 2023-05-20 MED ORDER — TELMISARTAN-HCTZ 80-25 MG PO TABS: ORAL_TABLET | ORAL | 3 refills | Status: DC

## 2023-05-20 MED ORDER — TRIAMCINOLONE ACETONIDE 0.1 % EX CREA: TOPICAL_CREAM | CUTANEOUS | 3 refills | Status: DC

## 2023-05-20 NOTE — Telephone Encounter (Signed)
Sent in

## 2023-05-28 DIAGNOSIS — M1712 Unilateral primary osteoarthritis, left knee: Secondary | ICD-10-CM | POA: Diagnosis not present

## 2023-06-26 ENCOUNTER — Other Ambulatory Visit: Payer: Self-pay | Admitting: Internal Medicine

## 2023-06-26 DIAGNOSIS — Z1231 Encounter for screening mammogram for malignant neoplasm of breast: Secondary | ICD-10-CM

## 2023-07-08 ENCOUNTER — Ambulatory Visit (HOSPITAL_BASED_OUTPATIENT_CLINIC_OR_DEPARTMENT_OTHER): Payer: Medicare (Managed Care) | Attending: Internal Medicine | Admitting: Internal Medicine

## 2023-07-08 DIAGNOSIS — R0683 Snoring: Secondary | ICD-10-CM | POA: Diagnosis not present

## 2023-07-08 DIAGNOSIS — G4733 Obstructive sleep apnea (adult) (pediatric): Secondary | ICD-10-CM | POA: Insufficient documentation

## 2023-07-18 DIAGNOSIS — R0683 Snoring: Secondary | ICD-10-CM | POA: Diagnosis not present

## 2023-07-18 NOTE — Procedures (Signed)
    Patient Name: [name], [name] Date: 07/08/2023 Gender: Female D.O.B: 08-19-1960 Age (years): 62 Referring Provider: Hillard Danker Height (inches): 63 Interpreting Physician: Jetty Duhamel MD, ABSM Weight (lbs): 298 RPSGT: [place] Sink BMI: 53 MRN: [id number] Neck Size: <br>  CLINICAL INFORMATION Sleep Study Type: HST Indication for sleep study: OSA Epworth Sleepiness Score: 1  SLEEP STUDY TECHNIQUE A multi-channel overnight portable sleep study was performed. The channels recorded were: nasal airflow, thoracic respiratory movement, and oxygen saturation with a pulse oximetry. Snoring was also monitored.  MEDICATIONS Patient self administered medications include: N/A.  SLEEP ARCHITECTURE Patient was studied for 367.5 minutes. The sleep efficiency was 100.0 % and the patient was supine for 0%. The arousal index was 0.0 per hour.  RESPIRATORY PARAMETERS The overall AHI was 11.9 per hour, with a central apnea index of 0 per hour. The oxygen nadir was 89% during sleep.  CARDIAC DATA Mean heart rate during sleep was 82.5 bpm.  IMPRESSIONS - Mild obstructive sleep apnea occurred during this study (AHI = 11.9/h). - Mild oxygen desaturation was noted during this study (Min O2 = 89%, Mean 96%). - No snoring was audible during this study.  DIAGNOSIS - Obstructive Sleep Apnea (G47.33)  RECOMMENDATIONS - Suggest CPAP titration sleep study, autopap or a fitted oral appliance. Other options would be based on clinical judgment. - Be careful with alcohol, sedatives and other CNS depressants that may worsen sleep apnea and disrupt normal sleep architecture. - Sleep hygiene should be reviewed to assess factors that may improve sleep quality. - Weight management and regular exercise should be initiated or continued.  [Electronically signed] 07/18/2023 03:59 PM  Jetty Duhamel MD, ABSM Diplomate, American Board of Sleep Medicine NPI: [id number]                          Jetty Duhamel Diplomate, American Board of Sleep Medicine  ELECTRONICALLY SIGNED ON:  07/18/2023, 3:53 PM [facility] PH: [phone]   FX: [phone] ACCREDITED BY THE AMERICAN ACADEMY OF SLEEP MEDICINE

## 2023-07-20 ENCOUNTER — Telehealth: Payer: Self-pay | Admitting: Internal Medicine

## 2023-07-20 DIAGNOSIS — G4733 Obstructive sleep apnea (adult) (pediatric): Secondary | ICD-10-CM

## 2023-07-20 NOTE — Telephone Encounter (Signed)
You do have mild sleep apnea. Would you like to try a CPAP device?

## 2023-07-20 NOTE — Telephone Encounter (Signed)
Please advise so I can inform patient

## 2023-07-21 DIAGNOSIS — G4733 Obstructive sleep apnea (adult) (pediatric): Secondary | ICD-10-CM | POA: Insufficient documentation

## 2023-07-21 NOTE — Addendum Note (Signed)
 Addended by: Hillard Danker A on: 07/21/2023 08:25 AM   Modules accepted: Orders

## 2023-07-21 NOTE — Telephone Encounter (Signed)
 Ordered for her

## 2023-07-21 NOTE — Telephone Encounter (Signed)
Patient agrees to CPAP machine

## 2023-07-29 ENCOUNTER — Ambulatory Visit
Admission: RE | Admit: 2023-07-29 | Discharge: 2023-07-29 | Disposition: A | Discharge status: Home / Self Care | Payer: Medicare (Managed Care) | Source: Ambulatory Visit | Attending: Internal Medicine | Admitting: Internal Medicine

## 2023-07-29 DIAGNOSIS — Z1231 Encounter for screening mammogram for malignant neoplasm of breast: Secondary | ICD-10-CM

## 2023-09-01 ENCOUNTER — Ambulatory Visit: Payer: Self-pay | Admitting: Internal Medicine

## 2023-09-01 NOTE — Telephone Encounter (Unsigned)
Copied from CRM [id number]. Topic: Clinical - Pink Word Triage >> Sep 01, 2023  9:22 AM Fredrich Romans wrote: Reason for Triage: pain on lower right side for 3 days,then stopped,came back after drinking a soda. Questions about possible appendix issue

## 2023-09-01 NOTE — Telephone Encounter (Signed)
   Chief Complaint: Abdominal pain Symptoms: Pain to right lower quadrant Frequency: occurred over three days-no pain currently Pertinent Negatives: Patient denies Nausea/vomiting Disposition: [] ED /[] Urgent Care (no appt availability in office) / [x] Appointment(In office/virtual)/ []  [facility]/ [] Home Care/ [] Refused Recommended Disposition /[] [facility]/ []  Follow-up with PCP Additional Notes: patient with hx of hysterectomy and gallbladder removal complains of right sided abdominal pain that was 10 out of 10 for three days. Patient states she is currently not in any pain. Patient states pain occurred last week for three days and then went away but came back after drinking a soda. Per protocol, an appointment is the recommendation. Appointment made for 09/02/2023 at 10:40 am. Patient verbalized understanding of plan and all questions answered.      Copied from CRM [id number]. Topic: Clinical - Pink Word Triage >> Sep 01, 2023  9:22 AM Fredrich Romans wrote: Reason for Triage: pain on lower right side for 3 days,then stopped,came back after drinking a soda. Questions about possible appendix issue     Reason for Disposition  Age > 60 years  Answer Assessment - Initial Assessment Questions 1. LOCATION: "Where does it hurt?"      Right lower abdominal pain 2. RADIATION: "Does the pain shoot anywhere else?" (e.g., chest, back)     Usually doesn't move 3. ONSET: "When did the pain begin?" (e.g., minutes, hours or days ago)      Started over a week ago-went away and then came back after drinking a soda 4. SUDDEN: "Gradual or sudden onset?"     Sudden 5. PATTERN "Does the pain come and go, or is it constant?"    - If it comes and goes: "How long does it last?" "Do you have pain now?"     (Note: Comes and goes means the pain is intermittent. It goes away completely between bouts.)    - If constant: "Is it getting better, staying the same, or getting worse?"      (Note: Constant  means the pain never goes away completely; most serious pain is constant and gets worse.)      Pain is constant when it comes on 6. SEVERITY: "How bad is the pain?"  (e.g., Scale 1-10; mild, moderate, or severe)    - MILD (1-3): Doesn't interfere with normal activities, abdomen soft and not tender to touch.     - MODERATE (4-7): Interferes with normal activities or awakens from sleep, abdomen tender to touch.     - SEVERE (8-10): Excruciating pain, doubled over, unable to do any normal activities.       No pain currently 7. RECURRENT SYMPTOM: "Have you ever had this type of stomach pain before?" If Yes, ask: "When was the last time?" and "What happened that time?"      No 8. CAUSE: "What do you think is causing the stomach pain?"     Unsure of what could be causing it but it unsure if pain was caused by something particular 9. RELIEVING/AGGRAVATING FACTORS: "What makes it better or worse?" (e.g., antacids, bending or twisting motion, bowel movement)     Currently not having any pain 10. OTHER SYMPTOMS: "Do you have any other symptoms?" (e.g., back pain, diarrhea, fever, urination pain, vomiting)       No current symptoms  Protocols used: Abdominal Pain - Female-A-AH

## 2023-09-02 ENCOUNTER — Encounter: Payer: Self-pay | Admitting: Internal Medicine

## 2023-09-02 ENCOUNTER — Ambulatory Visit: Payer: Medicare (Managed Care) | Admitting: Internal Medicine

## 2023-09-02 VITALS — BP 120/96 | HR 80 | Temp 97.8°F | Ht 63.0 in | Wt 295.0 lb

## 2023-09-02 DIAGNOSIS — R1031 Right lower quadrant pain: Secondary | ICD-10-CM | POA: Diagnosis not present

## 2023-09-02 LAB — COMPREHENSIVE METABOLIC PANEL
ALT: 7 U/L (ref 0–35)
AST: 12 U/L (ref 0–37)
Albumin: 4.1 g/dL (ref 3.5–5.2)
Alkaline Phosphatase: 137 U/L — ABNORMAL HIGH (ref 39–117)
BUN: 13 mg/dL (ref 6–23)
CO2: 31 meq/L (ref 19–32)
Calcium: 9.4 mg/dL (ref 8.4–10.5)
Chloride: 102 meq/L (ref 96–112)
Creatinine, Ser: 0.84 mg/dL (ref 0.40–1.20)
GFR: 73.92 mL/min (ref >60.00)
Glucose, Bld: 91 mg/dL (ref 70–99)
Potassium: 3.7 meq/L (ref 3.5–5.1)
Sodium: 141 meq/L (ref 135–145)
Total Bilirubin: 0.8 mg/dL (ref 0.2–1.2)
Total Protein: 8.2 g/dL (ref 6.0–8.3)

## 2023-09-02 LAB — URINALYSIS, ROUTINE W REFLEX MICROSCOPIC
Bilirubin Urine: NEGATIVE
Ketones, ur: NEGATIVE
Nitrite: NEGATIVE
Specific Gravity, Urine: 1.02 (ref 1.000–1.030)
Urine Glucose: NEGATIVE
Urobilinogen, UA: 2 — AB (ref 0.0–1.0)
pH: 6.5 (ref 5.0–8.0)

## 2023-09-02 LAB — CBC
HCT: 39.3 % (ref 36.0–46.0)
Hemoglobin: 12.7 g/dL (ref 12.0–15.0)
MCHC: 32.2 g/dL (ref 30.0–36.0)
MCV: 86.7 fL (ref 78.0–100.0)
Platelets: 262 10*3/uL (ref 150.0–400.0)
RBC: 4.53 Mil/uL (ref 3.87–5.11)
RDW: 13.6 % (ref 11.5–15.5)
WBC: 7.3 10*3/uL (ref 4.0–10.5)

## 2023-09-02 LAB — LIPASE: Lipase: 38 U/L (ref 11.0–59.0)

## 2023-09-02 NOTE — Patient Instructions (Signed)
We will check the labs and urine today. If normal we will watch this for another week or two. If not improving or if worsening let us know and we can do a CT scan on the stomach.

## 2023-09-02 NOTE — Progress Notes (Unsigned)
   Subjective:   Patient ID: [name], female    DOB: 11-25-1960, 63 y.o.   MRN: [id number]  HPI The patient is a 63 YO female coming in for abdominal pain right sided for 3 weeks now. Last colonoscopy Sept 2024 with mild diverticulosis and internal hemorrhoids otherwise normal. Lung CT sept 2024 with staghorn calculi right kidney. Prior cholecystectomy. This was more moderate initially and was present all the time with worsening with movement and bending/twisting. This has decreased and is now mild mostly with moderate flares. No fevers or chills. Unclear if urinary symptoms.   Review of Systems  Constitutional: Negative.   HENT: Negative.    Eyes: Negative.   Respiratory:  Negative for cough, chest tightness and shortness of breath.   Cardiovascular:  Negative for chest pain, palpitations and leg swelling.  Gastrointestinal:  Positive for abdominal pain. Negative for abdominal distention, constipation, diarrhea, nausea and vomiting.  Musculoskeletal: Negative.   Skin: Negative.   Neurological: Negative.   Psychiatric/Behavioral: Negative.      Objective:  Physical Exam Constitutional:      Appearance: She is well-developed.  HENT:     Head: Normocephalic and atraumatic.  Cardiovascular:     Rate and Rhythm: Normal rate and regular rhythm.  Pulmonary:     Effort: Pulmonary effort is normal. No respiratory distress.     Breath sounds: Normal breath sounds. No wheezing or rales.  Abdominal:     General: Bowel sounds are normal. There is no distension.     Palpations: Abdomen is soft.     Tenderness: There is abdominal tenderness in the right lower quadrant. There is no rebound.  Musculoskeletal:     Cervical back: Normal range of motion.  Skin:    General: Skin is warm and dry.  Neurological:     Mental Status: She is alert and oriented to person, place, and time.     Coordination: Coordination normal.     Vitals:   09/02/23 1047  BP: (!) 120/96  Pulse: 80  Temp:  97.8 F (36.6 C)  TempSrc: Oral  SpO2: 98%  Weight: 295 lb (133.8 kg)  Height: 5\' 3"  (1.6 m)    Assessment & Plan:

## 2023-09-03 ENCOUNTER — Encounter: Payer: Self-pay | Admitting: Internal Medicine

## 2023-09-03 ENCOUNTER — Other Ambulatory Visit: Payer: Self-pay | Admitting: Internal Medicine

## 2023-09-03 MED ORDER — SULFAMETHOXAZOLE-TRIMETHOPRIM 800-160 MG PO TABS
1.0000 | ORAL_TABLET | Freq: Two times a day (BID) | ORAL | 0 refills | Status: DC
Start: 2023-09-03 — End: 2023-10-20

## 2023-09-04 ENCOUNTER — Encounter: Payer: Self-pay | Admitting: Internal Medicine

## 2023-09-04 DIAGNOSIS — R1031 Right lower quadrant pain: Secondary | ICD-10-CM | POA: Insufficient documentation

## 2023-09-04 NOTE — Assessment & Plan Note (Signed)
Checking U/A and CMP and CBC and lipase. She has known staghorn calculus right kidney which could be causing pain/problems. Treat as appropriate and if all normal will likely need CT abdomen and pelvis.

## 2023-10-19 ENCOUNTER — Telehealth: Payer: Medicare (Managed Care) | Admitting: Family Medicine

## 2023-10-19 NOTE — Progress Notes (Signed)
 Pt did not show up for visit-DWB

## 2023-10-20 ENCOUNTER — Ambulatory Visit (INDEPENDENT_AMBULATORY_CARE_PROVIDER_SITE_OTHER): Payer: Medicare (Managed Care)

## 2023-10-20 VITALS — Ht 63.0 in | Wt 295.0 lb

## 2023-10-20 DIAGNOSIS — Z0000 Encounter for general adult medical examination without abnormal findings: Secondary | ICD-10-CM

## 2023-10-20 DIAGNOSIS — Z122 Encounter for screening for malignant neoplasm of respiratory organs: Secondary | ICD-10-CM

## 2023-10-20 DIAGNOSIS — Z87891 Personal history of nicotine dependence: Secondary | ICD-10-CM

## 2023-10-20 NOTE — Patient Instructions (Addendum)
 Ms. Mayer , Thank you for taking time to come for your Medicare Wellness Visit. I appreciate your ongoing commitment to your health goals. Please review the following plan we discussed and let me know if I can assist you in the future.   Referrals/Orders/Follow-Ups/Clinician Recommendations: Aim for 30 minutes of exercise or brisk walking, 6-8 glasses of water, and 5 servings of fruits and vegetables each day.   This is a list of the screening recommended for you and due dates:  Health Maintenance  Topic Date Due   DTaP/Tdap/Td vaccine (2 - Td or Tdap) 07/21/2022   Flu Shot  02/19/2024   Screening for Lung Cancer  03/12/2024   Medicare Annual Wellness Visit  10/19/2024   Mammogram  07/28/2025   Colon Cancer Screening  04/12/2033   COVID-19 Vaccine  Completed   Hepatitis C Screening  Completed   HIV Screening  Completed   Zoster (Shingles) Vaccine  Completed   HPV Vaccine  Aged Out    Advanced directives: (Provided) Advance directive discussed with you today. I have provided a copy for you to complete at home and have notarized. Once this is complete, please bring a copy in to our office so we can scan it into your chart.   Next Medicare Annual Wellness Visit scheduled for next year: Yes

## 2023-10-20 NOTE — Telephone Encounter (Signed)
 Patient not seen yet this year and no current future appointment with [place]. Will run once patient states they would like this injection again

## 2023-10-20 NOTE — Progress Notes (Signed)
 Subjective:   [name] is a 63 y.o. who presents for a Medicare Wellness preventive visit.  Visit Complete: Virtual I connected with  [name] on 10/20/23 by a audio enabled telemedicine application and verified that I am speaking with the correct person using two identifiers.  Patient Location: Home  Provider Location: Office/Clinic  I discussed the limitations of evaluation and management by telemedicine. The patient expressed understanding and agreed to proceed.  Vital Signs: Because this visit was a virtual/telehealth visit, some criteria may be missing or patient reported. Any vitals not documented were not able to be obtained and vitals that have been documented are patient reported.  VideoDeclined- This patient declined Librarian, academic. Therefore the visit was completed with audio only.  Persons Participating in Visit: Patient.  AWV Questionnaire: No: Patient Medicare AWV questionnaire was not completed prior to this visit.  Cardiac Risk Factors include: advanced age (>50men, >37 women);obesity (BMI >30kg/m2);dyslipidemia     Objective:    Today's Vitals   10/20/23 1313  Weight: 295 lb (133.8 kg)  Height: 5\' 3"  (1.6 m)   Body mass index is 52.26 kg/m.     10/20/2023    1:12 PM 04/13/2023    6:29 AM 10/16/2022    1:04 PM 09/27/2018   12:42 AM 07/30/2015    9:15 PM 07/27/2015    2:03 PM 07/15/2015   12:00 PM  Advanced Directives  Does Patient Have a Medical Advance Directive? Yes No No No No No No  Type of Estate agent of [place];Living will        Copy of Healthcare Power of Attorney in Chart? No - copy requested        Would patient like information on creating a medical advance directive?  No - Patient declined No - Patient declined No - Patient declined No - patient declined information No - patient declined information No - patient declined information    Current Medications (verified) Outpatient  Encounter Medications as of 10/20/2023  Medication Sig   acetaminophen (TYLENOL) 650 MG CR tablet Take 1,300 mg by mouth every 8 (eight) hours as needed for pain.   cromolyn (NASALCROM) 5.2 MG/ACT nasal spray Place 1 spray into both nostrils 4 (four) times daily. (Patient taking differently: Place 1 spray into both nostrils 4 (four) times daily as needed for allergies.)   diclofenac (VOLTAREN) 75 MG EC tablet Take 1 tablet (75 mg total) by mouth 2 (two) times daily.   fluticasone (FLONASE) 50 MCG/ACT nasal spray Place 2 sprays into both nostrils daily as needed for allergies.    ibuprofen (ADVIL,MOTRIN) 200 MG tablet Take 200 mg by mouth every 8 (eight) hours as needed (pain.).   loratadine (CLARITIN) 10 MG tablet Take 10 mg by mouth daily as needed for allergies.   Multiple Vitamin (MULTIVITAMIN WITH MINERALS) TABS tablet Take 1 tablet by mouth daily.   telmisartan-hydrochlorothiazide (MICARDIS HCT) 80-25 MG tablet TAKE 1 TABLET BY MOUTH DAILY. PLEASE CALL OUR OFFICE TO SCHEDULE A FOLLOW UP   triamcinolone cream (KENALOG) 0.1 % APPLY TO AFFECTED AREA TWICE A DAY   TURMERIC CURCUMIN PO Take 500 mg by mouth in the morning.   [DISCONTINUED] sulfamethoxazole-trimethoprim (BACTRIM DS) 800-160 MG tablet Take 1 tablet by mouth 2 (two) times daily.   No facility-administered encounter medications on file as of 10/20/2023.    Allergies (verified) Penicillins   History: Past Medical History:  Diagnosis Date   Arthritis    Bronchitis  Complication of anesthesia    hard to wake up years ago   Environmental and seasonal allergies    dust mites, dust, cats,trees,   GERD (gastroesophageal reflux disease)    Headache    Heart murmur    Hypertension    Shortness of breath dyspnea    with exertion   Past Surgical History:  Procedure Laterality Date   ABDOMINAL HYSTERECTOMY     CHOLECYSTECTOMY N/A 09/27/2018   Procedure: LAPAROSCOPIC CHOLECYSTECTOMY;  Surgeon: Harriette Bouillon, MD;  Location: [facility] OR;   Service: General;  Laterality: N/A;   COLONOSCOPY WITH PROPOFOL N/A 04/13/2023   Procedure: COLONOSCOPY WITH PROPOFOL;  Surgeon: Beverley Fiedler, MD;  Location: [facility];  Service: Gastroenterology;  Laterality: N/A;   CYST REMOVAL HAND     NASAL SINUS SURGERY     ORIF ANKLE FRACTURE Right 07/30/2015   Procedure: OPEN REDUCTION INTERNAL FIXATION (ORIF) RIGHT  ANKLE ;  Surgeon: Samson Frederic, MD;  Location: [facility];  Service: Orthopedics;  Laterality: Right;   Family History  Problem Relation Age of Onset   Aneurysm Brother    Colon cancer Neg Hx    Liver disease Neg Hx    Esophageal cancer Neg Hx    Social History   Socioeconomic History   Marital status: Married    Spouse name: Not on file   Number of children: 2   Years of education: Not on file   Highest education level: Not on file  Occupational History   Occupation: Housewife     Employer: [employer]  Tobacco Use   Smoking status: Former    Current packs/day: 0.00    Average packs/day: 1 pack/day for 35.0 years (35.0 ttl pk-yrs)    Types: Cigarettes    Start date: 09/19/1978    Quit date: 09/18/2013    Years since quitting: 10.0    Passive exposure: Past   Smokeless tobacco: Never  Vaping Use   Vaping status: Never Used  Substance and Sexual Activity   Alcohol use: No   Drug use: No   Sexual activity: Not on file  Other Topics Concern   Not on file  Social History Narrative   Was in the [employer] x1 year   Social Drivers of Health   Financial Resource Strain: Low Risk  (10/20/2023)   Overall Financial Resource Strain (CARDIA)    Difficulty of Paying Living Expenses: Not hard at all  Food Insecurity: No Food Insecurity (10/20/2023)   Hunger Vital Sign    Worried About Running Out of Food in the Last Year: Never true    Ran Out of Food in the Last Year: Never true  Transportation Needs: No Transportation Needs (10/20/2023)   PRAPARE - Administrator, Civil Service (Medical): No    Lack of  Transportation (Non-Medical): No  Physical Activity: Sufficiently Active (10/20/2023)   Exercise Vital Sign    Days of Exercise per Week: 5 days    Minutes of Exercise per Session: 150+ min  Stress: No Stress Concern Present (10/20/2023)   [name] of Occupational Health - Occupational Stress Questionnaire    Feeling of Stress : Not at all  Social Connections: Socially Integrated (10/20/2023)   Social Connection and Isolation Panel [NHANES]    Frequency of Communication with Friends and Family: More than three times a week    Frequency of Social Gatherings with Friends and Family: More than three times a week    Attends Religious Services: More than 4  times per year    Active Member of Clubs or Organizations: Yes    Attends Banker Meetings: More than 4 times per year    Marital Status: Married    Tobacco Counseling Counseling given: No    Clinical Intake:  Pre-visit preparation completed: Yes  Pain : No/denies pain     BMI - recorded: 52.26 Nutritional Status: BMI > 30  Obese Nutritional Risks: None  Lab Results  Component Value Date   HGBA1C 5.9 10/20/2022   HGBA1C 5.9 10/14/2021   HGBA1C 5.9 10/11/2020     How often do you need to have someone help you when you read instructions, pamphlets, or other written materials from your doctor or pharmacy?: 1 - Never  Interpreter Needed?: No  Information entered by :: Hassell Halim, CMA   Activities of Daily Living     10/20/2023    1:16 PM  In your present state of health, do you have any difficulty performing the following activities:  Hearing? 0  Vision? 0  Difficulty concentrating or making decisions? 0  Walking or climbing stairs? 0  Dressing or bathing? 0  Doing errands, shopping? 0  Preparing Food and eating ? N  Using the Toilet? N  In the past six months, have you accidently leaked urine? N  Do you have problems with loss of bowel control? N  Managing your Medications? N  Managing your  Finances? N  Housekeeping or managing your Housekeeping? N    Patient Care Team: Myrlene Broker, MD as PCP - General (Internal Medicine)  Indicate any recent Medical Services you may have received from other than [facility] providers in the past year (date may be approximate).     Assessment:   This is a routine wellness examination for [name].  Hearing/Vision screen Hearing Screening - Comments:: Denies hearing difficulties   Vision Screening - Comments:: Wears rx glasses - up to date with routine eye exams with [facility]   Goals Addressed               This Visit's Progress     Weight (lb) < 200 lb (90.7 kg) (pt-stated)   295 lb (133.8 kg)     Patient stated she's wanting to lose about 110lbs.       Depression Screen     10/20/2023    1:21 PM 09/02/2023   10:47 AM 11/24/2022   10:31 AM 11/24/2022   10:30 AM 10/16/2022    1:07 PM 10/14/2021    1:02 PM 10/11/2020   12:54 PM  PHQ 2/9 Scores  PHQ - 2 Score 0 0 0 0 0 0 0  PHQ- 9 Score 1  0 0 0      Fall Risk     10/20/2023    1:16 PM 05/06/2023   10:27 AM 11/24/2022   10:30 AM 10/20/2022    1:03 PM 10/16/2022    1:06 PM  Fall Risk   Falls in the past year? 1 1 0 0 0  Number falls in past yr: 0 0 0 0 0  Injury with Fall? 1 1 0 0 0  Comment fractured left knee      Risk for fall due to :   No Fall Risks No Fall Risks No Fall Risks  Follow up Falls evaluation completed;Falls prevention discussed Falls evaluation completed   Falls prevention discussed    MEDICARE RISK AT HOME:  Medicare Risk at Home Any stairs in or around the home?:  No (outside) If so, are there any without handrails?: No Home free of loose throw rugs in walkways, pet beds, electrical cords, etc?: Yes Adequate lighting in your home to reduce risk of falls?: Yes Life alert?: No Use of a cane, walker or w/c?: No Grab bars in the bathroom?: No Shower chair or bench in shower?: Yes Elevated toilet seat or a handicapped toilet?: Yes  TIMED UP AND  GO:  Was the test performed?  No  Cognitive Function: 6CIT completed        10/20/2023    1:20 PM 10/16/2022    1:08 PM  6CIT Screen  What Year? 0 points 0 points  What month? 0 points 0 points  What time? 0 points 0 points  Count back from 20 0 points 0 points  Months in reverse 0 points 0 points  Repeat phrase 0 points 0 points  Total Score 0 points 0 points    Immunizations Immunization History  Administered Date(s) Administered   Influenza Inj Mdck Quad Pf 03/25/2020   Influenza,inj,Quad PF,6+ Mos 07/01/2018, 05/08/2019, 04/26/2021   Influenza-Unspecified 04/25/2023   PFIZER(Purple Top)SARS-COV-2 Vaccination 01/31/2020, 03/20/2020, 07/06/2020   Pfizer Covid-19 Vaccine Bivalent Booster 69yrs & up 04/26/2021   Tdap 07/21/2012   Unspecified SARS-COV-2 Vaccination 04/25/2023   Zoster Recombinant(Shingrix) 10/19/2017, 02/22/2018    Screening Tests Health Maintenance  Topic Date Due   DTaP/Tdap/Td (2 - Td or Tdap) 07/21/2022   INFLUENZA VACCINE  02/19/2024   Lung Cancer Screening  03/12/2024   Medicare Annual Wellness (AWV)  10/19/2024   MAMMOGRAM  07/28/2025   Colonoscopy  04/12/2033   COVID-19 Vaccine  Completed   Hepatitis C Screening  Completed   HIV Screening  Completed   Zoster Vaccines- Shingrix  Completed   HPV VACCINES  Aged Out    Health Maintenance  Health Maintenance Due  Topic Date Due   DTaP/Tdap/Td (2 - Td or Tdap) 07/21/2022   Health Maintenance Items Addressed:10/20/2023 Lung Cancer Screening ordered  Additional Screening:  Vision Screening: Recommended annual ophthalmology exams for early detection of glaucoma and other disorders of the eye. Pt stated that she's being followed for routine eye exams with [facility].  Dental Screening: Recommended annual dental exams for proper oral hygiene  Community Resource Referral / Chronic Care Management: CRR required this visit?  No   CCM required this visit?  No     Plan:     I have  personally reviewed and noted the following in the patient's chart:   Medical and social history Use of alcohol, tobacco or illicit drugs  Current medications and supplements including opioid prescriptions. Patient is not currently taking opioid prescriptions. Functional ability and status Nutritional status Physical activity Advanced directives List of other physicians Hospitalizations, surgeries, and ER visits in previous 12 months Vitals Screenings to include cognitive, depression, and falls Referrals and appointments  In addition, I have reviewed and discussed with patient certain preventive protocols, quality metrics, and best practice recommendations. A written personalized care plan for preventive services as well as general preventive health recommendations were provided to patient.     Darreld Mclean, CMA   10/20/2023   After Visit Summary: (MyChart) Due to this being a telephonic visit, the after visit summary with patients personalized plan was offered to patient via MyChart   Notes: Nothing significant to report at this time.

## 2023-10-21 NOTE — Progress Notes (Signed)
 Subjective:   [name] is a 63 y.o. who presents for a Medicare Wellness preventive visit.  Visit Complete: Virtual I connected with  [name] on 10/21/23 by a audio enabled telemedicine application and verified that I am speaking with the correct person using two identifiers.  Patient Location: Home  Provider Location: Office/Clinic  I discussed the limitations of evaluation and management by telemedicine. The patient expressed understanding and agreed to proceed.  Vital Signs: Because this visit was a virtual/telehealth visit, some criteria may be missing or patient reported. Any vitals not documented were not able to be obtained and vitals that have been documented are patient reported.  VideoDeclined- This patient declined Librarian, academic. Therefore the visit was completed with audio only.  Persons Participating in Visit: Patient.  AWV Questionnaire: No: Patient Medicare AWV questionnaire was not completed prior to this visit.  Cardiac Risk Factors include: advanced age (>108men, >2 women);obesity (BMI >30kg/m2);dyslipidemia     Objective:    Today's Vitals   10/20/23 1313  Weight: 295 lb (133.8 kg)  Height: 5\' 3"  (1.6 m)   Body mass index is 52.26 kg/m.     10/20/2023    1:12 PM 04/13/2023    6:29 AM 10/16/2022    1:04 PM 09/27/2018   12:42 AM 07/30/2015    9:15 PM 07/27/2015    2:03 PM 07/15/2015   12:00 PM  Advanced Directives  Does Patient Have a Medical Advance Directive? Yes No No No No No No  Type of Estate agent of [place];Living will        Copy of Healthcare Power of Attorney in Chart? No - copy requested        Would patient like information on creating a medical advance directive?  No - Patient declined No - Patient declined No - Patient declined No - patient declined information No - patient declined information No - patient declined information    Current Medications (verified) Outpatient  Encounter Medications as of 10/20/2023  Medication Sig   acetaminophen (TYLENOL) 650 MG CR tablet Take 1,300 mg by mouth every 8 (eight) hours as needed for pain.   cromolyn (NASALCROM) 5.2 MG/ACT nasal spray Place 1 spray into both nostrils 4 (four) times daily. (Patient taking differently: Place 1 spray into both nostrils 4 (four) times daily as needed for allergies.)   diclofenac (VOLTAREN) 75 MG EC tablet Take 1 tablet (75 mg total) by mouth 2 (two) times daily.   fluticasone (FLONASE) 50 MCG/ACT nasal spray Place 2 sprays into both nostrils daily as needed for allergies.    ibuprofen (ADVIL,MOTRIN) 200 MG tablet Take 200 mg by mouth every 8 (eight) hours as needed (pain.).   loratadine (CLARITIN) 10 MG tablet Take 10 mg by mouth daily as needed for allergies.   Multiple Vitamin (MULTIVITAMIN WITH MINERALS) TABS tablet Take 1 tablet by mouth daily.   telmisartan-hydrochlorothiazide (MICARDIS HCT) 80-25 MG tablet TAKE 1 TABLET BY MOUTH DAILY. PLEASE CALL OUR OFFICE TO SCHEDULE A FOLLOW UP   triamcinolone cream (KENALOG) 0.1 % APPLY TO AFFECTED AREA TWICE A DAY   TURMERIC CURCUMIN PO Take 500 mg by mouth in the morning.   [DISCONTINUED] sulfamethoxazole-trimethoprim (BACTRIM DS) 800-160 MG tablet Take 1 tablet by mouth 2 (two) times daily.   No facility-administered encounter medications on file as of 10/20/2023.    Allergies (verified) Penicillins   History: Past Medical History:  Diagnosis Date   Arthritis    Bronchitis  Complication of anesthesia    hard to wake up years ago   Environmental and seasonal allergies    dust mites, dust, cats,trees,   GERD (gastroesophageal reflux disease)    Headache    Heart murmur    Hypertension    Shortness of breath dyspnea    with exertion   Past Surgical History:  Procedure Laterality Date   ABDOMINAL HYSTERECTOMY     CHOLECYSTECTOMY N/A 09/27/2018   Procedure: LAPAROSCOPIC CHOLECYSTECTOMY;  Surgeon: Harriette Bouillon, MD;  Location: [facility] OR;   Service: General;  Laterality: N/A;   COLONOSCOPY WITH PROPOFOL N/A 04/13/2023   Procedure: COLONOSCOPY WITH PROPOFOL;  Surgeon: Beverley Fiedler, MD;  Location: [facility];  Service: Gastroenterology;  Laterality: N/A;   CYST REMOVAL HAND     NASAL SINUS SURGERY     ORIF ANKLE FRACTURE Right 07/30/2015   Procedure: OPEN REDUCTION INTERNAL FIXATION (ORIF) RIGHT  ANKLE ;  Surgeon: Samson Frederic, MD;  Location: [facility];  Service: Orthopedics;  Laterality: Right;   Family History  Problem Relation Age of Onset   Aneurysm Brother    Colon cancer Neg Hx    Liver disease Neg Hx    Esophageal cancer Neg Hx    Social History   Socioeconomic History   Marital status: Married    Spouse name: Not on file   Number of children: 2   Years of education: Not on file   Highest education level: Not on file  Occupational History   Occupation: Housewife     Employer: [employer]  Tobacco Use   Smoking status: Former    Current packs/day: 0.00    Average packs/day: 1 pack/day for 35.0 years (35.0 ttl pk-yrs)    Types: Cigarettes    Start date: 09/19/1978    Quit date: 09/18/2013    Years since quitting: 10.0    Passive exposure: Past   Smokeless tobacco: Never  Vaping Use   Vaping status: Never Used  Substance and Sexual Activity   Alcohol use: No   Drug use: No   Sexual activity: Not on file  Other Topics Concern   Not on file  Social History Narrative   Was in the [employer] x1 year   Social Drivers of Health   Financial Resource Strain: Low Risk  (10/20/2023)   Overall Financial Resource Strain (CARDIA)    Difficulty of Paying Living Expenses: Not hard at all  Food Insecurity: No Food Insecurity (10/20/2023)   Hunger Vital Sign    Worried About Running Out of Food in the Last Year: Never true    Ran Out of Food in the Last Year: Never true  Transportation Needs: No Transportation Needs (10/20/2023)   PRAPARE - Administrator, Civil Service (Medical): No    Lack of  Transportation (Non-Medical): No  Physical Activity: Sufficiently Active (10/20/2023)   Exercise Vital Sign    Days of Exercise per Week: 5 days    Minutes of Exercise per Session: 150+ min  Stress: No Stress Concern Present (10/20/2023)   [name] of Occupational Health - Occupational Stress Questionnaire    Feeling of Stress : Not at all  Social Connections: Socially Integrated (10/20/2023)   Social Connection and Isolation Panel [NHANES]    Frequency of Communication with Friends and Family: More than three times a week    Frequency of Social Gatherings with Friends and Family: More than three times a week    Attends Religious Services: More than 4  times per year    Active Member of Clubs or Organizations: Yes    Attends Banker Meetings: More than 4 times per year    Marital Status: Married    Tobacco Counseling Counseling given: No    Clinical Intake:  Pre-visit preparation completed: Yes  Pain : No/denies pain     BMI - recorded: 52.26 Nutritional Status: BMI > 30  Obese Nutritional Risks: None  Lab Results  Component Value Date   HGBA1C 5.9 10/20/2022   HGBA1C 5.9 10/14/2021   HGBA1C 5.9 10/11/2020     How often do you need to have someone help you when you read instructions, pamphlets, or other written materials from your doctor or pharmacy?: 1 - Never  Interpreter Needed?: No  Information entered by :: Hassell Halim, CMA   Activities of Daily Living     10/20/2023    1:16 PM  In your present state of health, do you have any difficulty performing the following activities:  Hearing? 0  Vision? 0  Difficulty concentrating or making decisions? 0  Walking or climbing stairs? 0  Dressing or bathing? 0  Doing errands, shopping? 0  Preparing Food and eating ? N  Using the Toilet? N  In the past six months, have you accidently leaked urine? N  Do you have problems with loss of bowel control? N  Managing your Medications? N  Managing your  Finances? N  Housekeeping or managing your Housekeeping? N    Patient Care Team: Myrlene Broker, MD as PCP - General (Internal Medicine)  Indicate any recent Medical Services you may have received from other than [facility] providers in the past year (date may be approximate).     Assessment:   This is a routine wellness examination for [place].  Hearing/Vision screen Hearing Screening - Comments:: Denies hearing difficulties   Vision Screening - Comments:: Wears rx glasses - up to date with routine eye exams with [facility]   Goals Addressed               This Visit's Progress     Weight (lb) < 200 lb (90.7 kg) (pt-stated)   295 lb (133.8 kg)     Patient stated she's wanting to lose about 110lbs.       Depression Screen     10/20/2023    1:21 PM 09/02/2023   10:47 AM 11/24/2022   10:31 AM 11/24/2022   10:30 AM 10/16/2022    1:07 PM 10/14/2021    1:02 PM 10/11/2020   12:54 PM  PHQ 2/9 Scores  PHQ - 2 Score 0 0 0 0 0 0 0  PHQ- 9 Score 1  0 0 0      Fall Risk     10/20/2023    1:16 PM 05/06/2023   10:27 AM 11/24/2022   10:30 AM 10/20/2022    1:03 PM 10/16/2022    1:06 PM  Fall Risk   Falls in the past year? 1 1 0 0 0  Number falls in past yr: 0 0 0 0 0  Injury with Fall? 1 1 0 0 0  Comment fractured left knee      Risk for fall due to :   No Fall Risks No Fall Risks No Fall Risks  Follow up Falls evaluation completed;Falls prevention discussed Falls evaluation completed   Falls prevention discussed    MEDICARE RISK AT HOME:  Medicare Risk at Home Any stairs in or around the home?:  No (outside) If so, are there any without handrails?: No Home free of loose throw rugs in walkways, pet beds, electrical cords, etc?: Yes Adequate lighting in your home to reduce risk of falls?: Yes Life alert?: No Use of a cane, walker or w/c?: No Grab bars in the bathroom?: No Shower chair or bench in shower?: Yes Elevated toilet seat or a handicapped toilet?: Yes  TIMED UP AND  GO:  Was the test performed?  No  Cognitive Function: 6CIT completed        10/20/2023    1:20 PM 10/16/2022    1:08 PM  6CIT Screen  What Year? 0 points 0 points  What month? 0 points 0 points  What time? 0 points 0 points  Count back from 20 0 points 0 points  Months in reverse 0 points 0 points  Repeat phrase 0 points 0 points  Total Score 0 points 0 points    Immunizations Immunization History  Administered Date(s) Administered   Influenza Inj Mdck Quad Pf 03/25/2020   Influenza,inj,Quad PF,6+ Mos 07/01/2018, 05/08/2019, 04/26/2021   Influenza-Unspecified 04/25/2023   PFIZER(Purple Top)SARS-COV-2 Vaccination 01/31/2020, 03/20/2020, 07/06/2020   Pfizer Covid-19 Vaccine Bivalent Booster 69yrs & up 04/26/2021   Tdap 07/21/2012   Unspecified SARS-COV-2 Vaccination 04/25/2023   Zoster Recombinant(Shingrix) 10/19/2017, 02/22/2018    Screening Tests Health Maintenance  Topic Date Due   DTaP/Tdap/Td (2 - Td or Tdap) 07/21/2022   INFLUENZA VACCINE  02/19/2024   Lung Cancer Screening  03/12/2024   Medicare Annual Wellness (AWV)  10/19/2024   MAMMOGRAM  07/28/2025   Colonoscopy  04/12/2033   COVID-19 Vaccine  Completed   Hepatitis C Screening  Completed   HIV Screening  Completed   Zoster Vaccines- Shingrix  Completed   HPV VACCINES  Aged Out    Health Maintenance  Health Maintenance Due  Topic Date Due   DTaP/Tdap/Td (2 - Td or Tdap) 07/21/2022   Health Maintenance Items Addressed:10/20/2023  Additional Screening:  Vision Screening: Recommended annual ophthalmology exams for early detection of glaucoma and other disorders of the eye. Pt stated that she's being followed for routine eye exams with [facility].  Dental Screening: Recommended annual dental exams for proper oral hygiene  Community Resource Referral / Chronic Care Management: CRR required this visit?  No   CCM required this visit?  No     Plan:     I have personally reviewed and noted the  following in the patient's chart:   Medical and social history Use of alcohol, tobacco or illicit drugs  Current medications and supplements including opioid prescriptions. Patient is not currently taking opioid prescriptions. Functional ability and status Nutritional status Physical activity Advanced directives List of other physicians Hospitalizations, surgeries, and ER visits in previous 12 months Vitals Screenings to include cognitive, depression, and falls Referrals and appointments  In addition, I have reviewed and discussed with patient certain preventive protocols, quality metrics, and best practice recommendations. A written personalized care plan for preventive services as well as general preventive health recommendations were provided to patient.     Darreld Mclean, CMA   10/21/2023   After Visit Summary: (MyChart) Due to this being a telephonic visit, the after visit summary with patients personalized plan was offered to patient via MyChart   Notes: Nothing significant to report at this time.

## 2023-10-21 NOTE — Addendum Note (Signed)
 Addended by: Darreld Mclean on: 10/21/2023 09:18 AM   Modules accepted: Orders

## 2023-11-03 ENCOUNTER — Encounter: Payer: Self-pay | Admitting: Internal Medicine

## 2023-11-03 ENCOUNTER — Ambulatory Visit (INDEPENDENT_AMBULATORY_CARE_PROVIDER_SITE_OTHER): Payer: Medicare (Managed Care) | Admitting: Internal Medicine

## 2023-11-03 VITALS — BP 142/80 | HR 114 | Temp 98.1°F | Ht 63.0 in | Wt 294.0 lb

## 2023-11-03 DIAGNOSIS — G4733 Obstructive sleep apnea (adult) (pediatric): Secondary | ICD-10-CM | POA: Diagnosis not present

## 2023-11-03 DIAGNOSIS — R7303 Prediabetes: Secondary | ICD-10-CM | POA: Diagnosis not present

## 2023-11-03 DIAGNOSIS — Z87891 Personal history of nicotine dependence: Secondary | ICD-10-CM

## 2023-11-03 DIAGNOSIS — Z0000 Encounter for general adult medical examination without abnormal findings: Secondary | ICD-10-CM

## 2023-11-03 DIAGNOSIS — I10 Essential (primary) hypertension: Secondary | ICD-10-CM

## 2023-11-03 LAB — CBC
HCT: 38 % (ref 36.0–46.0)
Hemoglobin: 12.4 g/dL (ref 12.0–15.0)
MCHC: 32.5 g/dL (ref 30.0–36.0)
MCV: 86.8 fl (ref 78.0–100.0)
Platelets: 255 10*3/uL (ref 150.0–400.0)
RBC: 4.38 Mil/uL (ref 3.87–5.11)
RDW: 13.5 % (ref 11.5–15.5)
WBC: 7 10*3/uL (ref 4.0–10.5)

## 2023-11-03 LAB — COMPREHENSIVE METABOLIC PANEL WITH GFR
ALT: 9 U/L (ref 0–35)
AST: 14 U/L (ref 0–37)
Albumin: 4.1 g/dL (ref 3.5–5.2)
Alkaline Phosphatase: 137 U/L — ABNORMAL HIGH (ref 39–117)
BUN: 16 mg/dL (ref 6–23)
CO2: 31 meq/L (ref 19–32)
Calcium: 9.5 mg/dL (ref 8.4–10.5)
Chloride: 102 meq/L (ref 96–112)
Creatinine, Ser: 0.84 mg/dL (ref 0.40–1.20)
GFR: 73.83 mL/min (ref >60.00)
Glucose, Bld: 109 mg/dL — ABNORMAL HIGH (ref 70–99)
Potassium: 3.5 meq/L (ref 3.5–5.1)
Sodium: 140 meq/L (ref 135–145)
Total Bilirubin: 0.4 mg/dL (ref 0.2–1.2)
Total Protein: 7.8 g/dL (ref 6.0–8.3)

## 2023-11-03 LAB — LIPID PANEL
Cholesterol: 120 mg/dL (ref 0–200)
HDL: 37.1 mg/dL — ABNORMAL LOW (ref >39.00)
LDL Cholesterol: 56 mg/dL (ref 0–99)
NonHDL: 82.63
Total CHOL/HDL Ratio: 3
Triglycerides: 135 mg/dL (ref 0.0–149.0)
VLDL: 27 mg/dL (ref 0.0–40.0)

## 2023-11-03 LAB — HEMOGLOBIN A1C: Hgb A1c MFr Bld: 5.7 % (ref 4.6–6.5)

## 2023-11-03 MED ORDER — TELMISARTAN-HCTZ 80-25 MG PO TABS: 1.0000 | ORAL_TABLET | Freq: Every day | ORAL | 3 refills | Status: DC

## 2023-11-03 MED ORDER — TRIAMCINOLONE ACETONIDE 0.1 % EX CREA: TOPICAL_CREAM | CUTANEOUS | 3 refills | Status: AC

## 2023-11-03 NOTE — Patient Instructions (Signed)
 You can think about if you want to try the weight loss shot wegovy let us  know.

## 2023-11-03 NOTE — Assessment & Plan Note (Signed)
 Flu shot yearly. Pneumonia complete. Shingrix complete. Tetanus due at pharmacy. Colonoscopy up to date. Mammogram up to date, pap smear up to date. Counseled about sun safety and mole surveillance. Counseled about the dangers of distracted driving. Given 10 year screening recommendations.

## 2023-11-03 NOTE — Assessment & Plan Note (Signed)
 Counseled about glp-1 coverage given that she has hypertension and OSA. She will consider this and let us  know if she wants to try wegovy.

## 2023-11-03 NOTE — Assessment & Plan Note (Signed)
 Checking HGA1c and adjust as needed.

## 2023-11-03 NOTE — Progress Notes (Signed)
   Subjective:   Patient ID: [name], female    DOB: [birth year], 63 y.o.   MRN: [id number]  HPI The patient is a 63 YO female coming in for physical.   PMH, FMH, social history reviewed and updated  Review of Systems  Constitutional: Negative.   HENT: Negative.    Eyes: Negative.   Respiratory:  Negative for cough, chest tightness and shortness of breath.   Cardiovascular:  Negative for chest pain, palpitations and leg swelling.  Gastrointestinal:  Negative for abdominal distention, abdominal pain, constipation, diarrhea, nausea and vomiting.  Musculoskeletal:  Positive for arthralgias.  Skin: Negative.   Neurological: Negative.   Psychiatric/Behavioral: Negative.      Objective:  Physical Exam Constitutional:      Appearance: She is well-developed. She is obese.  HENT:     Head: Normocephalic and atraumatic.  Cardiovascular:     Rate and Rhythm: Normal rate and regular rhythm.  Pulmonary:     Effort: Pulmonary effort is normal. No respiratory distress.     Breath sounds: Normal breath sounds. No wheezing or rales.  Abdominal:     General: Bowel sounds are normal. There is no distension.     Palpations: Abdomen is soft.     Tenderness: There is no abdominal tenderness. There is no rebound.  Musculoskeletal:     Cervical back: Normal range of motion.  Skin:    General: Skin is warm and dry.  Neurological:     Mental Status: She is alert and oriented to person, place, and time.     Coordination: Coordination normal.     Vitals:   11/03/23 1443 11/03/23 1446 11/03/23 1506  BP: (!) 160/80 (!) 160/80 (!) 142/80  Pulse: (!) 114    Temp: 98.1 F (36.7 C)    TempSrc: Oral    SpO2: 97%    Weight: 294 lb (133.4 kg)    Height: 5\' 3"  (1.6 m)      Assessment & Plan:

## 2023-11-03 NOTE — Assessment & Plan Note (Signed)
 Getting lung cancer screening and would qualify until 15 years quit.

## 2023-11-03 NOTE — Assessment & Plan Note (Signed)
 Using CPAP and has mild OSA on recent study but labile BP so should use CPAP.

## 2023-11-03 NOTE — Assessment & Plan Note (Signed)
 BP elevated today but typically good at home. Taking telmisartan/hydrochlorothiazide 80/25. Suspect some deconditioning with fracture last year contributes to her labile BP with movement. She just started getting back to gym last week. Recheck in 3 months. Checking lipid panel and CMP today.

## 2023-11-11 ENCOUNTER — Encounter: Payer: Self-pay | Admitting: Internal Medicine

## 2024-01-05 DIAGNOSIS — G43009 Migraine without aura, not intractable, without status migrainosus: Secondary | ICD-10-CM | POA: Diagnosis not present

## 2024-01-05 DIAGNOSIS — G9089 Other disorders of autonomic nervous system: Secondary | ICD-10-CM | POA: Diagnosis not present

## 2024-01-05 DIAGNOSIS — Z1379 Encounter for other screening for genetic and chromosomal anomalies: Secondary | ICD-10-CM | POA: Diagnosis not present

## 2024-01-05 DIAGNOSIS — Z820 Family history of epilepsy and other diseases of the nervous system: Secondary | ICD-10-CM | POA: Diagnosis not present

## 2024-01-05 DIAGNOSIS — Z1371 Encounter for nonprocreative screening for genetic disease carrier status: Secondary | ICD-10-CM | POA: Diagnosis not present

## 2024-02-01 ENCOUNTER — Ambulatory Visit: Payer: Self-pay

## 2024-02-01 NOTE — Telephone Encounter (Signed)
 FYI Only or Action Required?: FYI only for provider.  Patient was last seen in primary care on 11/03/2023 by Rollene Almarie LABOR, MD.  Called Nurse Triage reporting Gout.  Symptoms began a week ago.  Interventions attempted: OTC medications: Aleve .  Symptoms are: unchanged. Pt's great tow and adjoining 2 toes of left foot feel warm, are painful and slightly swollen  Triage Disposition: See PCP When Office is Open (Within 3 Days)  Patient/caregiver understands and will follow disposition?: Yes                Copied from CRM [id number]. Topic: Clinical - Red Word Triage >> Feb 01, 2024  4:49 PM Shereese L wrote: [facility] that prompted transfer to Nurse Triage: patient stated that she has gout Reason for Disposition  [1] MODERATE pain (e.g., interferes with normal activities, limping) AND [2] present > 3 days  Answer Assessment - Initial Assessment Questions 1. ONSET: When did the pain start?      weds 2. LOCATION: Where is the pain located?   (e.g., around nail, entire toe, at foot joint)      Left foot great toe and the 2 next to it. 3. PAIN: How bad is the pain?    (Scale 1-10; or mild, moderate, severe)     10/10 4. APPEARANCE: What does the toe look like? (e.g., redness, swelling, bruising, pallor)     Swollen - sore to the touch, painful to move them 5. CAUSE: What do you think is causing the toe pain?     Gout or bursitis 6. OTHER SYMPTOMS: Do you have any other symptoms? (e.g., leg pain, rash, fever, numbness)     no  Protocols used: Toe Pain-A-AH

## 2024-02-02 ENCOUNTER — Ambulatory Visit (INDEPENDENT_AMBULATORY_CARE_PROVIDER_SITE_OTHER): Payer: Medicare (Managed Care) | Admitting: Internal Medicine

## 2024-02-02 ENCOUNTER — Encounter: Payer: Self-pay | Admitting: Internal Medicine

## 2024-02-02 VITALS — BP 132/88 | HR 76 | Temp 97.6°F | Ht 63.0 in | Wt 294.0 lb

## 2024-02-02 DIAGNOSIS — M79672 Pain in left foot: Secondary | ICD-10-CM

## 2024-02-02 MED ORDER — PREDNISONE 20 MG PO TABS: 40.0000 mg | ORAL_TABLET | Freq: Every day | ORAL | 0 refills | Status: AC

## 2024-02-02 NOTE — Assessment & Plan Note (Signed)
 Could be gout or pseudogout. Rx prednisone  and continue diclofenac  for pain. Check uric acid with next set of labs. She is on hydrochlorothiazide  which is a risk for high uric acid.

## 2024-02-02 NOTE — Progress Notes (Signed)
   Subjective:   Patient ID: [name], female    DOB: 1961-01-18, 63 y.o.   MRN: [id number]  Toe Pain    The patient is a 63 YO female coming in for left foot pain. Started in midfoot then localized to the 1st 3 toes. Some swelling and severe pain. Tried aleve  without much relief diclofenac  helped more. Improving some.   Review of Systems  Constitutional: Negative.   HENT: Negative.    Eyes: Negative.   Respiratory:  Negative for cough, chest tightness and shortness of breath.   Cardiovascular:  Negative for chest pain, palpitations and leg swelling.  Gastrointestinal:  Negative for abdominal distention, abdominal pain, constipation, diarrhea, nausea and vomiting.  Musculoskeletal:  Positive for arthralgias, joint swelling and myalgias.  Skin: Negative.   Neurological: Negative.   Psychiatric/Behavioral: Negative.      Objective:  Physical Exam Constitutional:      Appearance: She is well-developed. She is obese.  HENT:     Head: Normocephalic and atraumatic.  Cardiovascular:     Rate and Rhythm: Normal rate and regular rhythm.  Pulmonary:     Effort: Pulmonary effort is normal. No respiratory distress.     Breath sounds: Normal breath sounds. No wheezing or rales.  Abdominal:     General: Bowel sounds are normal. There is no distension.     Palpations: Abdomen is soft.     Tenderness: There is no abdominal tenderness. There is no rebound.  Musculoskeletal:        General: Swelling and tenderness present.     Cervical back: Normal range of motion.     Comments: Left foot with swelling in the 1st 3 toes and pain to palpation.  Skin:    General: Skin is warm and dry.  Neurological:     Mental Status: She is alert and oriented to person, place, and time.     Coordination: Coordination normal.     Vitals:   02/02/24 0854  BP: 132/88  Pulse: 76  Temp: 97.6 F (36.4 C)  TempSrc: Oral  SpO2: 98%  Weight: 294 lb (133.4 kg)  Height: 5' 3 (1.6 m)    Assessment  & Plan:

## 2024-02-02 NOTE — Patient Instructions (Signed)
 We have sent in prednisone  to take 2 pills daily for 3 days to clear this. It is okay to keep taking diclofenac  for the pain that will work the best.

## 2024-02-08 ENCOUNTER — Telehealth: Payer: Self-pay

## 2024-02-08 NOTE — Telephone Encounter (Unsigned)
 Copied from CRM [id number]. Topic: Clinical - Medical Advice >> Feb 08, 2024  3:40 PM Viola F wrote: Reason for CRM: patient was seen 02/02/24 for foot pain - she did have a medication prescribed but she wants to know if it's gout or if she has to be tested for gout? Please call her at rollator

## 2024-02-08 NOTE — Telephone Encounter (Unsigned)
 Copied from CRM [id number]. Topic: Clinical - Order For Equipment >> Feb 08, 2024  3:37 PM Viola F wrote: Reason for CRM: Patient would like a script/order for arollator walker with the seat in it - Please call her at [phone] (M)

## 2024-02-10 NOTE — Telephone Encounter (Signed)
**Note De-identified  [place] Obfuscation** Please advise 

## 2024-02-11 ENCOUNTER — Other Ambulatory Visit: Payer: Self-pay | Admitting: Family

## 2024-02-11 MED ORDER — ROLLATOR ULTRA-LIGHT MISC: 1.0000 | Freq: Once | 0 refills | Status: AC | PRN

## 2024-03-16 DIAGNOSIS — H5203 Hypermetropia, bilateral: Secondary | ICD-10-CM | POA: Diagnosis not present

## 2024-03-16 DIAGNOSIS — H04123 Dry eye syndrome of bilateral lacrimal glands: Secondary | ICD-10-CM | POA: Diagnosis not present

## 2024-03-16 DIAGNOSIS — Z135 Encounter for screening for eye and ear disorders: Secondary | ICD-10-CM | POA: Diagnosis not present

## 2024-03-16 DIAGNOSIS — H524 Presbyopia: Secondary | ICD-10-CM | POA: Diagnosis not present

## 2024-03-16 DIAGNOSIS — H259 Unspecified age-related cataract: Secondary | ICD-10-CM | POA: Diagnosis not present

## 2024-03-16 DIAGNOSIS — H52223 Regular astigmatism, bilateral: Secondary | ICD-10-CM | POA: Diagnosis not present

## 2024-03-17 ENCOUNTER — Other Ambulatory Visit: Payer: Self-pay | Admitting: Acute Care

## 2024-03-17 DIAGNOSIS — Z122 Encounter for screening for malignant neoplasm of respiratory organs: Secondary | ICD-10-CM

## 2024-03-17 DIAGNOSIS — Z87891 Personal history of nicotine dependence: Secondary | ICD-10-CM

## 2024-03-18 ENCOUNTER — Ambulatory Visit: Payer: Medicare (Managed Care) | Admitting: Internal Medicine

## 2024-03-18 ENCOUNTER — Encounter: Payer: Self-pay | Admitting: Internal Medicine

## 2024-03-18 VITALS — BP 130/90 | HR 89 | Temp 98.9°F | Ht 63.0 in | Wt 295.2 lb

## 2024-03-18 DIAGNOSIS — R051 Acute cough: Secondary | ICD-10-CM

## 2024-03-18 DIAGNOSIS — R7303 Prediabetes: Secondary | ICD-10-CM

## 2024-03-18 DIAGNOSIS — I10 Essential (primary) hypertension: Secondary | ICD-10-CM | POA: Diagnosis not present

## 2024-03-18 DIAGNOSIS — R059 Cough, unspecified: Secondary | ICD-10-CM | POA: Insufficient documentation

## 2024-03-18 DIAGNOSIS — R062 Wheezing: Secondary | ICD-10-CM

## 2024-03-18 MED ORDER — DOXYCYCLINE HYCLATE 100 MG PO TABS: 100.0000 mg | ORAL_TABLET | Freq: Two times a day (BID) | ORAL | 0 refills | Status: DC

## 2024-03-18 MED ORDER — ALBUTEROL SULFATE HFA 108 (90 BASE) MCG/ACT IN AERS: 2.0000 | INHALATION_SPRAY | Freq: Four times a day (QID) | RESPIRATORY_TRACT | 1 refills | Status: AC | PRN

## 2024-03-18 MED ORDER — PREDNISONE 10 MG PO TABS: ORAL_TABLET | ORAL | 0 refills | Status: DC

## 2024-03-18 MED ORDER — HYDROCODONE BIT-HOMATROP MBR 5-1.5 MG/5ML PO SOLN: 5.0000 mL | Freq: Four times a day (QID) | ORAL | 0 refills | Status: AC | PRN

## 2024-03-18 NOTE — Assessment & Plan Note (Signed)
 Mild to mod, for prednisone taper, inhaler prn,  to f/u any worsening symptoms or concerns

## 2024-03-18 NOTE — Assessment & Plan Note (Signed)
 BP Readings from Last 3 Encounters:  03/18/24 (!) 130/90  02/02/24 132/88  11/03/23 (!) 142/80   Stable, pt to continue medical treatment micardis  hct 80 25 qd

## 2024-03-18 NOTE — Progress Notes (Signed)
 Patient ID: [name], female   DOB: 04/21/1961, 63 y.o.   MRN: [id number]        Chief Complaint: follow up cough, wheezing, htn, hyperglycemia       HPI:  [name] is a 63 y.o. female Here with acute onset mild to mod 3 days ST, HA, general weakness and malaise, with prod cough greenish sputum, but Pt denies chest pain, increased sob or doe, wheezing, orthopnea, PND, increased LE swelling, palpitations, dizziness or syncope, except tor mild wheezing sob since yesterday.   Pt denies polydipsia, polyuria, or new focal neuro s/s.          Wt Readings from Last 3 Encounters:  03/18/24 295 lb 3.2 oz (133.9 kg)  02/02/24 294 lb (133.4 kg)  11/03/23 294 lb (133.4 kg)   BP Readings from Last 3 Encounters:  03/18/24 (!) 130/90  02/02/24 132/88  11/03/23 (!) 142/80         Past Medical History:  Diagnosis Date   Arthritis    Bronchitis    Complication of anesthesia    hard to wake up years ago   Environmental and seasonal allergies    dust mites, dust, cats,trees,   GERD (gastroesophageal reflux disease)    Headache    Heart murmur    Hypertension    Shortness of breath dyspnea    with exertion   Past Surgical History:  Procedure Laterality Date   ABDOMINAL HYSTERECTOMY     CHOLECYSTECTOMY N/A 09/27/2018   Procedure: LAPAROSCOPIC CHOLECYSTECTOMY;  Surgeon: Vanderbilt Ned, MD;  Location: [facility] OR;  Service: General;  Laterality: N/A;   COLONOSCOPY WITH PROPOFOL  N/A 04/13/2023   Procedure: COLONOSCOPY WITH PROPOFOL ;  Surgeon: Albertus Gordy HERO, MD;  Location: [facility];  Service: Gastroenterology;  Laterality: N/A;   CYST REMOVAL HAND     NASAL SINUS SURGERY     ORIF ANKLE FRACTURE Right 07/30/2015   Procedure: OPEN REDUCTION INTERNAL FIXATION (ORIF) RIGHT  ANKLE ;  Surgeon: Redell Shoals, MD;  Location: [facility];  Service: Orthopedics;  Laterality: Right;    reports that she quit smoking about 10 years ago. Her smoking use included cigarettes. She started smoking about 45 years  ago. She has a 35 pack-year smoking history. She has been exposed to tobacco smoke. She has never used smokeless tobacco. She reports that she does not drink alcohol  and does not use drugs. family history includes Aneurysm in her brother. Allergies  Allergen Reactions   Penicillins Hives    Has patient had a PCN reaction causing immediate rash, facial/tongue/throat swelling, SOB or lightheadedness with hypotension: Yes Has patient had a PCN reaction causing severe rash involving mucus membranes or skin necrosis: Yes Has patient had a PCN reaction that required hospitalization: Yes Has patient had a PCN reaction occurring within the last 10 years: Yes If all of the above answers are NO, then may proceed with Cephalosporin use.    Current Outpatient Medications on File Prior to Visit  Medication Sig Dispense Refill   acetaminophen  (TYLENOL ) 650 MG CR tablet Take 1,300 mg by mouth every 8 (eight) hours as needed for pain.     cromolyn  (NASALCROM ) 5.2 MG/ACT nasal spray Place 1 spray into both nostrils 4 (four) times daily. (Patient taking differently: Place 1 spray into both nostrils 4 (four) times daily as needed for allergies.) 26 mL 12   diclofenac  (VOLTAREN ) 75 MG EC tablet Take 1 tablet (75 mg total) by mouth 2 (two) times daily. 180 tablet  2   fluticasone  (FLONASE ) 50 MCG/ACT nasal spray Place 2 sprays into both nostrils daily as needed for allergies.      ibuprofen  (ADVIL ,MOTRIN ) 200 MG tablet Take 200 mg by mouth every 8 (eight) hours as needed (pain.).     loratadine (CLARITIN) 10 MG tablet Take 10 mg by mouth daily as needed for allergies.     Misc. Devices (ROLLATOR ULTRA-LIGHT) MISC 1 Device by Does not apply route once as needed for up to 1 dose. Rollator Walker with seat 1 each 0   Multiple Vitamin (MULTIVITAMIN WITH MINERALS) TABS tablet Take 1 tablet by mouth daily.     telmisartan -hydrochlorothiazide  (MICARDIS  HCT) 80-25 MG tablet Take 1 tablet by mouth daily. 90 tablet 3    triamcinolone  cream (KENALOG ) 0.1 % APPLY TO AFFECTED AREA TWICE A DAY 100 g 3   TURMERIC CURCUMIN PO Take 500 mg by mouth in the morning.     No current facility-administered medications on file prior to visit.        ROS:  All others reviewed and negative.  Objective        PE:  BP (!) 130/90   Pulse 89   Temp 98.9 F (37.2 C)   Ht 5' 3 (1.6 m)   Wt 295 lb 3.2 oz (133.9 kg)   SpO2 98%   BMI 52.29 kg/m                 Constitutional: Pt appears mild ill               HENT: Head: NCAT.                Right Ear: External ear normal.                 Left Ear: External ear normal. Bilat tm's with mild erythema.  Max sinus areas non tender.  Pharynx with mild erythema, no exudate               Eyes: . Pupils are equal, round, and reactive to light. Conjunctivae and EOM are normal               Nose: without d/c or deformity               Neck: Neck supple. Gross normal ROM               Cardiovascular: Normal rate and regular rhythm.                 Pulmonary/Chest: Effort normal and breath sounds without rales but with few bilateral wheezing.                Neurological: Pt is alert. At baseline orientation, motor grossly intact               Skin: Skin is warm. No rashes, no other new lesions, LE edema - none               Psychiatric: Pt behavior is normal without agitation   Micro: none  Cardiac tracings I have personally interpreted today:  none  Pertinent Radiological findings (summarize): none   Lab Results  Component Value Date   WBC 7.0 11/03/2023   HGB 12.4 11/03/2023   HCT 38.0 11/03/2023   PLT 255.0 11/03/2023   GLUCOSE 109 (H) 11/03/2023   CHOL 120 11/03/2023   TRIG 135.0 11/03/2023   HDL 37.10 (L) 11/03/2023   LDLCALC 56 11/03/2023   ALT  9 11/03/2023   AST 14 11/03/2023   NA 140 11/03/2023   K 3.5 11/03/2023   CL 102 11/03/2023   CREATININE 0.84 11/03/2023   BUN 16 11/03/2023   CO2 31 11/03/2023   TSH 1.65 07/27/2017   HGBA1C 5.7 11/03/2023    Assessment/Plan:  [name] is a 63 y.o. Black or African American [2] female with  has a past medical history of Arthritis, Bronchitis, Complication of anesthesia, Environmental and seasonal allergies, GERD (gastroesophageal reflux disease), Headache, Heart murmur, Hypertension, and Shortness of breath dyspnea.  Cough Mild to mod, c/w bronchitis vs pna, declines cxr, for antibx course doxycycline  100 bid course, cough med prn  to f/u any worsening symptoms or concerns  Wheezing Mild to mod, for prednisone  taper, inhaler prn,  to f/u any worsening symptoms or concerns  Essential hypertension, benign BP Readings from Last 3 Encounters:  03/18/24 (!) 130/90  02/02/24 132/88  11/03/23 (!) 142/80   Stable, pt to continue medical treatment micardis  hct 80 25 qd   Pre-diabetes Lab Results  Component Value Date   HGBA1C 5.7 11/03/2023   Stable, pt to continue current medical treatment  - diet, wt control  Followup: Return if symptoms worsen or fail to improve.  Lynwood Rush, MD 03/18/2024 12:44 PM [facility] - [facility]

## 2024-03-18 NOTE — Patient Instructions (Signed)
 Please take all new medication as prescribed - the antibiotic, cough medicine as needed, prednisone , and inhaler as needed  Please continue all other medications as before, and refills have been done if requested.  Please have the pharmacy call with any other refills you may need.  Please keep your appointments with your specialists as you may have planned

## 2024-03-18 NOTE — Assessment & Plan Note (Signed)
 Lab Results  Component Value Date   HGBA1C 5.7 11/03/2023   Stable, pt to continue current medical treatment  - diet, wt control

## 2024-03-18 NOTE — Assessment & Plan Note (Signed)
 Mild to mod, c/w bronchitis vs pna, declines cxr, for antibx course doxycycline  100 bid course, cough med prn  to f/u any worsening symptoms or concerns

## 2024-04-04 ENCOUNTER — Ambulatory Visit
Admission: RE | Admit: 2024-04-04 | Discharge: 2024-04-04 | Disposition: A | Discharge status: Home / Self Care | Payer: Medicare (Managed Care) | Source: Ambulatory Visit | Attending: Acute Care | Admitting: Acute Care

## 2024-04-04 DIAGNOSIS — Z87891 Personal history of nicotine dependence: Secondary | ICD-10-CM

## 2024-04-04 DIAGNOSIS — Z122 Encounter for screening for malignant neoplasm of respiratory organs: Secondary | ICD-10-CM

## 2024-04-12 ENCOUNTER — Other Ambulatory Visit: Payer: Self-pay

## 2024-04-12 DIAGNOSIS — Z122 Encounter for screening for malignant neoplasm of respiratory organs: Secondary | ICD-10-CM

## 2024-04-12 DIAGNOSIS — Z87891 Personal history of nicotine dependence: Secondary | ICD-10-CM

## 2024-06-16 ENCOUNTER — Other Ambulatory Visit: Payer: Self-pay | Admitting: Internal Medicine

## 2024-07-11 ENCOUNTER — Other Ambulatory Visit: Payer: Self-pay | Admitting: Internal Medicine

## 2024-07-11 DIAGNOSIS — Z1231 Encounter for screening mammogram for malignant neoplasm of breast: Secondary | ICD-10-CM

## 2024-07-22 ENCOUNTER — Ambulatory Visit: Payer: Self-pay

## 2024-07-22 NOTE — Telephone Encounter (Signed)
 FYI Only or Action Required?: FYI only for provider: appointment scheduled on 07/25/24.  Patient was last seen in primary care on 03/18/2024 by Norleen Lynwood ORN, MD.  Called Nurse Triage reporting Ankle Pain.  Symptoms began 3 days ago.  Interventions attempted: OTC medications: arthritis.  Symptoms are: gradually worsening.  Triage Disposition: See PCP When Office is Open (Within 3 Days)  Patient/caregiver understands and will follow disposition?: Yes   Copied from CRM #[id number]. Topic: Clinical - Red Word Triage >> Jul 22, 2024  1:18 PM Jayma L wrote: Red Word that prompted transfer to Nurse Triage: Pain in left foot , there's swelling and hard to walk on it. The pain is getting worse Reason for Disposition  [1] MODERATE pain (e.g., interferes with normal activities, limping) AND [2] present > 3 days  Answer Assessment - Initial Assessment Questions 1. ONSET: When did the pain start?      3 days 2. LOCATION: Where is the pain located?      Rt ankle 3. PAIN: How bad is the pain?  (Scale 1-10; or mild, moderate, severe)     Moderate, walks with limp 4. WORK OR EXERCISE: Has there been any recent work or exercise that involved this part of the body?      no 5. CAUSE: What do you think is causing the ankle pain?     unknown 6. OTHER SYMPTOMS: Do you have any other symptoms? (e.g., calf pain, rash, fever, swelling)     Mild swelling  Protocols used: Ankle Pain-A-AH

## 2024-07-25 ENCOUNTER — Ambulatory Visit: Payer: Medicare (Managed Care) | Admitting: Family Medicine

## 2024-07-25 ENCOUNTER — Encounter: Payer: Self-pay | Admitting: Family Medicine

## 2024-07-25 VITALS — BP 138/84 | HR 72 | Temp 97.8°F | Resp 18 | Ht 63.0 in | Wt 292.0 lb

## 2024-07-25 DIAGNOSIS — M79671 Pain in right foot: Secondary | ICD-10-CM | POA: Diagnosis not present

## 2024-07-25 LAB — URIC ACID: Uric Acid, Serum: 8.6 mg/dL — ABNORMAL HIGH (ref 2.4–7.0)

## 2024-07-25 MED ORDER — MELOXICAM 7.5 MG PO TABS: 7.5000 mg | ORAL_TABLET | Freq: Every day | ORAL | 0 refills | Status: AC

## 2024-07-25 NOTE — Telephone Encounter (Signed)
 Noted visit today

## 2024-07-25 NOTE — Progress Notes (Signed)
 "  Assessment & Plan Right foot pain Right foot pain and swelling, rule out gout Right foot pain and swelling for one week, exacerbated by weight-bearing activities. Differential diagnosis includes gout, given family history and symptoms. No current vascular issues suspected. - Prescribed meloxicam  1-2 tablets daily for pain and inflammation. - Advised against concurrent use of NSAIDs such as Aleve , ibuprofen , or Advil . - Recommended Tylenol  for additional pain relief if needed. - Ordered uric acid level to rule out gout.  Orders:   Uric acid; Future   meloxicam  (MOBIC ) 7.5 MG tablet; Take 1-2 tablets (7.5-15 mg total) by mouth daily.    Follow up plan: Return if symptoms worsen or fail to improve.  Niki Rung, MSN, APRN, FNP-C  Subjective:  HPI: [name] is a 64 y.o. female presenting on 07/25/2024 for Foot Swelling (Right foot (top of foot ) edema and cramping at times - stent was placed there couple years ago. Faith / on (she thinks it could be nerve damage) )  Discussed the use of AI scribe software for clinical note transcription with the patient, who gave verbal consent to proceed.  Patient is accompanied by her husband, who she is okay with being present.  She has experienced swelling in her right foot for about a week. The swelling does not improve with elevation and worsens when she stands after resting. Pain occurs with use of the foot, particularly on the top and side, radiating to the bottom. She describes the foot as 'puffy.'  She has a history of multiple ankle fractures, with the last significant injury three years ago when a case of sodas fell on her foot, resulting in a broken ankle. She uses TED hose to manage the swelling, which helps but increases pain due to pressure. She wears them during the day and removes them at night. There have been no recent injuries to the foot.  Family history is significant for gout, affecting her father, brother, and sister. She  has been told she might have gout, but she does not consume alcohol  and has not recently consumed foods high in purines, such as red meat or shellfish, except for holiday meals.  No recent dietary changes or trauma to the foot.       ROS: Negative unless specifically indicated above in HPI.   Relevant past medical history reviewed and updated as indicated.   Allergies and medications reviewed and updated.  Current Medications[1]  Allergies[2]  Objective:   BP 138/84   Pulse 72   Temp 97.8 F (36.6 C)   Resp 18   Ht 5' 3 (1.6 m)   Wt 292 lb (132.5 kg)   SpO2 98%   BMI 51.73 kg/m    Physical Exam Vitals reviewed.  Constitutional:      General: She is not in acute distress.    Appearance: Normal appearance. She is not ill-appearing, toxic-appearing or diaphoretic.  HENT:     Head: Normocephalic and atraumatic.  Eyes:     General: No scleral icterus.       Right eye: No discharge.        Left eye: No discharge.     Conjunctiva/sclera: Conjunctivae normal.  Cardiovascular:     Rate and Rhythm: Normal rate.     Pulses:          Dorsalis pedis pulses are 1+ on the right side and 2+ on the left side.  Pulmonary:     Effort: Pulmonary effort is normal. No respiratory  distress.  Musculoskeletal:        General: Normal range of motion.     Cervical back: Normal range of motion.  Feet:     Right foot:     Skin integrity: Erythema (mild on top of foot where patient has mild swelling) present. No ulcer, blister, skin breakdown, warmth, dry skin or fissure.  Skin:    General: Skin is warm and dry.     Capillary Refill: Capillary refill takes less than 2 seconds.  Neurological:     General: No focal deficit present.     Mental Status: She is alert and oriented to person, place, and time. Mental status is at baseline.  Psychiatric:        Mood and Affect: Mood normal.        Behavior: Behavior normal.        Thought Content: Thought content normal.        Judgment:  Judgment normal.            [1]  Current Outpatient Medications:    acetaminophen  (TYLENOL ) 650 MG CR tablet, Take 1,300 mg by mouth every 8 (eight) hours as needed for pain., Disp: , Rfl:    albuterol  (VENTOLIN  HFA) 108 (90 Base) MCG/ACT inhaler, Inhale 2 puffs into the lungs every 6 (six) hours as needed for wheezing or shortness of breath., Disp: 8 g, Rfl: 1   cromolyn  (NASALCROM ) 5.2 MG/ACT nasal spray, Place 1 spray into both nostrils 4 (four) times daily., Disp: 26 mL, Rfl: 12   fluticasone  (FLONASE ) 50 MCG/ACT nasal spray, Place 2 sprays into both nostrils daily as needed for allergies. , Disp: , Rfl:    ibuprofen  (ADVIL ,MOTRIN ) 200 MG tablet, Take 200 mg by mouth every 8 (eight) hours as needed (pain.)., Disp: , Rfl:    loratadine (CLARITIN) 10 MG tablet, Take 10 mg by mouth daily as needed for allergies., Disp: , Rfl:    meloxicam  (MOBIC ) 7.5 MG tablet, Take 1-2 tablets (7.5-15 mg total) by mouth daily., Disp: 60 tablet, Rfl: 0   Misc. Devices (ROLLATOR ULTRA-LIGHT) MISC, 1 Device by Does not apply route once as needed for up to 1 dose. Rollator Walker with seat, Disp: 1 each, Rfl: 0   Multiple Vitamin (MULTIVITAMIN WITH MINERALS) TABS tablet, Take 1 tablet by mouth daily., Disp: , Rfl:    telmisartan -hydrochlorothiazide  (MICARDIS  HCT) 80-25 MG tablet, TAKE 1 TABLET DAILY (PLEASE CALL OUR OFFICE TO SCHEDULE A FOLLOW UP), Disp: 90 tablet, Rfl: 3   triamcinolone  cream (KENALOG ) 0.1 %, APPLY TO AFFECTED AREA TWICE A DAY, Disp: 100 g, Rfl: 3   TURMERIC CURCUMIN PO, Take 500 mg by mouth in the morning., Disp: , Rfl:  [2]  Allergies Allergen Reactions   Penicillins Hives    Has patient had a PCN reaction causing immediate rash, facial/tongue/throat swelling, SOB or lightheadedness with hypotension: Yes Has patient had a PCN reaction causing severe rash involving mucus membranes or skin necrosis: Yes Has patient had a PCN reaction that required hospitalization: Yes Has patient had a  PCN reaction occurring within the last 10 years: Yes If all of the above answers are NO, then may proceed with Cephalosporin use.    "

## 2024-07-30 ENCOUNTER — Encounter: Payer: Self-pay | Admitting: Family Medicine

## 2024-07-30 ENCOUNTER — Ambulatory Visit: Payer: Self-pay | Admitting: Family Medicine

## 2024-07-30 DIAGNOSIS — M109 Gout, unspecified: Secondary | ICD-10-CM | POA: Insufficient documentation

## 2024-07-30 DIAGNOSIS — M10071 Idiopathic gout, right ankle and foot: Secondary | ICD-10-CM

## 2024-08-04 ENCOUNTER — Ambulatory Visit
Admission: RE | Admit: 2024-08-04 | Discharge: 2024-08-04 | Disposition: A | Payer: Medicare (Managed Care) | Source: Ambulatory Visit | Attending: Internal Medicine | Admitting: Internal Medicine

## 2024-08-04 DIAGNOSIS — Z1231 Encounter for screening mammogram for malignant neoplasm of breast: Secondary | ICD-10-CM

## 2024-08-08 ENCOUNTER — Ambulatory Visit: Payer: Self-pay | Admitting: Internal Medicine

## 2024-11-07 ENCOUNTER — Encounter: Payer: Medicare (Managed Care) | Admitting: Internal Medicine

## 2024-11-07 ENCOUNTER — Ambulatory Visit: Payer: Medicare (Managed Care)
